# Patient Record
Sex: Male | Born: 1960 | Race: White | Hispanic: No | Marital: Single | Smoking: Former smoker
Health system: Southern US, Community
[De-identification: ages and names within clinical notes are randomized; demographics above are authoritative.]

## PROBLEM LIST (undated history)

## (undated) DIAGNOSIS — IMO0002 Reserved for concepts with insufficient information to code with codable children: Secondary | ICD-10-CM

## (undated) DIAGNOSIS — IMO0001 Reserved for inherently not codable concepts without codable children: Secondary | ICD-10-CM

## (undated) DIAGNOSIS — C801 Malignant (primary) neoplasm, unspecified: Secondary | ICD-10-CM

## (undated) HISTORY — PX: CYST REMOVAL NECK: SHX6281

## (undated) HISTORY — PX: HERNIA REPAIR: SHX51

---

## 2012-01-13 ENCOUNTER — Ambulatory Visit (INDEPENDENT_AMBULATORY_CARE_PROVIDER_SITE_OTHER): Payer: BC Managed Care – PPO | Admitting: Family Medicine

## 2012-01-13 VITALS — BP 100/63 | HR 55 | Temp 98.0°F | Resp 16 | Ht 71.38 in | Wt 167.0 lb

## 2012-01-13 DIAGNOSIS — R21 Rash and other nonspecific skin eruption: Secondary | ICD-10-CM

## 2012-01-13 DIAGNOSIS — L282 Other prurigo: Secondary | ICD-10-CM

## 2012-01-13 LAB — COMPREHENSIVE METABOLIC PANEL
ALT: 9 U/L (ref 0–53)
AST: 14 U/L (ref 0–37)
Albumin: 4.3 g/dL (ref 3.5–5.2)
Alkaline Phosphatase: 48 U/L (ref 39–117)
BUN: 14 mg/dL (ref 6–23)
CO2: 30 mEq/L (ref 19–32)
Calcium: 9.4 mg/dL (ref 8.4–10.5)
Chloride: 103 mEq/L (ref 96–112)
Creat: 0.87 mg/dL (ref 0.50–1.35)
Glucose, Bld: 93 mg/dL (ref 70–99)
Potassium: 4.7 mEq/L (ref 3.5–5.3)
Sodium: 137 mEq/L (ref 135–145)
Total Bilirubin: 0.4 mg/dL (ref 0.3–1.2)
Total Protein: 6.8 g/dL (ref 6.0–8.3)

## 2012-01-13 LAB — POCT CBC
Granulocyte percent: 67.8 %G (ref 37–80)
HCT, POC: 45 % (ref 43.5–53.7)
Hemoglobin: 14 g/dL — AB (ref 14.1–18.1)
Lymph, poc: 1.6 (ref 0.6–3.4)
MCH, POC: 30.7 pg (ref 27–31.2)
MCHC: 31.1 g/dL — AB (ref 31.8–35.4)
MCV: 98.6 fL — AB (ref 80–97)
MID (cbc): 0.6 (ref 0–0.9)
MPV: 9.9 fL (ref 0–99.8)
POC Granulocyte: 4.7 (ref 2–6.9)
POC LYMPH PERCENT: 23.9 %L (ref 10–50)
POC MID %: 8.3 %M (ref 0–12)
Platelet Count, POC: 204 10*3/uL (ref 142–424)
RBC: 4.56 M/uL — AB (ref 4.69–6.13)
RDW, POC: 14.5 %
WBC: 6.9 10*3/uL (ref 4.6–10.2)

## 2012-01-13 MED ORDER — DOXYCYCLINE HYCLATE 100 MG PO CAPS: 100.0000 mg | ORAL_CAPSULE | Freq: Two times a day (BID) | ORAL | Status: AC

## 2012-01-13 NOTE — Progress Notes (Signed)
  Subjective:    Patient ID: Bill Valdez, male    DOB: 1960-11-03, 51 y.o.   MRN: [id number]  HPI 51 year old male presents with 1 week history of a rash around his waist and ankles.  He does admit that he goes outside a lot.  Does not recall any tick bite, contact with poison ivy, or any other insect.  Says that the pruritis is improving but he is concerned because the rash on his right hip increased in size.  Denies headache, abdominal pain, nausea, vomiting, arthralgias, myalgias, fever, or chills.      Review of Systems  All other systems reviewed and are negative.       Objective:   Physical Exam  Constitutional: He is oriented to person, place, and time. He appears well-developed and well-nourished.  HENT:  Head: Normocephalic and atraumatic.  Right Ear: External ear normal.  Left Ear: External ear normal.  Eyes: Conjunctivae are normal.  Neck: Normal range of motion.  Cardiovascular: Normal rate, regular rhythm and normal heart sounds.   Pulmonary/Chest: Effort normal and breath sounds normal.  Neurological: He is alert and oriented to person, place, and time.  Skin:     Psychiatric: He has a normal mood and affect. His behavior is normal. Judgment and thought content normal.          Assessment & Plan:   1. Pruritic rash    2. Rash and nonspecific skin eruption  Rocky mtn spotted fvr ab, IgM-blood, Comprehensive metabolic panel, POCT CBC, B. burgdorfi antibodies, doxycycline (VIBRAMYCIN) 100 MG capsule  Will await RMSF and Lyme titers.  Begin doxycycline 100 mg bid x 14 days Follow up if no improvement or development of headache, nausea, vomiting, abdominal pain, fevers, or chills

## 2012-01-15 LAB — B. BURGDORFI ANTIBODIES: B burgdorferi Ab IgG+IgM: 0.36 {ISR}

## 2012-01-15 LAB — ROCKY MTN SPOTTED FVR AB, IGM-BLOOD: ROCKY MTN SPOTTED FEVER, IGM: 0.2 IV

## 2015-02-10 ENCOUNTER — Other Ambulatory Visit: Payer: Self-pay | Admitting: Family Medicine

## 2015-02-10 ENCOUNTER — Ambulatory Visit
Admission: RE | Admit: 2015-02-10 | Discharge: 2015-02-10 | Disposition: A | Discharge status: Home / Self Care | Payer: BLUE CROSS/BLUE SHIELD | Source: Ambulatory Visit | Attending: Family Medicine | Admitting: Family Medicine

## 2015-02-10 DIAGNOSIS — R0789 Other chest pain: Secondary | ICD-10-CM

## 2015-02-12 ENCOUNTER — Other Ambulatory Visit: Payer: Self-pay | Admitting: Family Medicine

## 2015-02-12 DIAGNOSIS — R918 Other nonspecific abnormal finding of lung field: Secondary | ICD-10-CM

## 2015-02-17 ENCOUNTER — Other Ambulatory Visit: Payer: BLUE CROSS/BLUE SHIELD

## 2015-02-18 ENCOUNTER — Ambulatory Visit
Admission: RE | Admit: 2015-02-18 | Discharge: 2015-02-18 | Disposition: A | Discharge status: Home / Self Care | Payer: BLUE CROSS/BLUE SHIELD | Source: Ambulatory Visit | Attending: Family Medicine | Admitting: Family Medicine

## 2015-02-18 ENCOUNTER — Other Ambulatory Visit (HOSPITAL_COMMUNITY): Payer: Self-pay | Admitting: Family Medicine

## 2015-02-18 DIAGNOSIS — R918 Other nonspecific abnormal finding of lung field: Secondary | ICD-10-CM

## 2015-02-18 DIAGNOSIS — C3411 Malignant neoplasm of upper lobe, right bronchus or lung: Secondary | ICD-10-CM

## 2015-02-18 MED ORDER — IOPAMIDOL (ISOVUE-300) INJECTION 61%
75.0000 mL | Freq: Once | INTRAVENOUS | Status: AC | PRN
  Administered 2015-02-18: 75 mL via INTRAVENOUS

## 2015-02-19 ENCOUNTER — Encounter: Payer: Self-pay | Admitting: *Deleted

## 2015-02-19 ENCOUNTER — Ambulatory Visit
Admission: RE | Admit: 2015-02-19 | Discharge: 2015-02-19 | Disposition: A | Discharge status: Home / Self Care | Payer: BLUE CROSS/BLUE SHIELD | Source: Ambulatory Visit | Attending: Radiation Oncology | Admitting: Radiation Oncology

## 2015-02-19 ENCOUNTER — Encounter: Payer: Self-pay | Admitting: Radiation Oncology

## 2015-02-19 ENCOUNTER — Other Ambulatory Visit: Payer: Self-pay | Admitting: Internal Medicine

## 2015-02-19 ENCOUNTER — Ambulatory Visit (HOSPITAL_BASED_OUTPATIENT_CLINIC_OR_DEPARTMENT_OTHER): Payer: BLUE CROSS/BLUE SHIELD | Admitting: Internal Medicine

## 2015-02-19 ENCOUNTER — Telehealth: Payer: Self-pay | Admitting: Internal Medicine

## 2015-02-19 ENCOUNTER — Telehealth: Payer: Self-pay | Admitting: *Deleted

## 2015-02-19 VITALS — BP 105/71 | HR 91 | Temp 97.6°F | Ht 71.0 in | Wt 163.2 lb

## 2015-02-19 VITALS — BP 137/84 | HR 102 | Resp 18 | Ht 71.0 in | Wt 165.4 lb

## 2015-02-19 DIAGNOSIS — C3411 Malignant neoplasm of upper lobe, right bronchus or lung: Secondary | ICD-10-CM

## 2015-02-19 DIAGNOSIS — Z809 Family history of malignant neoplasm, unspecified: Secondary | ICD-10-CM | POA: Insufficient documentation

## 2015-02-19 DIAGNOSIS — Z510 Encounter for antineoplastic radiation therapy: Secondary | ICD-10-CM | POA: Diagnosis not present

## 2015-02-19 NOTE — Progress Notes (Signed)
Radiation Oncology         [phone] ________________________________  Initial Outpatient Consultation  Name: Bill Valdez MRN: [id number]  Date: 02/19/2015  DOB: Nov 24, 1960  GE:XBMWUXL,KGMWNU R, MD  Curt Bears, MD   REFERRING PHYSICIAN: Curt Bears, MD  DIAGNOSIS: The encounter diagnosis was Cancer of upper lobe of right lung.    ICD-9-CM ICD-10-CM   1. Cancer of upper lobe of right lung 162.3 C34.11     HISTORY OF PRESENT ILLNESS:[name] is a 54 y.o. 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 He was noticing with exertion worsening of a hernia and also began to develop rib pain. He underwent chest x-ray on 02/10/2015. This study demonstrated a nodular 6cm right upper lobe suprahilar mass. This prompted chest CT with contrast on 02/18/2015. The chest CT confirmed a 7.7cm large central right lung mass invading the mediastinum with bulky adenopathy and obstruction of the SVC. The CT scan showed hypo-densities within the liver, highly suspicious for liver metastases.The patient was urgently referred to medical oncology earlier today (02/19/2015) and sent down for emergent evaluation. The patient does note some facial swelling as well as dyspnea. This symptom worsens when he bends forwards or ties his shoes.  PREVIOUS RADIATION THERAPY: No  PAST MEDICAL HISTORY:  has no past medical history on file.    PAST SURGICAL HISTORY: Past Surgical History  Procedure Laterality Date  . Hernia repair    . Cyst removal neck      FAMILY HISTORY: family history includes Cancer in his father.  SOCIAL HISTORY:  Social History   Social History  . Marital Status: Single    Spouse Name: N/A  . Number of Children: 0  . Years of Education: N/A   Occupational History  . Not on file.    Social History Main Topics  . Smoking status: Former Smoker -- 1.50 packs/day for 30 years    Types: Cigarettes    Quit date: 01/13/2011  . Smokeless tobacco: Former Systems developer  . Alcohol Use: No  . Drug Use: No  . Sexual Activity: Not on file   Other Topics Concern  . Not on file   Social History Narrative    ALLERGIES: Penicillins  MEDICATIONS:  No current outpatient prescriptions on file.   No current facility-administered medications for this encounter.    REVIEW OF SYSTEMS:  A 15 point review of systems is documented in the electronic medical record. This was obtained by the nursing staff. However, I reviewed this with the patient to discuss relevant findings and make appropriate changes.  Pertinent items are noted in HPI.   PHYSICAL EXAM:  height is '5\' 11"'$  (1.803 m) and weight is 163 lb 3.2 oz (74.027 kg). His oral temperature is 97.6 F (36.4 C). His blood pressure is 105/71 and his pulse is 91. His oxygen saturation is 99%.  The patient is alert and oriented x3. BP 105/71 mmHg  Pulse 91  Temp(Src) 97.6 F (36.4 C) (Oral)  Ht '5\' 11"'$  (1.803 m)  Wt 163 lb 3.2 oz (74.027 kg)  BMI 22.77 kg/m2  SpO2 99% The patient is no acute distress today or respiratory distress. He may have some minimal facial swelling but no jugular venous distention.  KPS = 90  100 - Normal; no complaints; no evidence of disease. 90   -  Able to carry on normal activity; minor signs or symptoms of disease. 80   - Normal activity with effort; some signs or symptoms of disease. 26   - Cares for self; unable to carry on normal activity or to do active work. 60   - Requires occasional assistance, but is able to care for most of his personal needs. 50   - Requires considerable assistance and frequent medical care. 5   - Disabled; requires special care and assistance. 90   - Severely disabled; hospital admission is indicated although death not imminent. 58   - Very sick; hospital admission necessary;  active supportive treatment necessary. 10   - Moribund; fatal processes progressing rapidly. 0     - Dead  Karnofsky DA, Abelmann [place], Craver LS and Burchenal [facility] [phone]) The use of the nitrogen mustards in the palliative treatment of carcinoma: with particular reference to bronchogenic carcinoma Cancer 1 634-56  LABORATORY DATA:  Lab Results  Component Value Date   WBC 6.9 01/13/2012   HGB 14.0* 01/13/2012   HCT 45.0 01/13/2012   MCV 98.6* 01/13/2012   Lab Results  Component Value Date   NA 137 01/13/2012   K 4.7 01/13/2012   CL 103 01/13/2012   CO2 30 01/13/2012   Lab Results  Component Value Date   ALT 9 01/13/2012   AST 14 01/13/2012   ALKPHOS 48 01/13/2012   BILITOT 0.4 01/13/2012     RADIOGRAPHY: Dg Chest 2 View  02/11/2015   CLINICAL DATA:  Chest pain starting 4 weeks ago  EXAM: CHEST  2 VIEW  COMPARISON:  None.  FINDINGS: Cardiac size is unremarkable. No acute infiltrate or pulmonary edema. There is a nodular mass in right upper lobe suprahilar region measures at least 6 cm. Further correlation with CT scan of the chest with IV contrast is recommended. Minimal compression deformity lower thoracic spine of indeterminate age.  IMPRESSION: No acute infiltrate or pulmonary edema. There is a nodular mass in right upper lobe suprahilar region measures at least 6 cm. Further correlation with CT scan of the chest with IV contrast is recommended. Minimal compression deformity lower thoracic spine of indeterminate age.  This was made a call report.   Electronically Signed   By: Lahoma Crocker M.D.   On: 02/11/2015 08:08   Ct Chest W Contrast  02/18/2015   CLINICAL DATA:  Followup lung mass seen on recent chest x-ray.  EXAM: CT CHEST WITH CONTRAST  TECHNIQUE: Multidetector CT imaging of the chest was performed during intravenous contrast administration.  CONTRAST:  90m ISOVUE-300 IOPAMIDOL (ISOVUE-300) INJECTION 61%  COMPARISON:  Chest x-ray 02/10/2015  FINDINGS: Chest wall: No chest wall  mass. There are borderline enlarged axillary lymph nodes. A 9 mm supraclavicular lymph node is noted on the right on image number 10. A 9 mm right axillary lymph node is noted on image number 17. Small subpectoral lymph nodes are also noted on the right side.  No destructive bone lesions are identified. No sternal, rib or vertebral body lesions. Seventh rib fractures are noted bilaterally which are likely posttraumatic. I do not see any definite underlying pathologic bone lesion. A T12 vertebral body lesion is most likely a benign Schmorl's node or old compression fracture.  Mediastinum: Large central right lung mass invading the mediastinum with bulky adenopathy. There is obstruction and probable invasion of the SVC and tumor or tumor thrombus extending up into the right uvular vein and left brachiocephalic vein. There is also clot or  tumor in the azygos vein. The mass measures approximately 7.7 x 7.6 cm on image number 26. 11 mm subcarinal lymph node is noted. No contralateral hilar adenopathy. The heart is normal in size. No pericardial effusion. The aorta is normal in caliber. No dissection. The branch vessels are patent. The esophagus is grossly normal.  Lungs/pleura: Apical scarring changes with a 12 mm nodular density on image number 14 in the right apex. No findings for pulmonary metastatic disease. No pleural effusion or acute pulmonary abnormality. Mild underlying emphysematous changes.  Upper abdomen: Low-attenuation liver lesions highly concerning for metastatic disease.  15 mm lesion in segment 3 on image number 68.  10 mm lesion in segment 2 B on image 75.  3.5 cm lesion and segment 5 on image number 66  Other smaller lesions are noted.  No metastatic adrenal gland lesions are identified. The visualized pancreas is normal.  IMPRESSION: 1. 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 This is invading the SVC with probable tumor thrombus  extending up the right jugular vein and into the left brachiocephalic vein. There is also thrombus or tumor in the azygos vein. 2. Borderline enlarged supraclavicular and axillary lymph nodes on the right side. 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 3. Findings highly suspicious for liver metastasis. 4. No obvious bone metastasis. Bilateral seventh rib fractures are noted. These results were called by telephone at the time of interpretation on 02/18/2015 at 4:53 pm to Dr. Donavan Burnet   Electronically Signed   By: Marijo Sanes M.D.   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 He may benefit from emergent institution of palliative ative radiotherapy to the upper mediastinum. The patient understands that he can access his appointments and medical records via [place].   PLAN: Today, I talked to the patient and family about the findings and work-up thus far.  We discussed the natural history of superior vena cava syndrome and general treatment, highlighting the role of radiotherapy in the management.  We discussed the available radiation techniques, and focused on the details of logistics and delivery.  We reviewed the anticipated acute and late sequelae associated with radiation in this setting.  The patient was encouraged to ask questions that I answered to the best of my ability.  I filled out a patient counseling form during our discussion including treatment diagrams.  We retained a copy for our records.  The patient would like to proceed with radiation and will be scheduled for CT simulation later today (02/19/2015). We plan a course of six fractions to complete radiotherapy on 02/26/2015. All vocalized questions and concerns have been addressed. If the patient develops any  further questions or concerns in regards to his treatment and recovery, he has been encouraged to contact Dr. Tammi Klippel, MD.  I spent time face to face with the patient and more than 50% of that time was spent in counseling and/or coordination of care.   This document serves as a record of services personally performed by Tyler Pita, MD. It was created on his behalf by Lenn Cal, a trained medical scribe. The creation of this record is based on the scribe's personal observations and the provider's statements  to them. This document has been checked and approved by the attending provider.   ------------------------------------------------  Sheral Apley Tammi Klippel, M.D.

## 2015-02-19 NOTE — Progress Notes (Signed)
  Radiation Oncology         [phone] ________________________________  Name: Bill Valdez MRN: [id number]  Date: 02/19/2015  DOB: 25-Sep-1960  SIMULATION AND TREATMENT PLANNING NOTE    ICD-9-CM ICD-10-CM   1. 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  NARRATIVE:  The patient was brought to the [facility].  Identity was confirmed.  All relevant records and images related to the planned course of therapy were reviewed.  The patient freely provided informed written consent to proceed with treatment after reviewing the details related to the planned course of therapy. The consent form was witnessed and verified by the simulation staff.  Then, the patient was set-up in a stable reproducible  supine position for radiation therapy.  CT images were obtained.  Surface markings were placed.  The CT images were loaded into the planning software.  Then the target and avoidance structures were contoured.  Treatment planning then occurred.  The radiation prescription was entered and confirmed.  Then, I designed and supervised the construction of a total of 3 medically necessary complex treatment devices in the form of a body fix positioner and two multileaf collimators.  I have requested : 3D Simulation  I have requested a DVH of the following structures: left lung, right lung, heart, esophagus, spinal chord, and target.  PLAN:  The patient will receive 4 Gy in 1 fraction today to initiate a rapid response. We will continue with 5 more fractions of 3 Gy next week to deliver a total dose of 19 Gy with treatment completion on 02/26/2015.  This document serves as a record of services personally performed by Tyler Pita, MD. It was created on his behalf by Lenn Cal, a trained medical scribe. The creation of this record is based on the scribe's personal  observations and the provider's statements to them. This document has been checked and approved by the attending provider. ________________________________   Sheral Apley. Tammi Klippel, M.D.

## 2015-02-19 NOTE — Progress Notes (Signed)
Oncology Nurse Navigator Documentation  Oncology Nurse Navigator Flowsheets 02/19/2015  Navigator Encounter Type Clinic/MDC/spoke with patient today at his new patient visit.  I followed up with him regarding information given to him by Dr. Julien Nordmann.  He is set up to see Dr. Julien Nordmann on 02/25/15 for follow up arrive at 4:30.    Patient Visit Type Initial  Treatment Phase Abnormal Scans  Barriers/Navigation Needs Education  Education Other  Interventions Coordination of Care  Coordination of Care -  Time Spent with Patient 30

## 2015-02-19 NOTE — Progress Notes (Signed)
[place] [phone]   [phone]  CONSULT NOTE  REFERRING PHYSICIAN: Dr. Donnie Coffin  REASON FOR CONSULTATION:  54 years old white male with questionable lung cancer.  HPI Bill Valdez is a 54 y.o. male with no significant past medical history except for history of hernia repair 3 years ago as well as history of pneumonia many years ago. The patient also has a history of smoking but quit in 2012. Over the last 2 months the patient has been complaining of pain on the left lower side of his chest that lasted for almost a week but got better on its own. The patient then started having increasing fatigue and shortness of breath. He was seen by his primary care physician and complained about these issues. Chest x-ray was performed on 02/10/2015 and it showed a nodular mass in the right upper lobe suprahilar region measuring at least 6 cm. This was followed by CT scan of the chest on 02/18/2015 and it showed 7.0 cm central right upper lobe lung mass invading the mediastinum highly suspicious for small cell lung cancer. This is invading the SVC with probable tumor thrombus extending up the right jugular vein and into the left brachiocephalic vein. There was also a thrombus or tumor and the azygos vein. There was borderline enlarged supraclavicular and axillary lymph nodes on the right side. 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 There are also findings highly suspicious for liver metastasis. There was no obvious bone metastases but there was bilateral 7th rib fractures. Dr. Alroy Dust kindly referred the patient to me today for evaluation and recommendation regarding his condition. When seen today the patient continues to have cough and central chest pain as well as shortness of breath increased with exertion. He noticed also pressure and his face and neck area as well as swelling of the neck over the  last few days. He continues to complain of arthralgia and his knees. He denied having any significant weight loss or night sweats. He has few episodes of presyncope.   HPI  PAST MEDICAL HISTORY: Remarkable only for pneumonia many years ago and addition to hernia surgery 3 years ago and cyst removed from the neck area. The patient denied having any history of hypertension, diabetes mellitus, coronary artery disease or stroke.   FAMILY HISTORY: Father had liver cancer and mother had back injury. No history of malignancy in his family.   SOCIAL HISTORY: The patient is single and has no children. He moved from [place] to be close to family in [place]. 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 He has a history of smoking up to 2 pack per day for around 30 years and quit 01/14/2011. 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 No history of drug abuse.   Social History Social History  Substance Use Topics  . Smoking status: Former Smoker    Quit date: 01/13/2011  . Smokeless tobacco: Not on file  . Alcohol Use: Not on file    Allergies  Allergen Reactions  . Penicillins Other (See Comments)    childhood    No current outpatient prescriptions on file.   No current facility-administered medications for this visit.    Review of Systems  Constitutional: negative Eyes: negative Ears, nose, mouth, throat, and face: positive for Swelling of the neck Respiratory: positive for cough and  dyspnea on exertion Cardiovascular: negative Gastrointestinal: negative Genitourinary:negative Integument/breast: negative Hematologic/lymphatic: negative Musculoskeletal:positive for arthralgias Neurological: negative Behavioral/Psych: negative Endocrine: negative Allergic/Immunologic: negative  Physical Exam  FBP:ZWCHE, healthy, no distress, well nourished and well developed SKIN: skin color, texture, turgor are normal,  no rashes or significant lesions HEAD: Normocephalic, No masses, lesions, tenderness or abnormalities EYES: normal, PERRLA, Conjunctiva are pink and non-injected EARS: External ears normal, Canals clear OROPHARYNX:no exudate, no erythema and lips, buccal mucosa, and tongue normal  NECK: supple, no adenopathy, no JVD LYMPH:  no palpable lymphadenopathy, no hepatosplenomegaly LUNGS: expiratory wheezes bilaterally, scattered rhonchi bilaterally HEART: regular rate & rhythm, no murmurs and no gallops ABDOMEN:abdomen soft, non-tender, normal bowel sounds and no masses or organomegaly BACK: Back symmetric, no curvature., No CVA tenderness EXTREMITIES:no joint deformities, effusion, or inflammation, no edema, no skin discoloration  NEURO: alert & oriented x 3 with fluent speech, no focal motor/sensory deficits  PERFORMANCE STATUS: ECOG 0  LABORATORY DATA: Lab Results  Component Value Date   WBC 6.9 01/13/2012   HGB 14.0* 01/13/2012   HCT 45.0 01/13/2012   MCV 98.6* 01/13/2012      Chemistry      Component Value Date/Time   NA 137 01/13/2012 1052   K 4.7 01/13/2012 1052   CL 103 01/13/2012 1052   CO2 30 01/13/2012 1052   BUN 14 01/13/2012 1052   CREATININE 0.87 01/13/2012 1052      Component Value Date/Time   CALCIUM 9.4 01/13/2012 1052   ALKPHOS 48 01/13/2012 1052   AST 14 01/13/2012 1052   ALT 9 01/13/2012 1052   BILITOT 0.4 01/13/2012 1052       RADIOGRAPHIC STUDIES: Dg Chest 2 View  02/11/2015   CLINICAL DATA:  Chest pain starting 4 weeks ago  EXAM: CHEST  2 VIEW  COMPARISON:  None.  FINDINGS: Cardiac size is unremarkable. No acute infiltrate or pulmonary edema. There is a nodular mass in right upper lobe suprahilar region measures at least 6 cm. Further correlation with CT scan of the chest with IV contrast is recommended. Minimal compression deformity lower thoracic spine of indeterminate age.  IMPRESSION: No acute infiltrate or pulmonary edema. There is a nodular mass in  right upper lobe suprahilar region measures at least 6 cm. Further correlation with CT scan of the chest with IV contrast is recommended. Minimal compression deformity lower thoracic spine of indeterminate age.  This was made a call report.   Electronically Signed   By: Lahoma Crocker M.D.   On: 02/11/2015 08:08   Ct Chest W Contrast  02/18/2015   CLINICAL DATA:  Followup lung mass seen on recent chest x-ray.  EXAM: CT CHEST WITH CONTRAST  TECHNIQUE: Multidetector CT imaging of the chest was performed during intravenous contrast administration.  CONTRAST:  50m ISOVUE-300 IOPAMIDOL (ISOVUE-300) INJECTION 61%  COMPARISON:  Chest x-ray 02/10/2015  FINDINGS: Chest wall: No chest wall mass. There are borderline enlarged axillary lymph nodes. A 9 mm supraclavicular lymph node is noted on the right on image number 10. A 9 mm right axillary lymph node is noted on image number 17. Small subpectoral lymph nodes are also noted on the right side.  No destructive bone lesions are identified. No sternal, rib or vertebral body lesions. Seventh rib fractures are noted bilaterally which are likely posttraumatic. I do not see any definite underlying pathologic bone lesion. A T12 vertebral body lesion is most likely a benign Schmorl's node or old compression fracture.  Mediastinum: Large central right lung  mass invading the mediastinum with bulky adenopathy. There is obstruction and probable invasion of the SVC and tumor or tumor thrombus extending up into the right uvular vein and left brachiocephalic vein. There is also clot or tumor in the azygos vein. The mass measures approximately 7.7 x 7.6 cm on image number 26. 11 mm subcarinal lymph node is noted. No contralateral hilar adenopathy. The heart is normal in size. No pericardial effusion. The aorta is normal in caliber. No dissection. The branch vessels are patent. The esophagus is grossly normal.  Lungs/pleura: Apical scarring changes with a 12 mm nodular density on image number  14 in the right apex. No findings for pulmonary metastatic disease. No pleural effusion or acute pulmonary abnormality. Mild underlying emphysematous changes.  Upper abdomen: Low-attenuation liver lesions highly concerning for metastatic disease.  15 mm lesion in segment 3 on image number 68.  10 mm lesion in segment 2 B on image 75.  3.5 cm lesion and segment 5 on image number 66  Other smaller lesions are noted.  No metastatic adrenal gland lesions are identified. The visualized pancreas is normal.  IMPRESSION: 1. 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 This is invading the SVC with probable tumor thrombus extending up the right jugular vein and into the left brachiocephalic vein. There is also thrombus or tumor in the azygos vein. 2. Borderline enlarged supraclavicular and axillary lymph nodes on the right side. 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 3. Findings highly suspicious for liver metastasis. 4. No obvious bone metastasis. Bilateral seventh rib fractures are noted. These results were called by telephone at the time of interpretation on 02/18/2015 at 4:53 pm to Dr. Donavan Burnet   Electronically Signed   By: Marijo Sanes M.D.   On: 02/18/2015 16:54    ASSESSMENT: This is a very pleasant 54 years old white male with highly suspicious extensive stage small cell lung cancer pending tissue diagnosis but non-small cell lung cancer is also consideration, presenting with large right upper lobe suprahilar mass in addition to mediastinal lymphadenopathy and highly suspicious liver metastasis.   PLAN: I had a lengthy discussion with the patient today about his current condition. I showed him the images of the CT scan of the chest. We will complete the staging workup by ordering a PET scan in addition to MRI of the brain. I also referred the patient to interventional  radiology for consideration of ultrasound guided core biopsy of one of the liver lesion for tissue diagnosis. This is expected to be performed on 02/22/2015 by Dr. Kathlene Cote. I also spoke to Dr. Tammi Klippel and he kindly agreed to see the patient today for evaluation and consideration of palliative radiotherapy to the large right upper lobe suprahilar mass for relief of the SVC symptoms. I will arrange for the patient to come back for follow-up visit in one week for reevaluation and more detailed discussion of his treatment options based on the final pathology and staging workup. He was advised to call immediately if he has any concerning symptoms in the interval.  The patient voices understanding of current disease status and treatment options and is in agreement with the current care plan.  All questions were answered. The patient knows to call the clinic with any problems, questions or concerns. We can certainly see the patient much sooner if necessary.  Thank you so much for  allowing me to participate in the care of Deanna Artis. I will continue to follow up the patient with you and assist in his care.  I spent 40 minutes counseling the patient face to face. The total time spent in the appointment was 60 minutes.  Disclaimer: This note was dictated with voice recognition software. Similar sounding words can inadvertently be transcribed and may not be corrected upon review.   MOHAMED,MOHAMED K. February 19, 2015, 11:56 AM

## 2015-02-19 NOTE — Progress Notes (Signed)
Please see the Nurse Progress Note in the MD Initial Consult Encounter for this patient. 

## 2015-02-19 NOTE — Telephone Encounter (Signed)
Oncology Nurse Navigator Documentation  Oncology Nurse Navigator Flowsheets 02/19/2015  Referral date to RadOnc/MedOnc 02/19/2015  Navigator Encounter Type Introductory phone call/I received a referral on Mr. Graziani today.  I called to schedule.  He will be seen 02/25/15 arrive at 3:15 after PET scan.  Patient verbalized understanding of appt time and place.   Interventions Coordination of Care  Coordination of Care MD Appointments  Time Spent with Patient 15

## 2015-02-19 NOTE — Progress Notes (Signed)
Thoracic Location of Tumor / Histology: right lung cancer  Patient presented 2 months ago with symptoms of: sharp pain with deep breaths and feeling short of breath.  This lasted for a few days and started to get better.    Tobacco/Marijuana/Snuff/ETOH use: smoked 1.5 - 2 packs a day for 30 years.  Quit in 2012. Deneis etoh, snuff, marijuana use.  Past/Anticipated interventions by cardiothoracic surgery, if any: no  Past/Anticipated interventions by medical oncology, if any: yes with have chemotherapy.  Signs/Symptoms  Weight changes, if any: no  Respiratory complaints, if any: denies shortness of breath, reports having a cough with yellow sputum.  Hemoptysis, if any: no  Pain issues, if any:  Yes - has occasional burning pain in his mid chest, left side that lasts for 20 minutes at a time.  Says this is occuring 2 times per day.  SAFETY ISSUES:  Prior radiation? no  Pacemaker/ICD? no   Possible current pregnancy?no  Is the patient on methotrexate? no  Current Complaints / other details:  Patient went to his family doctor about a hernia issues.  The lung mass was found on a chest x ray and then CT scan.  BP 105/71 mmHg  Pulse 91  Temp(Src) 97.6 F (36.4 C) (Oral)  Ht '5\' 11"'$  (1.803 m)  Wt 163 lb 3.2 oz (74.027 kg)  BMI 22.77 kg/m2  SpO2 99%

## 2015-02-19 NOTE — Telephone Encounter (Signed)
per pof to sch pt appt-per Hinton Dyer she will sch [facility] and [facility]amed MD & lab appt-adv pt central sch will call to sch scans-pt understood

## 2015-02-20 ENCOUNTER — Encounter: Payer: Self-pay | Admitting: Internal Medicine

## 2015-02-21 ENCOUNTER — Other Ambulatory Visit: Payer: Self-pay | Admitting: Radiology

## 2015-02-22 ENCOUNTER — Telehealth: Payer: Self-pay | Admitting: Medical Oncology

## 2015-02-22 ENCOUNTER — Encounter (HOSPITAL_COMMUNITY): Payer: Self-pay

## 2015-02-22 ENCOUNTER — Ambulatory Visit (HOSPITAL_COMMUNITY)
Admission: RE | Admit: 2015-02-22 | Discharge: 2015-02-22 | Disposition: A | Discharge status: Home / Self Care | Payer: BLUE CROSS/BLUE SHIELD | Source: Ambulatory Visit | Attending: Internal Medicine | Admitting: Internal Medicine

## 2015-02-22 ENCOUNTER — Ambulatory Visit
Admission: RE | Admit: 2015-02-22 | Discharge: 2015-02-22 | Disposition: A | Discharge status: Home / Self Care | Payer: BLUE CROSS/BLUE SHIELD | Source: Ambulatory Visit | Attending: Radiation Oncology | Admitting: Radiation Oncology

## 2015-02-22 ENCOUNTER — Other Ambulatory Visit: Payer: Self-pay | Admitting: Medical Oncology

## 2015-02-22 ENCOUNTER — Other Ambulatory Visit: Payer: Self-pay | Admitting: Radiology

## 2015-02-22 DIAGNOSIS — Z87891 Personal history of nicotine dependence: Secondary | ICD-10-CM | POA: Diagnosis not present

## 2015-02-22 DIAGNOSIS — Z880 Allergy status to penicillin: Secondary | ICD-10-CM | POA: Diagnosis not present

## 2015-02-22 DIAGNOSIS — C801 Malignant (primary) neoplasm, unspecified: Secondary | ICD-10-CM | POA: Diagnosis not present

## 2015-02-22 DIAGNOSIS — K769 Liver disease, unspecified: Secondary | ICD-10-CM | POA: Diagnosis present

## 2015-02-22 DIAGNOSIS — Z510 Encounter for antineoplastic radiation therapy: Secondary | ICD-10-CM | POA: Diagnosis not present

## 2015-02-22 DIAGNOSIS — C3411 Malignant neoplasm of upper lobe, right bronchus or lung: Secondary | ICD-10-CM

## 2015-02-22 DIAGNOSIS — C787 Secondary malignant neoplasm of liver and intrahepatic bile duct: Secondary | ICD-10-CM | POA: Insufficient documentation

## 2015-02-22 DIAGNOSIS — R599 Enlarged lymph nodes, unspecified: Secondary | ICD-10-CM | POA: Diagnosis not present

## 2015-02-22 DIAGNOSIS — R918 Other nonspecific abnormal finding of lung field: Secondary | ICD-10-CM | POA: Diagnosis not present

## 2015-02-22 LAB — CBC
HEMATOCRIT: 38.3 % — AB (ref 39.0–52.0)
HEMOGLOBIN: 12.7 g/dL — AB (ref 13.0–17.0)
MCH: 30.5 pg (ref 26.0–34.0)
MCHC: 33.2 g/dL (ref 30.0–36.0)
MCV: 92.1 fL (ref 78.0–100.0)
PLATELETS: 271 10*3/uL (ref 150–400)
RBC: 4.16 MIL/uL — AB (ref 4.22–5.81)
RDW: 12.7 % (ref 11.5–15.5)
WBC: 9.7 10*3/uL (ref 4.0–10.5)

## 2015-02-22 LAB — APTT: APTT: 33 s (ref 24–37)

## 2015-02-22 LAB — PROTIME-INR
INR: 1.22 (ref 0.00–1.49)
PROTHROMBIN TIME: 15.6 s — AB (ref 11.6–15.2)

## 2015-02-22 MED ORDER — GELATIN ABSORBABLE 12-7 MM EX MISC
CUTANEOUS | Status: AC
  Filled 2015-02-22: qty 1

## 2015-02-22 MED ORDER — FENTANYL CITRATE (PF) 100 MCG/2ML IJ SOLN
INTRAMUSCULAR | Status: AC | PRN
Start: 2015-02-22 — End: 2015-02-22
  Administered 2015-02-22: 50 ug via INTRAVENOUS

## 2015-02-22 MED ORDER — SODIUM CHLORIDE 0.9 % IV SOLN
INTRAVENOUS | Status: DC
  Administered 2015-02-22: 14:00:00 via INTRAVENOUS

## 2015-02-22 MED ORDER — MIDAZOLAM HCL 2 MG/2ML IJ SOLN
INTRAMUSCULAR | Status: AC
  Filled 2015-02-22: qty 2

## 2015-02-22 MED ORDER — FENTANYL CITRATE (PF) 100 MCG/2ML IJ SOLN
INTRAMUSCULAR | Status: AC
  Filled 2015-02-22: qty 2

## 2015-02-22 MED ORDER — LIDOCAINE HCL (PF) 1 % IJ SOLN
INTRAMUSCULAR | Status: AC
  Filled 2015-02-22: qty 10

## 2015-02-22 MED ORDER — MIDAZOLAM HCL 2 MG/2ML IJ SOLN
INTRAMUSCULAR | Status: AC | PRN
  Administered 2015-02-22: 2 mg via INTRAVENOUS

## 2015-02-22 NOTE — Telephone Encounter (Signed)
I called central scheduling and they wil schedule MRI . Dr Towana Badger notified.

## 2015-02-22 NOTE — Discharge Instructions (Signed)
Liver Biopsy, Care After °Refer to this sheet in the next few weeks. These instructions provide you with information on caring for yourself after your procedure. Your health care provider may also give you more specific instructions. Your treatment has been planned according to current medical practices, but problems sometimes occur. Call your health care provider if you have any problems or questions after your procedure. °WHAT TO EXPECT AFTER THE PROCEDURE °After your procedure, it is typical to have the following: °· A small amount of discomfort in the area where the biopsy was done and in the right shoulder or shoulder blade. °· A small amount of bruising around the area where the biopsy was done and on the skin over the liver. °· Sleepiness and fatigue for the rest of the day. °HOME CARE INSTRUCTIONS  °· Rest at home for 1-2 days or as directed by your health care provider. °· Have a friend or family member stay with you for at least 24 hours. °· Because of the medicines used during the procedure, you should not do the following things in the first 24 hours: °¨ Drive. °¨ Use machinery. °¨ Be responsible for the care of other people. °¨ Sign legal documents. °¨ Take a bath or shower. °· There are many different ways to close and cover an incision, including stitches, skin glue, and adhesive strips. Follow your health care provider's instructions on: °¨ Incision care. °¨ Bandage (dressing) changes and removal. °¨ Incision closure removal. °· Do not drink alcohol in the first week. °· Do not lift more than 5 pounds or play contact sports for 2 weeks after this test. °· Take medicines only as directed by your health care provider. Do not take medicine containing aspirin or non-steroidal anti-inflammatory medicines such as ibuprofen for 1 week after this test. °· It is your responsibility to get your test results. °SEEK MEDICAL CARE IF:  °· You have increased bleeding from an incision that results in more than a  small spot of blood. °· You have redness, swelling, or increasing pain in any incisions. °· You notice a discharge or a bad smell coming from any of your incisions. °· You have a fever or chills. °SEEK IMMEDIATE MEDICAL CARE IF:  °· You develop swelling, bloating, or pain in your abdomen. °· You become dizzy or faint. °· You develop a rash. °· You are nauseous or vomit. °· You have difficulty breathing, feel short of breath, or feel faint. °· You develop chest pain. °· You have problems with your speech or vision. °· You have trouble balancing or moving your arms or legs. °Document Released: 12/09/2004 Document Revised: 10/06/2013 Document Reviewed: 07/18/2013 °[employer]® Patient Information ©2015 [employer]. This information is not intended to replace advice given to you by your health care provider. Make sure you discuss any questions you have with your health care provider. ° °

## 2015-02-22 NOTE — Procedures (Signed)
Interventional Radiology Procedure Note  Procedure: US guided biopsy of left liver mass.  4 x 18g biopsy to pathology.  Complications: None Recommendations:  - Ok to shower tomorrow - Do not submerge for 7 days - Routine care   Signed,  Dulcy Fanny. Earleen Newport, DO

## 2015-02-22 NOTE — H&P (Signed)
Chief Complaint: Patient was seen in consultation today for liver lesion at the request of [facility]  Referring Physician(s): Mohamed,Mohamed  History of Present Illness: Bill Valdez is a 54 y.o. male who presented on 02/10/15 with complaints of intermittent chest pain approximately 1 month ago CXR revealed RUL lung mass. Follow up CT 02/18/15 with RUL lung mass invading the SVC with thrombus present, lymphadenopathy and liver metastatic disease. The patient has started radiation and has seen Dr. Julien Nordmann and is here today for a liver lesion biopsy with sedation. He denies any chest pain, shortness of breath or palpitations. He denies any active signs of bleeding or excessive bruising. He denies any RUQ pain or nausea. He denies any recent fever or chills. The patient denies any history of sleep apnea or chronic oxygen use. He denies any known complications to sedation.    History reviewed. No pertinent past medical history.  Past Surgical History  Procedure Laterality Date  . Hernia repair    . Cyst removal neck      Allergies: Penicillins  Medications: Prior to Admission medications   Not on File     Family History  Problem Relation Age of Onset  . Cancer Father     liver/kidney    Social History   Social History  . Marital Status: Single    Spouse Name: N/A  . Number of Children: 0  . Years of Education: N/A   Social History Main Topics  . Smoking status: Former Smoker -- 1.50 packs/day for 30 years    Types: Cigarettes    Quit date: 01/13/2011  . Smokeless tobacco: Former Systems developer  . Alcohol Use: No  . Drug Use: No  . Sexual Activity: Not Asked   Other Topics Concern  . None   Social History Narrative   Review of Systems: A 12 point ROS discussed and pertinent positives are indicated in the HPI above.  All other systems are negative.  Review of Systems  Vital Signs: T: 97.6 F, BP: 105/71 mmHg, HR: 91 bpm, O2: 99% RA  Physical Exam    Constitutional: He is oriented to person, place, and time. No distress.  HENT:  Head: Normocephalic and atraumatic.  Cardiovascular: Normal rate and regular rhythm.  Exam reveals no gallop and no friction rub.   No murmur heard. Pulmonary/Chest: Effort normal and breath sounds normal. No respiratory distress. He has no wheezes. He has no rales.  Abdominal: Soft. Bowel sounds are normal.  Neurological: He is alert and oriented to person, place, and time.  Skin: Skin is warm and dry. He is not diaphoretic.    Mallampati Score:  MD Evaluation Airway: WNL Heart: WNL Abdomen: WNL Chest/ Lungs: WNL ASA  Classification: 3 Mallampati/Airway Score: Two  Imaging: Dg Chest 2 View  02/11/2015   CLINICAL DATA:  Chest pain starting 4 weeks ago  EXAM: CHEST  2 VIEW  COMPARISON:  None.  FINDINGS: Cardiac size is unremarkable. No acute infiltrate or pulmonary edema. There is a nodular mass in right upper lobe suprahilar region measures at least 6 cm. Further correlation with CT scan of the chest with IV contrast is recommended. Minimal compression deformity lower thoracic spine of indeterminate age.  IMPRESSION: No acute infiltrate or pulmonary edema. There is a nodular mass in right upper lobe suprahilar region measures at least 6 cm. Further correlation with CT scan of the chest with IV contrast is recommended. Minimal compression deformity lower thoracic spine of indeterminate age.  This was  made a call report.   Electronically Signed   By: Lahoma Crocker M.D.   On: 02/11/2015 08:08   Ct Chest W Contrast  02/18/2015   CLINICAL DATA:  Followup lung mass seen on recent chest x-ray.  EXAM: CT CHEST WITH CONTRAST  TECHNIQUE: Multidetector CT imaging of the chest was performed during intravenous contrast administration.  CONTRAST:  21m ISOVUE-300 IOPAMIDOL (ISOVUE-300) INJECTION 61%  COMPARISON:  Chest x-ray 02/10/2015  FINDINGS: Chest wall: No chest wall mass. There are borderline enlarged axillary lymph  nodes. A 9 mm supraclavicular lymph node is noted on the right on image number 10. A 9 mm right axillary lymph node is noted on image number 17. Small subpectoral lymph nodes are also noted on the right side.  No destructive bone lesions are identified. No sternal, rib or vertebral body lesions. Seventh rib fractures are noted bilaterally which are likely posttraumatic. I do not see any definite underlying pathologic bone lesion. A T12 vertebral body lesion is most likely a benign Schmorl's node or old compression fracture.  Mediastinum: Large central right lung mass invading the mediastinum with bulky adenopathy. There is obstruction and probable invasion of the SVC and tumor or tumor thrombus extending up into the right uvular vein and left brachiocephalic vein. There is also clot or tumor in the azygos vein. The mass measures approximately 7.7 x 7.6 cm on image number 26. 11 mm subcarinal lymph node is noted. No contralateral hilar adenopathy. The heart is normal in size. No pericardial effusion. The aorta is normal in caliber. No dissection. The branch vessels are patent. The esophagus is grossly normal.  Lungs/pleura: Apical scarring changes with a 12 mm nodular density on image number 14 in the right apex. No findings for pulmonary metastatic disease. No pleural effusion or acute pulmonary abnormality. Mild underlying emphysematous changes.  Upper abdomen: Low-attenuation liver lesions highly concerning for metastatic disease.  15 mm lesion in segment 3 on image number 68.  10 mm lesion in segment 2 B on image 75.  3.5 cm lesion and segment 5 on image number 66  Other smaller lesions are noted.  No metastatic adrenal gland lesions are identified. The visualized pancreas is normal.  IMPRESSION: 1. 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 This is invading the SVC with probable tumor thrombus extending up the right jugular vein and into the  left brachiocephalic vein. There is also thrombus or tumor in the azygos vein. 2. Borderline enlarged supraclavicular and axillary lymph nodes on the right side. 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 3. Findings highly suspicious for liver metastasis. 4. No obvious bone metastasis. Bilateral seventh rib fractures are noted. These results were called by telephone at the time of interpretation on 02/18/2015 at 4:53 pm to Dr. LDonavan Burnet  Electronically Signed   By: PMarijo SanesM.D.   On: 02/18/2015 16:54    Labs:  CBC:  Recent Labs  02/22/15 1330  WBC 9.7  HGB 12.7*  HCT 38.3*  PLT 271    COAGS:  Recent Labs  02/22/15 1330  INR 1.22  APTT 33     Assessment and Plan: Chest pain- imaging revealed RUL lung mass CT imaging with RUL lung mass, lymphadenopathy and liver metastasis, PET scheduled for 02/23/15 Seen by Dr. MJulien Nordmannand started radiation.  Scheduled today for image guided liver lesion biopsy with sedation  The patient has been NPO, no blood thinners taken, labs and vitals have been reviewed. Risks and Benefits discussed with the patient including, but not limited to bleeding, infection, damage to adjacent structures or low yield requiring additional tests. All of the patient's questions were answered, patient is agreeable to proceed. Consent signed and in chart.   Thank you for this interesting consult.  I greatly enjoyed meeting Bill Valdez and look forward to participating in their care.  A copy of this report was sent to the requesting provider on this date.  SignedHedy Jacob 02/22/2015, 2:07 PM   I spent a total of 15 Minutes in face to face in clinical consultation, greater than 50% of which was counseling/coordinating care for liver lesion.

## 2015-02-23 ENCOUNTER — Ambulatory Visit (HOSPITAL_COMMUNITY)
Admission: RE | Admit: 2015-02-23 | Discharge: 2015-02-23 | Disposition: A | Discharge status: Home / Self Care | Payer: BLUE CROSS/BLUE SHIELD | Source: Ambulatory Visit | Attending: Family Medicine | Admitting: Family Medicine

## 2015-02-23 ENCOUNTER — Ambulatory Visit
Admission: RE | Admit: 2015-02-23 | Discharge: 2015-02-23 | Disposition: A | Discharge status: Home / Self Care | Payer: BLUE CROSS/BLUE SHIELD | Source: Ambulatory Visit | Attending: Radiation Oncology | Admitting: Radiation Oncology

## 2015-02-23 DIAGNOSIS — C7989 Secondary malignant neoplasm of other specified sites: Secondary | ICD-10-CM | POA: Diagnosis not present

## 2015-02-23 DIAGNOSIS — C3411 Malignant neoplasm of upper lobe, right bronchus or lung: Secondary | ICD-10-CM | POA: Diagnosis not present

## 2015-02-23 DIAGNOSIS — J90 Pleural effusion, not elsewhere classified: Secondary | ICD-10-CM | POA: Insufficient documentation

## 2015-02-23 DIAGNOSIS — Z510 Encounter for antineoplastic radiation therapy: Secondary | ICD-10-CM | POA: Diagnosis not present

## 2015-02-23 DIAGNOSIS — C7951 Secondary malignant neoplasm of bone: Secondary | ICD-10-CM | POA: Diagnosis not present

## 2015-02-23 DIAGNOSIS — C787 Secondary malignant neoplasm of liver and intrahepatic bile duct: Secondary | ICD-10-CM | POA: Diagnosis not present

## 2015-02-23 LAB — GLUCOSE, CAPILLARY: GLUCOSE-CAPILLARY: 81 mg/dL (ref 65–99)

## 2015-02-23 MED ORDER — FLUDEOXYGLUCOSE F - 18 (FDG) INJECTION
8.3000 | Freq: Once | INTRAVENOUS | Status: DC | PRN
  Administered 2015-02-23: 8.3 via INTRAVENOUS
  Filled 2015-02-23: qty 8.3

## 2015-02-23 MED ORDER — RADIAPLEXRX EX GEL
Freq: Once | CUTANEOUS | Status: AC
  Administered 2015-02-19: 17:00:00 via TOPICAL

## 2015-02-23 NOTE — Addendum Note (Signed)
Encounter addended by: Heywood Footman, RN on: 02/23/2015  4:06 PM<BR>     Documentation filed: Chief Complaint Section, Medications, Dx Association, Notes Section, Orders

## 2015-02-23 NOTE — Progress Notes (Signed)
Late entry from 02/19/2015 at 1700. 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 Received patient in the clinic following initial treatment. Patient ambulatory and oriented x 3. Oriented patient to staff and routine of the clinic. Provided patient with [facility]IATION THERAPY AND YOU handbook then, reviewed pertinent information. Educated patient reference potential side effects and management such as, fatigue, skin changes, cough, SOB and throat changes. Provided patient with RADIAPLEX and directed upon use. Encouraged patient to contact staff with future needs. Patient verbalized understanding of all reviewed.

## 2015-02-23 NOTE — Addendum Note (Signed)
Encounter addended by: Heywood Footman, RN on: 02/23/2015  4:11 PM<BR>     Documentation filed: Inpatient MAR

## 2015-02-23 NOTE — Addendum Note (Signed)
Encounter addended by: Heywood Footman, RN on: 02/23/2015  1:41 PM<BR>     Documentation filed: Charges VN

## 2015-02-24 ENCOUNTER — Ambulatory Visit
Admission: RE | Admit: 2015-02-24 | Discharge: 2015-02-24 | Disposition: A | Discharge status: Home / Self Care | Payer: BLUE CROSS/BLUE SHIELD | Source: Ambulatory Visit | Attending: Internal Medicine | Admitting: Internal Medicine

## 2015-02-24 ENCOUNTER — Other Ambulatory Visit: Payer: BLUE CROSS/BLUE SHIELD

## 2015-02-24 ENCOUNTER — Ambulatory Visit
Admission: RE | Admit: 2015-02-24 | Discharge: 2015-02-24 | Disposition: A | Discharge status: Home / Self Care | Payer: BLUE CROSS/BLUE SHIELD | Source: Ambulatory Visit | Attending: Radiation Oncology | Admitting: Radiation Oncology

## 2015-02-24 DIAGNOSIS — Z510 Encounter for antineoplastic radiation therapy: Secondary | ICD-10-CM | POA: Diagnosis not present

## 2015-02-24 DIAGNOSIS — C3411 Malignant neoplasm of upper lobe, right bronchus or lung: Secondary | ICD-10-CM

## 2015-02-24 MED ORDER — GADOBENATE DIMEGLUMINE 529 MG/ML IV SOLN
15.0000 mL | Freq: Once | INTRAVENOUS | Status: AC | PRN
  Administered 2015-02-24: 15 mL via INTRAVENOUS

## 2015-02-25 ENCOUNTER — Other Ambulatory Visit: Payer: Self-pay | Admitting: Internal Medicine

## 2015-02-25 ENCOUNTER — Encounter (HOSPITAL_COMMUNITY): Payer: BLUE CROSS/BLUE SHIELD

## 2015-02-25 ENCOUNTER — Other Ambulatory Visit (HOSPITAL_BASED_OUTPATIENT_CLINIC_OR_DEPARTMENT_OTHER): Payer: BLUE CROSS/BLUE SHIELD

## 2015-02-25 ENCOUNTER — Telehealth: Payer: Self-pay | Admitting: Internal Medicine

## 2015-02-25 ENCOUNTER — Ambulatory Visit (HOSPITAL_BASED_OUTPATIENT_CLINIC_OR_DEPARTMENT_OTHER): Payer: BLUE CROSS/BLUE SHIELD | Admitting: Internal Medicine

## 2015-02-25 ENCOUNTER — Ambulatory Visit
Admission: RE | Admit: 2015-02-25 | Discharge: 2015-02-25 | Disposition: A | Discharge status: Home / Self Care | Payer: BLUE CROSS/BLUE SHIELD | Source: Ambulatory Visit | Attending: Radiation Oncology | Admitting: Radiation Oncology

## 2015-02-25 VITALS — BP 118/68 | HR 100 | Temp 97.7°F | Resp 19 | Ht 71.0 in | Wt 165.3 lb

## 2015-02-25 DIAGNOSIS — C3411 Malignant neoplasm of upper lobe, right bronchus or lung: Secondary | ICD-10-CM

## 2015-02-25 DIAGNOSIS — Z510 Encounter for antineoplastic radiation therapy: Secondary | ICD-10-CM | POA: Diagnosis not present

## 2015-02-25 LAB — CBC WITH DIFFERENTIAL/PLATELET
BASO%: 0.2 % (ref 0.0–2.0)
Basophils Absolute: 0 10*3/uL (ref 0.0–0.1)
EOS%: 1.3 % (ref 0.0–7.0)
Eosinophils Absolute: 0.1 10*3/uL (ref 0.0–0.5)
HEMATOCRIT: 36.5 % — AB (ref 38.4–49.9)
HEMOGLOBIN: 12.4 g/dL — AB (ref 13.0–17.1)
LYMPH#: 0.7 10*3/uL — AB (ref 0.9–3.3)
LYMPH%: 7.9 % — ABNORMAL LOW (ref 14.0–49.0)
MCH: 31.1 pg (ref 27.2–33.4)
MCHC: 34 g/dL (ref 32.0–36.0)
MCV: 91.5 fL (ref 79.3–98.0)
MONO#: 0.9 10*3/uL (ref 0.1–0.9)
MONO%: 11.1 % (ref 0.0–14.0)
NEUT%: 79.5 % — ABNORMAL HIGH (ref 39.0–75.0)
NEUTROS ABS: 6.6 10*3/uL — AB (ref 1.5–6.5)
Platelets: 292 10*3/uL (ref 140–400)
RBC: 3.99 10*6/uL — ABNORMAL LOW (ref 4.20–5.82)
RDW: 12.5 % (ref 11.0–14.6)
WBC: 8.4 10*3/uL (ref 4.0–10.3)

## 2015-02-25 LAB — COMPREHENSIVE METABOLIC PANEL (CC13)
ALBUMIN: 3.3 g/dL — AB (ref 3.5–5.0)
AST: 23 U/L (ref 5–34)
Alkaline Phosphatase: 87 U/L (ref 40–150)
Anion Gap: 8 mEq/L (ref 3–11)
BILIRUBIN TOTAL: 0.41 mg/dL (ref 0.20–1.20)
BUN: 13.8 mg/dL (ref 7.0–26.0)
CALCIUM: 9.3 mg/dL (ref 8.4–10.4)
CHLORIDE: 102 meq/L (ref 98–109)
CO2: 24 mEq/L (ref 22–29)
CREATININE: 0.7 mg/dL (ref 0.7–1.3)
EGFR: >90 mL/min/{1.73_m2} (ref >90)
Glucose: 92 mg/dl (ref 70–140)
Potassium: 4.4 mEq/L (ref 3.5–5.1)
Sodium: 135 mEq/L — ABNORMAL LOW (ref 136–145)
TOTAL PROTEIN: 7.5 g/dL (ref 6.4–8.3)

## 2015-02-25 MED ORDER — LIDOCAINE-PRILOCAINE 2.5-2.5 % EX CREA: 1.0000 "application " | TOPICAL_CREAM | CUTANEOUS | Status: AC | PRN

## 2015-02-25 MED ORDER — PROCHLORPERAZINE MALEATE 10 MG PO TABS: 10.0000 mg | ORAL_TABLET | Freq: Four times a day (QID) | ORAL | Status: AC | PRN

## 2015-02-25 NOTE — Progress Notes (Signed)
[place] [phone]   [phone]  OFFICE PROGRESS NOTE  [name], MD 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  PRIOR THERAPY: Currently undergoing palliative radiotherapy to the large right upper lobe lung mass under the care of Dr. Tammi Klippel  CURRENT THERAPY: Systemic chemotherapy with carboplatin for AUC of 5 on day 1 and Abraxane 100 MG/M2 on days 1, 8 and 15 every 3 weeks. First dose is expected on 03/09/2015.  INTERVAL HISTORY: Bill Valdez 54 y.o. male returns to the clinic today for follow-up visit accompanied by his sister and his brother-in-law who is a neurologist in [facility]. The patient is currently undergoing palliative radiotherapy to the right upper lobe lung mass under the care of Dr. Tammi Klippel status post 4 fractions. He is feeling a little bit better with less congestion in the neck area. The patient also complained of pain in the hip area more on the right than the left. He denied having any significant chest pain or shortness of breath. He has no significant weight loss or night sweats. No headache or visual changes. The patient recently had several studies performed including a PET scan as well as MRI of the brain in addition to ultrasound-guided core biopsy of one of the liver lesion by interventional radiology. The final pathology (Accession: [id number]) was consistent with metastatic poorly differentiated squamous cell carcinoma. Tumor demonstrates the following immunophenotype: Cytokeratin AE1/3 - diffuse moderate strong expression.Cytokeratin 5/6 - diffuse moderate strong expression. CD56 - negative expression. Synaptophysin -  negative expression. TTF-1 - negative expression. p63 - strong diffuse expression. Overall the morphology and immunophenotype are that of metastatic poorly differentiated squamous cell carcinoma. The patient is here today for evaluation and discussion of his treatment options.  MEDICAL HISTORY:No past medical history on file.  ALLERGIES:  is allergic to penicillins.  MEDICATIONS:  Current Outpatient Prescriptions  Medication Sig Dispense Refill  . Wound Cleansers (RADIAPLEX EX) Apply topically.     No current facility-administered medications for this visit.   Facility-Administered Medications Ordered in Other Visits  Medication Dose Route Frequency Slayton Lubitz Last Rate Last Dose  . fludeoxyglucose F - 18 (FDG) injection 8.3 milli Curie  8.3 milli Curie Intravenous Once PRN Medication Radiologist, MD   8.3 milli Curie at 02/23/15 0905    SURGICAL HISTORY:  Past Surgical History  Procedure Laterality Date  . Hernia repair    . Cyst removal neck      REVIEW OF SYSTEMS:  Constitutional: negative Eyes: negative Ears, nose, mouth, throat, and face: negative Respiratory: positive for cough Cardiovascular: negative Gastrointestinal: negative Genitourinary:negative Integument/breast: negative Hematologic/lymphatic: negative Musculoskeletal:positive for back pain and bone pain Neurological: negative Behavioral/Psych: negative Endocrine: negative Allergic/Immunologic: negative   PHYSICAL EXAMINATION: General appearance: alert, cooperative and no distress Head: Normocephalic, without obvious abnormality, atraumatic Neck: no adenopathy, no JVD, supple, symmetrical, trachea midline and thyroid not enlarged, symmetric, no tenderness/mass/nodules Lymph nodes: Cervical, supraclavicular, and axillary nodes normal. Resp: clear to auscultation bilaterally Back: symmetric, no curvature. ROM normal. No CVA tenderness. Cardio: regular rate and rhythm, S1, S2 normal, no murmur, click, rub or  gallop GI: soft, non-tender; bowel sounds normal; no masses,  no organomegaly Extremities: extremities normal, atraumatic, no cyanosis or edema  Neurologic: Alert and oriented X 3, normal strength and tone. Normal symmetric reflexes. Normal coordination and gait  ECOG PERFORMANCE STATUS: 1 - Symptomatic but completely ambulatory  Blood pressure 118/68, pulse 100, temperature 97.7 F (36.5 C), temperature source Oral, resp. rate 19, height '5\' 11"'$  (1.803 m), weight 165 lb 4.8 oz (74.98 kg), SpO2 96 %.  LABORATORY DATA: Lab Results  Component Value Date   WBC 9.7 02/22/2015   HGB 12.7* 02/22/2015   HCT 38.3* 02/22/2015   MCV 92.1 02/22/2015   PLT 271 02/22/2015      Chemistry      Component Value Date/Time   NA 137 01/13/2012 1052   K 4.7 01/13/2012 1052   CL 103 01/13/2012 1052   CO2 30 01/13/2012 1052   BUN 14 01/13/2012 1052   CREATININE 0.87 01/13/2012 1052      Component Value Date/Time   CALCIUM 9.4 01/13/2012 1052   ALKPHOS 48 01/13/2012 1052   AST 14 01/13/2012 1052   ALT 9 01/13/2012 1052   BILITOT 0.4 01/13/2012 1052       RADIOGRAPHIC STUDIES: Dg Chest 2 View  02/11/2015   CLINICAL DATA:  Chest pain starting 4 weeks ago  EXAM: CHEST  2 VIEW  COMPARISON:  None.  FINDINGS: Cardiac size is unremarkable. No acute infiltrate or pulmonary edema. There is a nodular mass in right upper lobe suprahilar region measures at least 6 cm. Further correlation with CT scan of the chest with IV contrast is recommended. Minimal compression deformity lower thoracic spine of indeterminate age.  IMPRESSION: No acute infiltrate or pulmonary edema. There is a nodular mass in right upper lobe suprahilar region measures at least 6 cm. Further correlation with CT scan of the chest with IV contrast is recommended. Minimal compression deformity lower thoracic spine of indeterminate age.  This was made a call report.   Electronically Signed   By: Lahoma Crocker M.D.   On: 02/11/2015 08:08   Ct Chest  W Contrast  02/18/2015   CLINICAL DATA:  Followup lung mass seen on recent chest x-ray.  EXAM: CT CHEST WITH CONTRAST  TECHNIQUE: Multidetector CT imaging of the chest was performed during intravenous contrast administration.  CONTRAST:  92m ISOVUE-300 IOPAMIDOL (ISOVUE-300) INJECTION 61%  COMPARISON:  Chest x-ray 02/10/2015  FINDINGS: Chest wall: No chest wall mass. There are borderline enlarged axillary lymph nodes. A 9 mm supraclavicular lymph node is noted on the right on image number 10. A 9 mm right axillary lymph node is noted on image number 17. Small subpectoral lymph nodes are also noted on the right side.  No destructive bone lesions are identified. No sternal, rib or vertebral body lesions. Seventh rib fractures are noted bilaterally which are likely posttraumatic. I do not see any definite underlying pathologic bone lesion. A T12 vertebral body lesion is most likely a benign Schmorl's node or old compression fracture.  Mediastinum: Large central right lung mass invading the mediastinum with bulky adenopathy. There is obstruction and probable invasion of the SVC and tumor or tumor thrombus extending up into the right uvular vein and left brachiocephalic vein. There is also clot or tumor in the azygos vein. The mass measures approximately 7.7 x 7.6 cm on image number 26. 11 mm subcarinal lymph node is noted. No contralateral hilar adenopathy. The heart is normal in size. No pericardial effusion. The aorta is normal in caliber. No dissection. The branch vessels are patent. The esophagus is grossly normal.  Lungs/pleura: Apical scarring changes with  a 12 mm nodular density on image number 14 in the right apex. No findings for pulmonary metastatic disease. No pleural effusion or acute pulmonary abnormality. Mild underlying emphysematous changes.  Upper abdomen: Low-attenuation liver lesions highly concerning for metastatic disease.  15 mm lesion in segment 3 on image number 68.  10 mm lesion in segment 2 B  on image 75.  3.5 cm lesion and segment 5 on image number 66  Other smaller lesions are noted.  No metastatic adrenal gland lesions are identified. The visualized pancreas is normal.  IMPRESSION: 1. 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 This is invading the SVC with probable tumor thrombus extending up the right jugular vein and into the left brachiocephalic vein. There is also thrombus or tumor in the azygos vein. 2. Borderline enlarged supraclavicular and axillary lymph nodes on the right side. 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 3. Findings highly suspicious for liver metastasis. 4. No obvious bone metastasis. Bilateral seventh rib fractures are noted. These results were called by telephone at the time of interpretation on 02/18/2015 at 4:53 pm to Dr. Donavan Burnet   Electronically Signed   By: Marijo Sanes M.D.   On: 02/18/2015 16:54   Mr Jeri Cos CX Contrast  02/24/2015   CLINICAL DATA:  Recently diagnosed right upper lobe lung cancer. Staging. No reported neurologic complaints.  EXAM: MRI HEAD WITHOUT AND WITH CONTRAST  TECHNIQUE: Multiplanar, multiecho pulse sequences of the brain and surrounding structures were obtained without and with intravenous contrast.  CONTRAST:  8m MULTIHANCE GADOBENATE DIMEGLUMINE 529 MG/ML IV SOLN  COMPARISON:  None.  FINDINGS: There is no evidence of acute infarct, intracranial hemorrhage, mass, midline shift, or extra-axial fluid collection. Ventricles and sulci are normal. Punctate foci of T2 hyperintensity scattered throughout the predominantly subcortical cerebral white matter bilaterally are nonspecific but may reflect minimal chronic small vessel ischemic disease.  A small developmental venous anomaly is noted in the right frontal lobe. There is a punctate, 3 mm focus of cortical enhancement in the right frontal lobe (series 16,  image 100). There is no associated edema. No other foci of abnormal brain parenchymal enhancement are identified. There is no abnormal meningeal enhancement.  Orbits are unremarkable. There are trace right and moderate left mastoid effusions. Paranasal sinuses are clear. Major intracranial vascular flow voids are preserved. There is an approximately 12 mm lesion in the left parietal skull (series 16, image 128 and series 17, image 17).  IMPRESSION: 1. Single 3 mm focus of enhancement involving right frontal cortex, suspicious for a tiny solitary metastasis although a benign etiology such as tiny vascular anomaly is also possible. Attention on close follow-up. 2. 12 mm left parietal skull lesion suspicious for a small osseous metastasis.   Electronically Signed   By: ALogan BoresM.D.   On: 02/24/2015 17:35   Nm Pet Image Initial (pi) Skull Base To Thigh  02/23/2015   CLINICAL DATA:  Initial treatment strategy for metastatic lung cancer. Liver biopsy yesterday.  EXAM: NUCLEAR MEDICINE PET SKULL BASE TO THIGH  TECHNIQUE: 8.3 mCi F-18 FDG was injected intravenously. Full-ring PET imaging was performed from the skull base to thigh after the radiotracer. CT data was obtained and used for attenuation correction and anatomic localization.  FASTING BLOOD GLUCOSE:  Value: 81 mg/dl  COMPARISON:  Chest CT 02/18/2015  FINDINGS: NECK  No hypermetabolic  cervical lymph nodes are identified.There are no lesions of the pharyngeal mucosal space.  CHEST  The large central right upper lobe mass invading the mediastinum is hypermetabolic. This mass measures approximately 8.6 x 7.8 cm on image 80 and demonstrates mild central necrosis within SUV max of 22.9. Apart from the dominant mass, there is no hypermetabolic nodal activity within the chest. Specifically, there is no abnormal activity within the subcarinal region. There is no definite intravascular extension of hypermetabolic activity. There is no suspicious peripheral pulmonary  activity. A small right pleural effusion has developed without abnormal metabolic activity.  ABDOMEN/PELVIS  There are at least 5 hypermetabolic hepatic metastases. The biopsied 2.0 cm lesion in the left hepatic lobe has an SUV max of 10.0. 3.1 cm lesion in the posterior segment of the right hepatic lobe on image 125 has an SUV max of 8.1. There is no abnormal metabolic activity within the adrenal glands, spleen or pancreas. There are no hypermetabolic abdominopelvic lymph nodes.  SKELETON  There are innumerable hypermetabolic osseous metastases. The individual lesions are not well seen on the CT images, without significant lytic component or pathologic fracture. Representative lesions include a left occipital lesion, a lesion within the L1 vertebral body with an SUV max of 18.8, and a large right iliac lesion with an SUV max of 13.2. There is a hypermetabolic soft tissue nodule within the left anterior chest wall just lateral to the left nipple. This measures 1.4 cm on image 99 and has an SUV max of 19.3.  IMPRESSION: 1. The dominant central right upper lobe/mediastinal mass is hypermetabolic with central necrosis. Apart from the mass, no hypermetabolic nodal metastases demonstrated. 2. Multifocal hepatic metastatic disease. 3. Multifocal osseous metastatic disease, occult by CT. 4. Soft tissue metastasis to the left anterior chest wall. 5. 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   Electronically Signed   By: Richardean Sale M.D.   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 Evidence of liver metastases and he has been referred for percutaneous biopsy P  EXAM: ULTRASOUND GUIDED CORE BIOPSY OF LIVER MASS  MEDICATIONS: 2.0 mg IV Versed; 100 mcg IV Fentanyl  Total Moderate Sedation Time: 15  PROCEDURE: The procedure, risks, benefits, and alternatives were  explained to the patient. Questions regarding the procedure were encouraged and answered. The patient understands and consents to the procedure.  Patient position in the supine position on a stretcher. Ultrasound survey of the upper abdomen performed with images stored and sent to PACs.  The epigastric region was prepped with Betadine in a sterile fashion, and a sterile drape was applied covering the operative field. A sterile gown and sterile gloves were used for the procedure. Local anesthesia was provided with 1% Lidocaine.  Once the patient is prepped and draped in usual sterile fashion, the skin and subcutaneous tissues were generously infiltrated with 1% lidocaine for local anesthesia to the liver capsule. A small stab incision was made with 11 blade scalpel, in a 17 gauge guide needle was advanced into lesion of the left liver lobe.  Once the needle tip was confirmed in position, the stylet was removed, and 4 separate 18 gauge core biopsy were achieved. Specimen placed into saline.  Three Gel-Foam pledgets were infused with a small amount of saline.  Patient tolerated the procedure well and remained hemodynamically stable throughout.  Ultrasound survey  after removal of the needle was performed.  No complications encountered.  No significant blood loss.  COMPLICATIONS: None.  FINDINGS: Ultrasound survey demonstrates several liver lesions. Selected lesion for biopsy was within the left liver lobe.  Images during the case demonstrate tip of the needle within the lesion.  Final image demonstrates gas secondary to Gel-Foam pledgets with no complicating features.  IMPRESSION: Status post ultrasound-guided biopsy of left liver lobe lesion. Tissue specimen sent to pathology for complete histopathologic analysis.  Signed,  Dulcy Fanny. Earleen Newport, DO  [facility]  [facility]   Electronically Signed   By: Corrie Mckusick D.O.   On: 02/22/2015 17:23    ASSESSMENT AND PLAN: This  is a very pleasant 54 years old white male recently diagnosed with stage IV non-small cell lung cancer, poorly differentiated squamous cell carcinoma presented with large suprahilar right upper lobe mass in addition to mediastinal lymphadenopathy as well as extensive liver and bone disease involving the spines, hips, ribs, skull and subcutaneous nodule in the left chest wall. The patient also has a suspicious 3 mm lesion in the brain that need close follow-up. I had a lengthy and extensive discussion with the patient and his family today about his current disease stage, prognosis and treatment options. I recommended for the patient to continue his current treatment with palliative radiotherapy to the large right upper lobe lung mass and I also recommended for the patient to discuss with Dr. Tammi Klippel consideration of palliative radiotherapy to the metastatic bone lesion in the hip area. The patient may benefit from orthopedic evaluation at some point. He was also advised to avoid any heavy lifting.  I discussed with the patient his treatment options including palliative care and hospice referral versus consideration of systemic chemotherapy. He is interested in treatment and I gave him 2 options for first line treatment including treatment with carboplatin for AUC of 5 on day 1 and Abraxane 100 MG/M2 on days 1, 8 and 15 every 3 weeks versus the second regimen with carboplatin for AUC of 5 on day 1, gemcitabine 1000 MG/M2 on days 1 and 8 plus/minus Portrazza 800 mg on days 1 and 8 every 3 weeks. I had a lengthy discussion with the patient and his family about these 2 regimen and the adverse effects including but not limited to alopecia, nausea and vomiting, myelosuppression, peripheral neuropathy, liver or renal dysfunction. I also discussed with the patient the adverse effect of Portrazza including skin rash, diarrhea, hypomagnesemia and risk of cardiac arrest. The patient is interested in proceeding with  treatment with carboplatin and Abraxane. I will schedule him to start the first dose of this treatment on 03/09/2015 as the patient and his family are interested in a second opinion. I will arrange for him to see Dr. Durenda Hurt or Dr. Sharlet Salina at [facility] next week for a second opinion. For the bone disease, I requested the patient to have a dental clearance before starting him on treatment with Xgeva. He was also advised to take calcium supplements on daily basis. The patient would come back for follow-up visit in 2 weeks for reevaluation and management of any adverse effect of his treatment. He was advised to call immediately if he has any concerning symptoms in the interval. I gave the patient and his family the time to ask questions and I answered them completely to their satisfaction. The patient voices understanding of current disease status and treatment options and is in agreement with the  current care plan.  All questions were answered. The patient knows to call the clinic with any problems, questions or concerns. We can certainly see the patient much sooner if necessary.  I spent 45 minutes counseling the patient face to face. The total time spent in the appointment was 60 minutes.  Disclaimer: This note was dictated with voice recognition software. Similar sounding words can inadvertently be transcribed and may not be corrected upon review.

## 2015-02-25 NOTE — Telephone Encounter (Signed)
Gave pt AVS and will schedule pt tomorrow this was after hours... KJ

## 2015-02-26 ENCOUNTER — Encounter: Payer: Self-pay | Admitting: Radiation Oncology

## 2015-02-26 ENCOUNTER — Ambulatory Visit
Admission: RE | Admit: 2015-02-26 | Discharge: 2015-02-26 | Disposition: A | Discharge status: Home / Self Care | Payer: BLUE CROSS/BLUE SHIELD | Source: Ambulatory Visit | Attending: Radiation Oncology | Admitting: Radiation Oncology

## 2015-02-26 ENCOUNTER — Other Ambulatory Visit (HOSPITAL_BASED_OUTPATIENT_CLINIC_OR_DEPARTMENT_OTHER): Payer: BLUE CROSS/BLUE SHIELD

## 2015-02-26 ENCOUNTER — Telehealth: Payer: Self-pay | Admitting: Medical Oncology

## 2015-02-26 ENCOUNTER — Telehealth: Payer: Self-pay | Admitting: *Deleted

## 2015-02-26 ENCOUNTER — Telehealth: Payer: Self-pay | Admitting: Internal Medicine

## 2015-02-26 VITALS — BP 117/70 | HR 89 | Resp 16 | Wt 165.3 lb

## 2015-02-26 DIAGNOSIS — C3411 Malignant neoplasm of upper lobe, right bronchus or lung: Secondary | ICD-10-CM | POA: Diagnosis not present

## 2015-02-26 DIAGNOSIS — C7931 Secondary malignant neoplasm of brain: Secondary | ICD-10-CM | POA: Insufficient documentation

## 2015-02-26 DIAGNOSIS — C7951 Secondary malignant neoplasm of bone: Secondary | ICD-10-CM

## 2015-02-26 DIAGNOSIS — C719 Malignant neoplasm of brain, unspecified: Secondary | ICD-10-CM

## 2015-02-26 DIAGNOSIS — Z510 Encounter for antineoplastic radiation therapy: Secondary | ICD-10-CM | POA: Diagnosis not present

## 2015-02-26 LAB — COMPREHENSIVE METABOLIC PANEL (CC13)
ANION GAP: 8 meq/L (ref 3–11)
AST: 21 U/L (ref 5–34)
Albumin: 3.2 g/dL — ABNORMAL LOW (ref 3.5–5.0)
Alkaline Phosphatase: 82 U/L (ref 40–150)
BUN: 10.1 mg/dL (ref 7.0–26.0)
CHLORIDE: 101 meq/L (ref 98–109)
CO2: 26 meq/L (ref 22–29)
CREATININE: 0.8 mg/dL (ref 0.7–1.3)
Calcium: 9.3 mg/dL (ref 8.4–10.4)
EGFR: >90 mL/min/{1.73_m2} (ref >90)
Glucose: 87 mg/dl (ref 70–140)
Potassium: 4.2 mEq/L (ref 3.5–5.1)
Sodium: 134 mEq/L — ABNORMAL LOW (ref 136–145)
Total Bilirubin: 0.61 mg/dL (ref 0.20–1.20)
Total Protein: 7.1 g/dL (ref 6.4–8.3)

## 2015-02-26 LAB — CBC WITH DIFFERENTIAL/PLATELET
BASO%: 0.4 % (ref 0.0–2.0)
Basophils Absolute: 0 10*3/uL (ref 0.0–0.1)
EOS%: 0.8 % (ref 0.0–7.0)
Eosinophils Absolute: 0.1 10*3/uL (ref 0.0–0.5)
HCT: 36.6 % — ABNORMAL LOW (ref 38.4–49.9)
HGB: 12.2 g/dL — ABNORMAL LOW (ref 13.0–17.1)
LYMPH%: 5.3 % — AB (ref 14.0–49.0)
MCH: 30.7 pg (ref 27.2–33.4)
MCHC: 33.3 g/dL (ref 32.0–36.0)
MCV: 92 fL (ref 79.3–98.0)
MONO#: 0.8 10*3/uL (ref 0.1–0.9)
MONO%: 10.8 % (ref 0.0–14.0)
NEUT#: 6.5 10*3/uL (ref 1.5–6.5)
NEUT%: 82.7 % — AB (ref 39.0–75.0)
PLATELETS: 289 10*3/uL (ref 140–400)
RBC: 3.98 10*6/uL — AB (ref 4.20–5.82)
RDW: 12.8 % (ref 11.0–14.6)
WBC: 7.9 10*3/uL (ref 4.0–10.3)
lymph#: 0.4 10*3/uL — ABNORMAL LOW (ref 0.9–3.3)

## 2015-02-26 MED ORDER — SODIUM CHLORIDE 0.9 % IJ SOLN
10.0000 mL | Freq: Once | INTRAMUSCULAR | Status: AC
  Administered 2015-02-26: 10 mL via INTRAVENOUS

## 2015-02-26 NOTE — Progress Notes (Signed)
Weight and vitals stable. Denies pain at this time. Reports intermittent brief stabbing right ear pain. Denies headache, dizziness, nausea, vomiting, diplopia or ringing in the ears. Reports a more frequent dry cough. Denies SOB. Denies difficulty swallowing. Denies skin changes within treatment field of chest. Reports using Radiaplex as directed. Denies confusion or decline of short term memory. Steady gait noted. Denies any change in his coordination. Here today with his sister.   BP 117/70 mmHg  Pulse 89  Resp 16  Wt 165 lb 4.8 oz (74.98 kg)  SpO2 100% Wt Readings from Last 3 Encounters:  02/26/15 165 lb 4.8 oz (74.98 kg)  02/25/15 165 lb 4.8 oz (74.98 kg)  02/22/15 165 lb (74.844 kg)

## 2015-02-26 NOTE — Telephone Encounter (Signed)
Per staff message and POF I have scheduled appts. Advised scheduler of appts. JMW  

## 2015-02-26 NOTE — Telephone Encounter (Signed)
s.w. pt and advised on todays appts.Marland Kitchen.he will come by today to pick up

## 2015-02-26 NOTE — Progress Notes (Signed)
  Radiation Oncology         [phone] ________________________________  Name: Bill Valdez MRN: [id number]  Date: 02/26/2015  DOB: 1961/03/04   Weekly Radiation Therapy Management    ICD-9-CM ICD-10-CM   1. Cancer of upper lobe of right lung 162.3 C34.11     Current Dose: 19 Gy     Planned Dose:  19 Gy  Narrative . . . . . . . . The patient presents for the final under treatment assessment. The patient has had some continuation of previously noted symptoms. The pt reports intermittent brief stabbing right ear pain. He also has left shoulder, right leg, left knee, and left hip pain. He denies taking any pain medication at this time. He denies headache, dizziness, nausea, vomiting, diplopia, ringing in the ears, sob, difficulty swallowing, or skin changes within the treatment field of the chest. Reports a more frequent, dry, cough. Reports using Radiaplex as directed. Denies confusion, decline of short term memory, or change in his coordination. Steady gait noted. He presents to the clinic today with his sister.                                 Set-up films were reviewed.                                 The chart was checked.  Current Outpatient Prescriptions on File Prior to Encounter  Medication Sig Dispense Refill  . Wound Cleansers (RADIAPLEX EX) Apply topically.    . lidocaine-prilocaine (EMLA) cream Apply 1 application topically as needed. (Patient not taking: Reported on 02/26/2015) 30 g 0  . prochlorperazine (COMPAZINE) 10 MG tablet Take 1 tablet (10 mg total) by mouth every 6 (six) hours as needed for nausea or vomiting. (Patient not taking: Reported on 02/26/2015) 30 tablet 0   Current Facility-Administered Medications on File Prior to Encounter  Medication Dose Route Frequency Provider Last Rate Last Dose  . fludeoxyglucose F - 18 (FDG) injection 8.3 milli Curie  8.3 milli Curie Intravenous Once PRN Medication Radiologist, MD   8.3 milli Curie at 02/23/15 0905    Physical  Findings. . . Weight essentially stable.  No significant changes. Impression . . . . . . . The patient tolerated radiation relatively well.    If the patient's left hip pain becomes worse, then it might warrant palliative radiation. Plan . . . . . . . . . . . . The patient is scheduled for SRS CT simulation later today. His SRS treatment is scheduled on 03/05/15 with 20 Gy in 1 fraction. He is scheduled to begin chemotherapy (Abraxane/Carboplatin) on 03/09/15.  This document serves as a record of services personally performed by Tyler Pita, MD. It was created on his behalf by Darcus Austin, a trained medical scribe. The creation of this record is based on the scribe's personal observations and the provider's statements to them. This document has been checked and approved by the attending provider.     ________________________________  Sheral Apley. Tammi Klippel, M.D.

## 2015-02-26 NOTE — Progress Notes (Signed)
  Radiation Oncology         (336) [phone] ________________________________  Name: Bill Valdez MRN: [id number]  Date: 02/26/2015  DOB: 05-Jul-1960  SIMULATION AND TREATMENT PLANNING NOTE    ICD-9-CM ICD-10-CM   1. Brain metastasis 198.3 C79.31   2. 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  Identity was confirmed.  All relevant records and images related to the planned course of therapy were reviewed.  The patient freely provided informed written consent to proceed with treatment after reviewing the details related to the planned course of therapy. The consent form was witnessed and verified by the simulation staff. Intravenous access was established for contrast administration. Then, the patient was set-up in a stable reproducible supine position for radiation therapy.  A relocatable thermoplastic stereotactic head frame was fabricated for precise immobilization.  CT images were obtained.  Surface markings were placed.  The CT images were loaded into the planning software and fused with the patient's targeting MRI scan.  Then the target and avoidance structures were contoured.  Treatment planning then occurred.  The radiation prescription was entered and confirmed.  I have requested 3D planning  I have requested a DVH of the following structures: Brain stem, brain, left eye, right eye, lenses, optic chiasm, target volumes, uninvolved brain, and normal tissue.    SPECIAL TREATMENT PROCEDURE:  The planned course of therapy using radiation constitutes a special treatment procedure. Special care is required in the management of this patient for the following reasons. This treatment constitutes a Special Treatment Procedure for the following reason: High dose per fraction requiring special monitoring for increased  toxicities of treatment including daily imaging. The special nature of the planned course of radiotherapy will require increased physician supervision and oversight to ensure patient's safety with optimal treatment outcomes.  PLAN:  The patient will receive 20 Gy in 1 fraction.  This document serves as a record of services personally performed by Tyler Pita, MD. It was created on his behalf by Darcus Austin, a trained medical scribe. The creation of this record is based on the scribe's personal observations and the provider's statements to them. This document has been checked and approved by the attending provider.     ________________________________  Sheral Apley. Tammi Klippel, M.D.

## 2015-02-26 NOTE — Progress Notes (Signed)
Patient denies taking Metformin. Patient denies an allergy to contrast dye. Patient denies having a pacemaker. 9/22 BUN 13.8 and creatinine 0.7. Started right AC 22 gauge IV on the first attempt. Excellent blood return. Site flushed without difficulty. Secured IV in place. Patient escorted to sim by Mont Dutton, RT.

## 2015-02-26 NOTE — Telephone Encounter (Signed)
Left a message for pt to ask Dentist to fax clearance letter and gave him our fax number.

## 2015-02-26 NOTE — Telephone Encounter (Signed)
Pt asking if he should finish hep b series of vaccines. His first one was aug 23 , next one is due next week and last one in dec. Note to [facility]

## 2015-02-27 ENCOUNTER — Encounter: Payer: Self-pay | Admitting: Internal Medicine

## 2015-03-01 ENCOUNTER — Ambulatory Visit: Payer: BLUE CROSS/BLUE SHIELD

## 2015-03-01 ENCOUNTER — Other Ambulatory Visit: Payer: Self-pay | Admitting: Radiology

## 2015-03-01 DIAGNOSIS — Z510 Encounter for antineoplastic radiation therapy: Secondary | ICD-10-CM | POA: Diagnosis not present

## 2015-03-02 ENCOUNTER — Other Ambulatory Visit: Payer: Self-pay | Admitting: Radiology

## 2015-03-02 ENCOUNTER — Telehealth: Payer: Self-pay | Admitting: *Deleted

## 2015-03-02 ENCOUNTER — Ambulatory Visit: Payer: BLUE CROSS/BLUE SHIELD

## 2015-03-02 ENCOUNTER — Encounter (HOSPITAL_COMMUNITY): Payer: Self-pay

## 2015-03-02 NOTE — Telephone Encounter (Signed)
-----   Message from Ardeen Garland, RN sent at 03/02/2015  8:16 AM EDT ----- Regarding: FW: [phone] Can you call him  ----- Message -----    From: Curt Bears, MD    Sent: 02/27/2015  12:01 PM      To: Ardeen Garland, RN Subject: RE: [id number]                                 Yes he can finish it if he wants. ----- Message -----    From: Ardeen Garland, RN    Sent: 02/26/2015   1:24 PM      To: Lucile Crater, RN, Curt Bears, MD Subject: [id number]                                     Pt asking if he should finish hep b series of vaccines. His first one was aug 23 , next one is due next week and last one in dec.

## 2015-03-02 NOTE — Telephone Encounter (Signed)
Called pt discussed per MD ok to finish hep B vaccines if he wants. Pt verbalized understanding no further concerns.

## 2015-03-03 ENCOUNTER — Ambulatory Visit: Payer: BLUE CROSS/BLUE SHIELD

## 2015-03-03 ENCOUNTER — Other Ambulatory Visit: Payer: Self-pay | Admitting: Internal Medicine

## 2015-03-03 ENCOUNTER — Ambulatory Visit (HOSPITAL_COMMUNITY)
Admission: RE | Admit: 2015-03-03 | Discharge: 2015-03-03 | Disposition: A | Discharge status: Home / Self Care | Payer: BLUE CROSS/BLUE SHIELD | Source: Ambulatory Visit | Attending: Internal Medicine | Admitting: Internal Medicine

## 2015-03-03 ENCOUNTER — Encounter (HOSPITAL_COMMUNITY): Payer: Self-pay

## 2015-03-03 DIAGNOSIS — C787 Secondary malignant neoplasm of liver and intrahepatic bile duct: Secondary | ICD-10-CM | POA: Diagnosis not present

## 2015-03-03 DIAGNOSIS — C3490 Malignant neoplasm of unspecified part of unspecified bronchus or lung: Secondary | ICD-10-CM | POA: Insufficient documentation

## 2015-03-03 DIAGNOSIS — C7951 Secondary malignant neoplasm of bone: Secondary | ICD-10-CM | POA: Insufficient documentation

## 2015-03-03 DIAGNOSIS — C3411 Malignant neoplasm of upper lobe, right bronchus or lung: Secondary | ICD-10-CM

## 2015-03-03 DIAGNOSIS — Z880 Allergy status to penicillin: Secondary | ICD-10-CM | POA: Diagnosis not present

## 2015-03-03 HISTORY — DX: Reserved for concepts with insufficient information to code with codable children: IMO0002

## 2015-03-03 HISTORY — DX: Reserved for inherently not codable concepts without codable children: IMO0001

## 2015-03-03 HISTORY — DX: Malignant (primary) neoplasm, unspecified: C80.1

## 2015-03-03 LAB — CBC WITH DIFFERENTIAL/PLATELET
Basophils Absolute: 0 10*3/uL (ref 0.0–0.1)
Basophils Relative: 0 %
EOS ABS: 0.1 10*3/uL (ref 0.0–0.7)
Eosinophils Relative: 2 %
HCT: 37.3 % — ABNORMAL LOW (ref 39.0–52.0)
HEMOGLOBIN: 12.4 g/dL — AB (ref 13.0–17.0)
LYMPHS ABS: 0.7 10*3/uL (ref 0.7–4.0)
LYMPHS PCT: 8 %
MCH: 30.6 pg (ref 26.0–34.0)
MCHC: 33.2 g/dL (ref 30.0–36.0)
MCV: 92.1 fL (ref 78.0–100.0)
Monocytes Absolute: 0.9 10*3/uL (ref 0.1–1.0)
Monocytes Relative: 11 %
NEUTROS PCT: 79 %
Neutro Abs: 6.1 10*3/uL (ref 1.7–7.7)
Platelets: 342 10*3/uL (ref 150–400)
RBC: 4.05 MIL/uL — AB (ref 4.22–5.81)
RDW: 12.5 % (ref 11.5–15.5)
WBC: 7.7 10*3/uL (ref 4.0–10.5)

## 2015-03-03 LAB — PROTIME-INR
INR: 1.19 (ref 0.00–1.49)
PROTHROMBIN TIME: 15.3 s — AB (ref 11.6–15.2)

## 2015-03-03 MED ORDER — IOHEXOL 300 MG/ML  SOLN
10.0000 mL | Freq: Once | INTRAMUSCULAR | Status: DC | PRN
  Administered 2015-03-03: 10 mL via INTRAVENOUS
  Filled 2015-03-03: qty 10

## 2015-03-03 MED ORDER — FENTANYL CITRATE (PF) 100 MCG/2ML IJ SOLN
INTRAMUSCULAR | Status: AC | PRN
  Administered 2015-03-03: 50 ug via INTRAVENOUS
  Administered 2015-03-03: 25 ug via INTRAVENOUS

## 2015-03-03 MED ORDER — HEPARIN SOD (PORK) LOCK FLUSH 100 UNIT/ML IV SOLN
INTRAVENOUS | Status: AC
  Filled 2015-03-03: qty 5

## 2015-03-03 MED ORDER — SODIUM CHLORIDE 0.9 % IV SOLN
INTRAVENOUS | Status: DC
  Administered 2015-03-03: 13:00:00 via INTRAVENOUS

## 2015-03-03 MED ORDER — MIDAZOLAM HCL 2 MG/2ML IJ SOLN
INTRAMUSCULAR | Status: AC
  Filled 2015-03-03: qty 4

## 2015-03-03 MED ORDER — HEPARIN SOD (PORK) LOCK FLUSH 100 UNIT/ML IV SOLN
INTRAVENOUS | Status: AC | PRN
  Administered 2015-03-03: 500 [IU]

## 2015-03-03 MED ORDER — LIDOCAINE HCL 1 % IJ SOLN
INTRAMUSCULAR | Status: AC
  Filled 2015-03-03: qty 20

## 2015-03-03 MED ORDER — FENTANYL CITRATE (PF) 100 MCG/2ML IJ SOLN
INTRAMUSCULAR | Status: AC
  Filled 2015-03-03: qty 2

## 2015-03-03 MED ORDER — MIDAZOLAM HCL 2 MG/2ML IJ SOLN
INTRAMUSCULAR | Status: AC | PRN
  Administered 2015-03-03: 0.5 mg via INTRAVENOUS
  Administered 2015-03-03: 1 mg via INTRAVENOUS

## 2015-03-03 MED ORDER — LIDOCAINE-EPINEPHRINE 2 %-1:100000 IJ SOLN
INTRAMUSCULAR | Status: AC
  Filled 2015-03-03: qty 1

## 2015-03-03 MED ORDER — VANCOMYCIN HCL IN DEXTROSE 1-5 GM/200ML-% IV SOLN
1000.0000 mg | INTRAVENOUS | Status: AC
  Administered 2015-03-03: 1000 mg via INTRAVENOUS
  Filled 2015-03-03: qty 200

## 2015-03-03 NOTE — Procedures (Signed)
Successful LT IJ POWER PORT TIP SVC/RA NO COMP STABLE READY FOR USE FULL REPORT IN PACS

## 2015-03-03 NOTE — Discharge Instructions (Signed)
Implanted Port Insertion, Care After °Refer to this sheet in the next few weeks. These instructions provide you with information on caring for yourself after your procedure. Your health care provider may also give you more specific instructions. Your treatment has been planned according to current medical practices, but problems sometimes occur. Call your health care provider if you have any problems or questions after your procedure. °WHAT TO EXPECT AFTER THE PROCEDURE °After your procedure, it is typical to have the following:  °· Discomfort at the port insertion site. Ice packs to the area will help. °· Bruising on the skin over the port. This will subside in 3-4 days. °HOME CARE INSTRUCTIONS °· After your port is placed, you will get a manufacturer's information card. The card has information about your port. Keep this card with you at all times.   °· Know what kind of port you have. There are many types of ports available.   °· Wear a medical alert bracelet in case of an emergency. This can help alert health care workers that you have a port.   °· The port can stay in for as long as your health care provider believes it is necessary.   °· A home health care nurse may give medicines and take care of the port.   °· You or a family member can get special training and directions for giving medicine and taking care of the port at home.   °SEEK MEDICAL CARE IF:  °· Your port does not flush or you are unable to get a blood return.   °· You have a fever or chills. °SEEK IMMEDIATE MEDICAL CARE IF: °· You have new fluid or pus coming from your incision.   °· You notice a bad smell coming from your incision site.   °· You have swelling, pain, or more redness at the incision or port site.   °· You have chest pain or shortness of breath. °Document Released: 03/12/2013 Document Revised: 05/27/2013 Document Reviewed: 03/12/2013 °ExitCare® Patient Information ©2015 [employer]. This information is not intended to replace  advice given to you by your health care provider. Make sure you discuss any questions you have with your health care provider. °Implanted Port Home Guide °An implanted port is a type of central line that is placed under the skin. Central lines are used to provide IV access when treatment or nutrition needs to be given through a person's veins. Implanted ports are used for long-term IV access. An implanted port may be placed because:  °· You need IV medicine that would be irritating to the small veins in your hands or arms.   °· You need long-term IV medicines, such as antibiotics.   °· You need IV nutrition for a long period.   °· You need frequent blood draws for lab tests.   °· You need dialysis.   °Implanted ports are usually placed in the chest area, but they can also be placed in the upper arm, the abdomen, or the leg. An implanted port has two main parts:  °· Reservoir. The reservoir is round and will appear as a small, raised area under your skin. The reservoir is the part where a needle is inserted to give medicines or draw blood.   °· Catheter. The catheter is a thin, flexible tube that extends from the reservoir. The catheter is placed into a large vein. Medicine that is inserted into the reservoir goes into the catheter and then into the vein.   °HOW WILL I CARE FOR MY INCISION SITE? °Do not get the incision site wet. Bathe or   shower as directed by your health care provider.  °HOW IS MY PORT ACCESSED? °Special steps must be taken to access the port:  °· Before the port is accessed, a numbing cream can be placed on the skin. This helps numb the skin over the port site.   °· Your health care provider uses a sterile technique to access the port. °· Your health care provider must put on a mask and sterile gloves. °· The skin over your port is cleaned carefully with an antiseptic and allowed to dry. °· The port is gently pinched between sterile gloves, and a needle is inserted into the port. °· Only  "non-coring" port needles should be used to access the port. Once the port is accessed, a blood return should be checked. This helps ensure that the port is in the vein and is not clogged.   °· If your port needs to remain accessed for a constant infusion, a clear (transparent) bandage will be placed over the needle site. The bandage and needle will need to be changed every week, or as directed by your health care provider.   °· Keep the bandage covering the needle clean and dry. Do not get it wet. Follow your health care provider's instructions on how to take a shower or bath while the port is accessed.   °· If your port does not need to stay accessed, no bandage is needed over the port.   °WHAT IS FLUSHING? °Flushing helps keep the port from getting clogged. Follow your health care provider's instructions on how and when to flush the port. Ports are usually flushed with saline solution or a medicine called heparin. The need for flushing will depend on how the port is used.  °· If the port is used for intermittent medicines or blood draws, the port will need to be flushed:   °· After medicines have been given.   °· After blood has been drawn.   °· As part of routine maintenance.   °· If a constant infusion is running, the port may not need to be flushed.   °HOW LONG WILL MY PORT STAY IMPLANTED? °The port can stay in for as long as your health care provider thinks it is needed. When it is time for the port to come out, surgery will be done to remove it. The procedure is similar to the one performed when the port was put in.  °WHEN SHOULD I SEEK IMMEDIATE MEDICAL CARE? °When you have an implanted port, you should seek immediate medical care if:  °· You notice a bad smell coming from the incision site.   °· You have swelling, redness, or drainage at the incision site.   °· You have more swelling or pain at the port site or the surrounding area.   °· You have a fever that is not controlled with medicine. °Document  Released: 05/22/2005 Document Revised: 03/12/2013 Document Reviewed: 01/27/2013 °ExitCare® Patient Information ©2015 [employer]. This information is not intended to replace advice given to you by your health care provider. Make sure you discuss any questions you have with your health care provider. °Conscious Sedation °Sedation is the use of medicines to promote relaxation and relieve discomfort and anxiety. Conscious sedation is a type of sedation. Under conscious sedation you are less alert than normal but are still able to respond to instructions or stimulation. Conscious sedation is used during short medical and dental procedures. It is milder than deep sedation or general anesthesia and allows you to return to your regular activities sooner.  °LET YOUR HEALTH CARE PROVIDER   KNOW ABOUT:  °· Any allergies you have. °· All medicines you are taking, including vitamins, herbs, eye drops, creams, and over-the-counter medicines. °· Use of steroids (by mouth or creams). °· Previous problems you or members of your family have had with the use of anesthetics. °· Any blood disorders you have. °· Previous surgeries you have had. °· Medical conditions you have. °· Possibility of pregnancy, if this applies. °· Use of cigarettes, alcohol, or illegal drugs. °RISKS AND COMPLICATIONS °Generally, this is a safe procedure. However, as with any procedure, problems can occur. Possible problems include: °· Oversedation. °· Trouble breathing on your own. You may need to have a breathing tube until you are awake and breathing on your own. °· Allergic reaction to any of the medicines used for the procedure. °BEFORE THE PROCEDURE °· You may have blood tests done. These tests can help show how well your kidneys and liver are working. They can also show how well your blood clots. °· A physical exam will be done.   °· Only take medicines as directed by your health care provider. You may need to stop taking medicines (such as blood  thinners, aspirin, or nonsteroidal anti-inflammatory drugs) before the procedure.   °· Do not eat or drink at least 6 hours before the procedure or as directed by your health care provider. °· Arrange for a responsible adult, family member, or friend to take you home after the procedure. He or she should stay with you for at least 24 hours after the procedure, until the medicine has worn off. °PROCEDURE  °· An intravenous (IV) catheter will be inserted into one of your veins. Medicine will be able to flow directly into your body through this catheter. You may be given medicine through this tube to help prevent pain and help you relax. °· The medical or dental procedure will be done. °AFTER THE PROCEDURE °· You will stay in a recovery area until the medicine has worn off. Your blood pressure and pulse will be checked.   °·  Depending on the procedure you had, you may be allowed to go home when you can tolerate liquids and your pain is under control. °Document Released: 02/14/2001 Document Revised: 05/27/2013 Document Reviewed: 01/27/2013 °ExitCare® Patient Information ©2015 [employer]. This information is not intended to replace advice given to you by your health care provider. Make sure you discuss any questions you have with your health care provider. °Conscious Sedation, Adult, Care After °Refer to this sheet in the next few weeks. These instructions provide you with information on caring for yourself after your procedure. Your health care provider may also give you more specific instructions. Your treatment has been planned according to current medical practices, but problems sometimes occur. Call your health care provider if you have any problems or questions after your procedure. °WHAT TO EXPECT AFTER THE PROCEDURE  °After your procedure: °· You may feel sleepy, clumsy, and have poor balance for several hours. °· Vomiting may occur if you eat too soon after the procedure. °HOME CARE INSTRUCTIONS °· Do not  participate in any activities where you could become injured for at least 24 hours. Do not: °¨ Drive. °¨ Swim. °¨ Ride a bicycle. °¨ Operate heavy machinery. °¨ Cook. °¨ Use power tools. °¨ Climb ladders. °¨ Work from a high place. °· Do not make important decisions or sign legal documents until you are improved. °· If you vomit, drink water, juice, or soup when you can drink without vomiting. Make sure you have little   or no nausea before eating solid foods.  Only take over-the-counter or prescription medicines for pain, discomfort, or fever as directed by your health care provider.  Make sure you and your family fully understand everything about the medicines given to you, including what side effects may occur.  You should not drink alcohol, take sleeping pills, or take medicines that cause drowsiness for at least 24 hours.  If you smoke, do not smoke without supervision.  If you are feeling better, you may resume normal activities 24 hours after you were sedated.  Keep all appointments with your health care provider. SEEK MEDICAL CARE IF:  Your skin is pale or bluish in color.  You continue to feel nauseous or vomit.  Your pain is getting worse and is not helped by medicine.  You have bleeding or swelling.  You are still sleepy or feeling clumsy after 24 hours. SEEK IMMEDIATE MEDICAL CARE IF:  You develop a rash.  You have difficulty breathing.  You develop any type of allergic problem.  You have a fever. MAKE SURE YOU:  Understand these instructions.  Will watch your condition.  Will get help right away if you are not doing well or get worse. Document Released: 03/12/2013 Document Reviewed: 03/12/2013 [facility] Patient Information 2015 [place], [place]. This information is not intended to replace advice given to you by your health care provider. Make sure you discuss any questions you have with your health care provider.  May remove dressing and shower in 24 to 48  hours.

## 2015-03-03 NOTE — Progress Notes (Signed)
Patient ID: Bill Valdez, male   DOB: [birth year], 54 y.o.   MRN: [id number]    Referring Physician(s): Mohamed,Mohamed  Chief Complaint:  Lung cancer  Subjective: Pt familiar to IR service from prior US guided core biopsy of liver mass on 02/22/2015. 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 He is currently undergoing palliative radiotherapy to the right lung mass . He presents today for port a cath placement for chemotherapy. He denies recent fevers, chills, HA, CP, abd pain,N/V or abnormal bleeding. He does have occ back pain, dyspnea with exertion and nonproductive cough.    Allergies: Penicillins  Medications: Prior to Admission medications   Medication Sig Start Date End Date Taking? Authorizing Provider  acetaminophen (TYLENOL) 500 MG tablet Take 1,000 mg by mouth every 6 (six) hours as needed for moderate pain.   Yes Historical Provider, MD  nicotine polacrilex (NICORETTE) 4 MG gum Take 4 mg by mouth as needed for smoking cessation.   Yes Historical Provider, MD  lidocaine-prilocaine (EMLA) cream Apply 1 application topically as needed. Patient not taking: Reported on 02/26/2015 02/25/15   Curt Bears, MD  prochlorperazine (COMPAZINE) 10 MG tablet Take 1 tablet (10 mg total) by mouth every 6 (six) hours as needed for nausea or vomiting. Patient not taking: Reported on 02/26/2015 02/25/15   Curt Bears, MD  Wound Cleansers (RADIAPLEX EX) Apply topically.    Historical Provider, MD     Vital Signs: There were no vitals taken for this visit.  Physical Exam  Constitutional: He is oriented to person, place, and time. He appears well-developed and well-nourished.  Cardiovascular: Normal rate and regular rhythm.   Pulmonary/Chest:  Effort normal.  Distant BS bilat with few rt basilar crackles  Abdominal: Soft. Bowel sounds are normal. There is no tenderness.  Musculoskeletal: Normal range of motion. He exhibits no edema.  Neurological: He is alert and oriented to person, place, and time.    Imaging: No results found.  Labs:  CBC:  Recent Labs  02/22/15 1330 02/25/15 1611 02/26/15 1548 03/03/15 1257  WBC 9.7 8.4 7.9 7.7  HGB 12.7* 12.4* 12.2* 12.4*  HCT 38.3* 36.5* 36.6* 37.3*  PLT 271 292 289 342    COAGS:  Recent Labs  02/22/15 1330 03/03/15 1257  INR 1.22 1.19  APTT 33  --     BMP:  Recent Labs  02/25/15 1612 02/26/15 1548  NA 135* 134*  K 4.4 4.2  CO2 24 26  GLUCOSE 92 87  BUN 13.8 10.1  CALCIUM 9.3 9.3  CREATININE 0.7 0.8    LIVER FUNCTION TESTS:  Recent Labs  02/25/15 1612 02/26/15 1548  BILITOT 0.41 0.61  AST 23 21  ALT <9 <9  ALKPHOS 87 82  PROT 7.5 7.1  ALBUMIN 3.3* 3.2*    Assessment and Plan: Pt with newly diagnosed stage IV squamous cell carcinoma of lung, currently undergoing radiotherapy to rt lung . Tent plan is for port a cath placement today for chemotherapy. Risks and benefits discussed with the patient including, but not limited to bleeding, infection, pneumothorax, or fibrin sheath development and need for additional procedures. All of the patient's questions were answered, patient is agreeable to proceed. Consent signed and in chart.     Signed: D. Rowe Robert 03/03/2015, 2:03 PM  I spent a total of 15 minutes  at the the patient's bedside AND on the patient's hospital floor or unit, greater than 50% of which was counseling/coordinating care for port a cath placement

## 2015-03-04 ENCOUNTER — Other Ambulatory Visit: Payer: Self-pay | Admitting: Medical Oncology

## 2015-03-04 ENCOUNTER — Ambulatory Visit: Payer: BLUE CROSS/BLUE SHIELD

## 2015-03-04 DIAGNOSIS — Z510 Encounter for antineoplastic radiation therapy: Secondary | ICD-10-CM | POA: Diagnosis not present

## 2015-03-05 ENCOUNTER — Ambulatory Visit: Payer: BLUE CROSS/BLUE SHIELD

## 2015-03-05 ENCOUNTER — Telehealth: Payer: Self-pay | Admitting: *Deleted

## 2015-03-05 ENCOUNTER — Ambulatory Visit
Admission: RE | Admit: 2015-03-05 | Discharge: 2015-03-05 | Disposition: A | Discharge status: Home / Self Care | Payer: BLUE CROSS/BLUE SHIELD | Source: Ambulatory Visit | Attending: Radiation Oncology | Admitting: Radiation Oncology

## 2015-03-05 VITALS — BP 105/59 | HR 87 | Temp 97.7°F | Resp 16

## 2015-03-05 DIAGNOSIS — Z510 Encounter for antineoplastic radiation therapy: Secondary | ICD-10-CM | POA: Diagnosis not present

## 2015-03-05 DIAGNOSIS — C7951 Secondary malignant neoplasm of bone: Secondary | ICD-10-CM

## 2015-03-05 DIAGNOSIS — C7931 Secondary malignant neoplasm of brain: Secondary | ICD-10-CM

## 2015-03-05 NOTE — Telephone Encounter (Signed)
Oncology Nurse Navigator Documentation  Oncology Nurse Navigator Flowsheets 03/05/2015  Navigator Encounter Type Telephone/called to follow up with patient to see how he was doing.  I left vm message to call if needed.   Patient Visit Type Follow-up  Treatment Phase Treatment  Barriers/Navigation Needs -  Education -  Interventions Other  Coordination of Care -  Time Spent with Patient 15

## 2015-03-05 NOTE — Progress Notes (Signed)
  Radiation Oncology         (336) [phone] ________________________________  Stereotactic Treatment Procedure Note  Name: Bill Valdez MRN: [id number]  Date: 03/05/2015  DOB: [birth year]  SPECIAL TREATMENT PROCEDURE    ICD-9-CM ICD-10-CM   1. Brain metastasis ([place]) 198.3 C79.31    3D TREATMENT PLANNING AND DOSIMETRY:  The patient's radiation plan was reviewed and approved by neurosurgery and radiation oncology prior to treatment.  It showed 3-dimensional radiation distributions overlaid onto the planning CT/MRI image set.  The [facility] for the target structures as well as the organs at risk were reviewed. The documentation of the 3D plan and dosimetry are filed in the radiation oncology EMR.  NARRATIVE:  Bill Valdez was brought to the TrueBeam stereotactic radiation treatment machine and placed supine on the CT couch. The head frame was applied, and the patient was set up for stereotactic radiosurgery.  Neurosurgery was present for the set-up and delivery  SIMULATION VERIFICATION:  In the couch zero-angle position, the patient underwent Exactrac imaging using the Brainlab system with orthogonal KV images.  These were carefully aligned and repeated to confirm treatment position for each of the isocenters.  The Exactrac snap film verification was repeated at each couch angle.  PROCEDURE: Bill Valdez received stereotactic radiosurgery to the following targets: Right Frontal 74m target was treated using 2 Circular Arcs to a prescription dose of 20 Gy.  ExacTrac registration was performed for each couch angle.  The 82% isodose line was prescribed. The patient was treated with the 7.570mcollimators.  6 MV X-rays were delivered in the flattening filter free beam mode.   STEREOTACTIC TREATMENT MANAGEMENT:  Following delivery, the patient was transported to nursing in stable condition and monitored for possible acute effects.  Vital signs were recorded BP 105/59 mmHg  Pulse 87  Temp(Src) 97.7 F  (36.5 C) (Oral)  Resp 16  SpO2 100% The patient tolerated treatment without significant acute effects, and was discharged to home in stable condition.    PLAN: Follow-up in one month.  This document serves as a record of services personally performed by MaTyler PitaMD. It was created on his behalf by StLenn Cala trained medical scribe. The creation of this record is based on the scribe's personal observations and the provider's statements to them. This document has been checked and approved by the attending provider.  ________________________________  MaSheral ApleyMaTammi KlippelM.D.

## 2015-03-05 NOTE — Progress Notes (Signed)
Nurse monitoring following SRS treatment complete. Vitals stable. Denies pain. Patient denies taking Decadron. Denies headache, dizziness, nausea, diplopia or ringing in the ears. Patient understands to avoid strenuous activity for the next 24 hours and call [phone] with needs. Patient discharged home and left ambulatory. Steady gait noted.   BP 105/59 mmHg  Pulse 87  Temp(Src) 97.7 F (36.5 C) (Oral)  Resp 16  SpO2 100% Wt Readings from Last 3 Encounters:  03/03/15 162 lb (73.483 kg)  02/26/15 165 lb 4.8 oz (74.98 kg)  02/25/15 165 lb 4.8 oz (74.98 kg)

## 2015-03-05 NOTE — Progress Notes (Signed)
  Radiation Oncology         [phone] ________________________________  Name: Bill Valdez MRN: [id number]  Date: 03/05/2015  DOB: Sep 09, 1960  SIMULATION AND TREATMENT PLANNING NOTE    ICD-9-CM ICD-10-CM   1. 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  Identity was confirmed.  All relevant records and images related to the planned course of therapy were reviewed.  The patient freely provided informed written consent to proceed with treatment after reviewing the details related to the planned course of therapy. The consent form was witnessed and verified by the simulation staff.  Then, the patient was set-up in a stable reproducible  supine position for radiation therapy.  CT images were obtained.  Surface markings were placed.  The CT images were loaded into the planning software.  Then the target and avoidance structures were contoured.  Treatment planning then occurred.  The radiation prescription was entered and confirmed.  Then, I designed and supervised the construction of a total of 3 medically necessary complex treatment devices including BodyFix immobilization and 2 MLC apertures to shield genitalia bladder and bowel.  I have requested : Isodose Plan.   PLAN:  The patient will receive 20 Gy in 5 fractions.  This document serves as a record of services personally performed by Tyler Pita, MD. It was created on his behalf by Lenn Cal, a trained medical scribe. The creation of this record is based on the scribe's personal observations and the provider's statements to them. This document has been checked and approved by the attending provider.  ________________________________  Sheral Apley. Tammi Klippel, M.D.

## 2015-03-05 NOTE — Op Note (Signed)
Stereotactic Radiosurgery Operative Note  Name: Bill Valdez MRN: [id number]  Date: 03/05/2015  DOB: [birth year]  Op Note  Pre Operative Diagnosis:  Metastatic lung cancer with brain metastasis  Post Operative Diagnois:  Metastatic lung cancer with brain metastasis  3D TREATMENT PLANNING AND DOSIMETRY:  The patient's radiation plan was reviewed and approved by myself (neurosurgery) and Dr. Ledon Snare (radiation oncology) prior to treatment.  It showed 3-dimensional radiation distributions overlaid onto the planning CT/MRI image set.  [facility] for the target structures as well as the organs at risk were reviewed. The documentation of the 3D plan and dosimetry are filed in the radiation oncology EMR.  NARRATIVE:  Bill Valdez was brought to the TrueBeam stereotactic radiation treatment machine and placed supine on the CT couch. The head frame was applied, and the patient was set up for stereotactic radiosurgery.  I was present for the set-up and delivery.  SIMULATION VERIFICATION:  In the couch zero-angle position, the patient underwent Exactrac imaging using the Brainlab system with orthogonal KV images.  These were carefully aligned and repeated to confirm treatment position for each of the isocenters.  The Exactrac snap film verification was repeated at each couch angle.  SPECIAL TREATMENT PROCEDURE: Bill Valdez received stereotactic radiosurgery to the following targets: Right frontal target was treated using 2 Circular Arcs to a prescription dose of 20 Gy.  ExacTrac registration was performed for each couch angle.  The 82% isodose line was prescribed.  STEREOTACTIC TREATMENT MANAGEMENT:  Following delivery, the patient was transported to nursing in stable condition and monitored for possible acute effects.  Vital signs were recorded There were no vitals taken for this visit.. The patient tolerated treatment without significant acute effects, and was discharged to home in stable  condition.    PLAN: Follow-up in one month.

## 2015-03-08 ENCOUNTER — Ambulatory Visit: Payer: BLUE CROSS/BLUE SHIELD

## 2015-03-09 ENCOUNTER — Ambulatory Visit: Payer: BLUE CROSS/BLUE SHIELD

## 2015-03-09 ENCOUNTER — Other Ambulatory Visit: Payer: BLUE CROSS/BLUE SHIELD

## 2015-03-09 ENCOUNTER — Other Ambulatory Visit (HOSPITAL_BASED_OUTPATIENT_CLINIC_OR_DEPARTMENT_OTHER): Payer: BLUE CROSS/BLUE SHIELD

## 2015-03-09 ENCOUNTER — Encounter: Payer: Self-pay | Admitting: *Deleted

## 2015-03-09 DIAGNOSIS — C7951 Secondary malignant neoplasm of bone: Secondary | ICD-10-CM

## 2015-03-09 DIAGNOSIS — C3411 Malignant neoplasm of upper lobe, right bronchus or lung: Secondary | ICD-10-CM

## 2015-03-09 DIAGNOSIS — Z510 Encounter for antineoplastic radiation therapy: Secondary | ICD-10-CM | POA: Diagnosis not present

## 2015-03-09 DIAGNOSIS — C787 Secondary malignant neoplasm of liver and intrahepatic bile duct: Secondary | ICD-10-CM | POA: Diagnosis not present

## 2015-03-09 LAB — COMPREHENSIVE METABOLIC PANEL (CC13)
ALBUMIN: 3.5 g/dL (ref 3.5–5.0)
ALK PHOS: 93 U/L (ref 40–150)
ALT: 13 U/L (ref 0–55)
AST: 26 U/L (ref 5–34)
Anion Gap: 8 mEq/L (ref 3–11)
BILIRUBIN TOTAL: 0.55 mg/dL (ref 0.20–1.20)
BUN: 13.2 mg/dL (ref 7.0–26.0)
CALCIUM: 10 mg/dL (ref 8.4–10.4)
CO2: 27 mEq/L (ref 22–29)
CREATININE: 0.8 mg/dL (ref 0.7–1.3)
Chloride: 103 mEq/L (ref 98–109)
EGFR: >90 mL/min/{1.73_m2} (ref >90)
GLUCOSE: 94 mg/dL (ref 70–140)
Potassium: 4.8 mEq/L (ref 3.5–5.1)
SODIUM: 137 meq/L (ref 136–145)
TOTAL PROTEIN: 7.9 g/dL (ref 6.4–8.3)

## 2015-03-09 LAB — CBC WITH DIFFERENTIAL/PLATELET
BASO%: 0.5 % (ref 0.0–2.0)
Basophils Absolute: 0 10*3/uL (ref 0.0–0.1)
EOS ABS: 0.1 10*3/uL (ref 0.0–0.5)
EOS%: 1.8 % (ref 0.0–7.0)
HEMATOCRIT: 40 % (ref 38.4–49.9)
HEMOGLOBIN: 13.4 g/dL (ref 13.0–17.1)
LYMPH#: 0.6 10*3/uL — AB (ref 0.9–3.3)
LYMPH%: 7.2 % — ABNORMAL LOW (ref 14.0–49.0)
MCH: 30.6 pg (ref 27.2–33.4)
MCHC: 33.5 g/dL (ref 32.0–36.0)
MCV: 91.2 fL (ref 79.3–98.0)
MONO#: 1 10*3/uL — ABNORMAL HIGH (ref 0.1–0.9)
MONO%: 11.3 % (ref 0.0–14.0)
NEUT%: 79.2 % — ABNORMAL HIGH (ref 39.0–75.0)
NEUTROS ABS: 6.8 10*3/uL — AB (ref 1.5–6.5)
Platelets: 314 10*3/uL (ref 140–400)
RBC: 4.39 10*6/uL (ref 4.20–5.82)
RDW: 12.9 % (ref 11.0–14.6)
WBC: 8.5 10*3/uL (ref 4.0–10.3)

## 2015-03-09 NOTE — Progress Notes (Signed)
Patient was here to start chemo class when he received phone call from [facility], Dr. Durenda Hurt.  Patient is not going to do chemotherapy, he is going to [facility] for immunotherapy.  Dr. Julien Nordmann made aware of.  Infusion room aware that chemo treatment cancelled today.

## 2015-03-10 ENCOUNTER — Ambulatory Visit: Payer: BLUE CROSS/BLUE SHIELD

## 2015-03-10 DIAGNOSIS — Z510 Encounter for antineoplastic radiation therapy: Secondary | ICD-10-CM | POA: Diagnosis not present

## 2015-03-11 ENCOUNTER — Ambulatory Visit: Payer: BLUE CROSS/BLUE SHIELD

## 2015-03-11 ENCOUNTER — Ambulatory Visit
Admission: RE | Admit: 2015-03-11 | Discharge: 2015-03-11 | Disposition: A | Discharge status: Home / Self Care | Payer: BLUE CROSS/BLUE SHIELD | Source: Ambulatory Visit | Attending: Radiation Oncology | Admitting: Radiation Oncology

## 2015-03-11 DIAGNOSIS — Z510 Encounter for antineoplastic radiation therapy: Secondary | ICD-10-CM | POA: Diagnosis not present

## 2015-03-12 ENCOUNTER — Ambulatory Visit
Admission: RE | Admit: 2015-03-12 | Discharge: 2015-03-12 | Disposition: A | Discharge status: Home / Self Care | Payer: BLUE CROSS/BLUE SHIELD | Source: Ambulatory Visit | Attending: Radiation Oncology | Admitting: Radiation Oncology

## 2015-03-12 ENCOUNTER — Other Ambulatory Visit: Payer: BLUE CROSS/BLUE SHIELD

## 2015-03-12 ENCOUNTER — Encounter: Payer: Self-pay | Admitting: Radiation Oncology

## 2015-03-12 ENCOUNTER — Telehealth: Payer: Self-pay | Admitting: Medical Oncology

## 2015-03-12 ENCOUNTER — Ambulatory Visit: Payer: BLUE CROSS/BLUE SHIELD | Admitting: Internal Medicine

## 2015-03-12 VITALS — BP 109/65 | HR 75 | Resp 16 | Wt 162.3 lb

## 2015-03-12 DIAGNOSIS — Z510 Encounter for antineoplastic radiation therapy: Secondary | ICD-10-CM | POA: Diagnosis not present

## 2015-03-12 DIAGNOSIS — C7951 Secondary malignant neoplasm of bone: Secondary | ICD-10-CM

## 2015-03-12 NOTE — Telephone Encounter (Signed)
He cancelled his PET because [facility] said he does not need it now , but needs it later. asking about other appts next week for lab , MD and infusion. Note to [place].

## 2015-03-12 NOTE — Progress Notes (Signed)
   Department of Radiation Oncology  Phone:  [phone] Fax:        [phone]  Weekly Treatment Note    Name: Bill Valdez Date: 03/12/2015 MRN: [id number] DOB: [birth year]   Current dose: 8 Gy  Current fraction:2   MEDICATIONS: Current Outpatient Prescriptions  Medication Sig Dispense Refill  . acetaminophen (TYLENOL) 325 MG tablet Take by mouth.    . lidocaine-prilocaine (EMLA) cream Apply 1 application topically as needed. 30 g 0  . nicotine polacrilex (NICORETTE) 4 MG gum Take 4 mg by mouth as needed for smoking cessation.    . prochlorperazine (COMPAZINE) 10 MG tablet Take 1 tablet (10 mg total) by mouth every 6 (six) hours as needed for nausea or vomiting. 30 tablet 0  . Wound Cleansers (RADIAPLEX EX) Apply topically.     No current facility-administered medications for this encounter.     ALLERGIES: Penicillins   LABORATORY DATA:  Lab Results  Component Value Date   WBC 8.5 03/09/2015   HGB 13.4 03/09/2015   HCT 40.0 03/09/2015   MCV 91.2 03/09/2015   PLT 314 03/09/2015   Lab Results  Component Value Date   NA 137 03/09/2015   K 4.8 03/09/2015   CL 103 01/13/2012   CO2 27 03/09/2015   Lab Results  Component Value Date   ALT 13 03/09/2015   AST 26 03/09/2015   ALKPHOS 93 03/09/2015   BILITOT 0.55 03/09/2015     NARRATIVE: Bill Valdez was seen today for weekly treatment management. The chart was checked and the patient's films were reviewed.  Weight and vitals stable. Reports bilateral hip and low back pain 7 on a scale of 0-10. Patient reports he has taken one Tylenol tablet this morning for pain without much relief. Patient confirms he doesn't like to take pain medication. Patient questions if he can take Motrin. No edema of either lower extremity noted. Confirms he does have control of his bowels and bladder. Reports one episode of numbness in the toes of his left legs that resolved. Patient understands he can use the remain radiaplex from  his chest treatment on his hip should it become dry and/or itchy.   BP 109/65 mmHg  Pulse 75  Resp 16  Wt 162 lb 4.8 oz (73.619 kg)  SpO2 100% Wt Readings from Last 3 Encounters:  03/12/15 162 lb 4.8 oz (73.619 kg)  03/03/15 162 lb (73.483 kg)  02/26/15 165 lb 4.8 oz (74.98 kg)     PHYSICAL EXAMINATION: weight is 162 lb 4.8 oz (73.619 kg). His blood pressure is 109/65 and his pulse is 75. His respiration is 16 and oxygen saturation is 100%.        ASSESSMENT: The patient is doing satisfactorily with treatment.  PLAN: We will continue with the patient's radiation treatment as planned.

## 2015-03-12 NOTE — Progress Notes (Addendum)
Weight and vitals stable. Reports bilateral hip and low back pain 7 on a scale of 0-10. Patient reports he has taken one Tylenol tablet this morning for pain without much relief. Patient confirms he doesn't like to take pain medication. Patient questions if he can take Motrin. No edema of either lower extremity noted. Confirms he does have control of his bowels and bladder. Reports one episode of numbness in the toes of his left legs that resolved. Patient understands he can use the remain radiaplex from his chest treatment on his hip should it become dry and/or itchy.   BP 109/65 mmHg  Pulse 75  Resp 16  Wt 162 lb 4.8 oz (73.619 kg)  SpO2 100% Wt Readings from Last 3 Encounters:  03/12/15 162 lb 4.8 oz (73.619 kg)  03/03/15 162 lb (73.483 kg)  02/26/15 165 lb 4.8 oz (74.98 kg)

## 2015-03-15 ENCOUNTER — Ambulatory Visit (HOSPITAL_COMMUNITY): Payer: BLUE CROSS/BLUE SHIELD

## 2015-03-15 ENCOUNTER — Other Ambulatory Visit (HOSPITAL_COMMUNITY): Payer: Self-pay | Admitting: Surgery

## 2015-03-15 ENCOUNTER — Ambulatory Visit
Admission: RE | Admit: 2015-03-15 | Discharge: 2015-03-15 | Disposition: A | Discharge status: Home / Self Care | Payer: BLUE CROSS/BLUE SHIELD | Source: Ambulatory Visit | Attending: Radiation Oncology | Admitting: Radiation Oncology

## 2015-03-15 DIAGNOSIS — Z510 Encounter for antineoplastic radiation therapy: Secondary | ICD-10-CM | POA: Diagnosis not present

## 2015-03-16 ENCOUNTER — Telehealth: Payer: Self-pay | Admitting: *Deleted

## 2015-03-16 ENCOUNTER — Ambulatory Visit: Payer: BLUE CROSS/BLUE SHIELD | Admitting: Internal Medicine

## 2015-03-16 ENCOUNTER — Ambulatory Visit
Admission: RE | Admit: 2015-03-16 | Discharge: 2015-03-16 | Disposition: A | Discharge status: Home / Self Care | Payer: BLUE CROSS/BLUE SHIELD | Source: Ambulatory Visit | Attending: Radiation Oncology | Admitting: Radiation Oncology

## 2015-03-16 ENCOUNTER — Other Ambulatory Visit: Payer: BLUE CROSS/BLUE SHIELD

## 2015-03-16 ENCOUNTER — Telehealth: Payer: Self-pay | Admitting: Internal Medicine

## 2015-03-16 ENCOUNTER — Ambulatory Visit: Payer: BLUE CROSS/BLUE SHIELD

## 2015-03-16 DIAGNOSIS — Z510 Encounter for antineoplastic radiation therapy: Secondary | ICD-10-CM | POA: Diagnosis not present

## 2015-03-16 NOTE — Telephone Encounter (Signed)
Per MD cancel all pt appts at this time. Pt currently receiving treatment at [facility] and followed by MD.  Instructed pt per MD to call us to schedule an appt with Dr. Julien Nordmann if he needs to be seen for any reason. Pt questioned if lab work could be done here at [facility] as it is closer. Discussed with pt to have [facility] send the order and protocol of what labs and how often to Dr. Julien Nordmann and we would assist with scheduling these appts. Pt verbalized understanding will call office with concerns or questions. POF to scheduling to cancel pt appts.

## 2015-03-16 NOTE — Telephone Encounter (Signed)
cx all appts per pof pt now going to [facility]

## 2015-03-16 NOTE — Telephone Encounter (Signed)
Oncology Nurse Navigator Documentation  Oncology Nurse Navigator Flowsheets 03/16/2015  Navigator Encounter Type Telephone/Dr. Julien Nordmann updated me patient is getting treatment at another facility.  I called to check on patient and to let him know we are here if he needs [place].    Patient Visit Type Follow-up  Treatment Phase Treatment  Interventions Other  Coordination of Care -  Time Spent with Patient 15

## 2015-03-17 ENCOUNTER — Ambulatory Visit: Payer: BLUE CROSS/BLUE SHIELD

## 2015-03-17 ENCOUNTER — Ambulatory Visit
Admission: RE | Admit: 2015-03-17 | Discharge: 2015-03-17 | Disposition: A | Discharge status: Home / Self Care | Payer: BLUE CROSS/BLUE SHIELD | Source: Ambulatory Visit | Attending: Radiation Oncology | Admitting: Radiation Oncology

## 2015-03-17 ENCOUNTER — Encounter: Payer: Self-pay | Admitting: Radiation Oncology

## 2015-03-17 DIAGNOSIS — Z510 Encounter for antineoplastic radiation therapy: Secondary | ICD-10-CM | POA: Diagnosis not present

## 2015-03-18 ENCOUNTER — Ambulatory Visit: Payer: BLUE CROSS/BLUE SHIELD

## 2015-03-19 ENCOUNTER — Ambulatory Visit: Payer: BLUE CROSS/BLUE SHIELD

## 2015-03-21 NOTE — Progress Notes (Signed)
  Radiation Oncology         [phone]) [phone] ________________________________  Name: Aquiles Ruffini MRN: [id number]  Date: 03/17/2015  DOB: 1961-02-27  End of Treatment Note   ICD-9-CM ICD-10-CM    1. 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  The SVC syndrome was treated to 19 Gy with one fraction of 4 Gy then 5 fractions of 3 Gy 2.  The solitary brain met was treated using SRS 3.  The Left hip metastasis was treated to 20 Gy in 5 fractions  Beams/energy:    1.  The SVC syndrome was treated using AP/PA beams covering the upper mediastinum 2.  The solitary Right Frontal 3 mm target was treated using 2 Circular Arcs to a prescription dose of 20 Gy.  ExacTrac registration was performed for each couch angle.  The 82% isodose line was prescribed. The patient was treated with the 7.24mm collimators.  6 MV X-rays were delivered in the flattening filter free beam mode. 3.  The Left hip metastasis was treated using AP/PA beams  Narrative: The patient tolerated radiation treatment relatively well.   His SVC symptoms greatly improved.  He had no significant toxicities.  Plan: The patient has completed radiation treatment. The patient will return to radiation oncology clinic for routine followup in one month. I advised him to call or return sooner if he has any questions or concerns related to his recovery or treatment. ________________________________  Sheral Apley. Tammi Klippel, M.D.

## 2015-03-22 ENCOUNTER — Ambulatory Visit: Payer: BLUE CROSS/BLUE SHIELD

## 2015-03-23 ENCOUNTER — Ambulatory Visit: Payer: BLUE CROSS/BLUE SHIELD

## 2015-03-23 ENCOUNTER — Other Ambulatory Visit: Payer: BLUE CROSS/BLUE SHIELD

## 2015-03-24 ENCOUNTER — Ambulatory Visit: Payer: BLUE CROSS/BLUE SHIELD

## 2015-03-30 ENCOUNTER — Other Ambulatory Visit: Payer: BLUE CROSS/BLUE SHIELD

## 2015-03-30 ENCOUNTER — Ambulatory Visit: Payer: BLUE CROSS/BLUE SHIELD

## 2015-04-06 ENCOUNTER — Other Ambulatory Visit: Payer: BLUE CROSS/BLUE SHIELD

## 2015-04-06 ENCOUNTER — Ambulatory Visit: Payer: BLUE CROSS/BLUE SHIELD | Admitting: Internal Medicine

## 2015-04-06 ENCOUNTER — Ambulatory Visit: Payer: BLUE CROSS/BLUE SHIELD

## 2015-04-12 ENCOUNTER — Ambulatory Visit
Admission: RE | Admit: 2015-04-12 | Discharge: 2015-04-12 | Disposition: A | Discharge status: Home / Self Care | Payer: BLUE CROSS/BLUE SHIELD | Source: Ambulatory Visit | Attending: Radiation Oncology | Admitting: Radiation Oncology

## 2015-04-12 ENCOUNTER — Telehealth: Payer: Self-pay | Admitting: Radiation Oncology

## 2015-04-12 NOTE — Telephone Encounter (Signed)
Patient left message. He explained he left a message on Saturday that he needed to cancel his follow up with Dr. Tammi Klippel. He explains through voicemail he is receiving immunotherapy in a clinical trial at [facility]. Also, patient reports he had a scan of his brain at [facility] last Thursday. He confirms he can be reached at [phone].

## 2015-04-12 NOTE — Telephone Encounter (Signed)
Patient has not shown for 0900 appointment. Phoned patient. No answer. Left message requesting patient call this RN or [place] back to reschedule.

## 2015-04-13 ENCOUNTER — Other Ambulatory Visit: Payer: BLUE CROSS/BLUE SHIELD

## 2015-04-13 ENCOUNTER — Ambulatory Visit: Payer: BLUE CROSS/BLUE SHIELD

## 2015-04-20 ENCOUNTER — Other Ambulatory Visit: Payer: BLUE CROSS/BLUE SHIELD

## 2015-04-20 ENCOUNTER — Ambulatory Visit: Payer: BLUE CROSS/BLUE SHIELD

## 2015-04-27 ENCOUNTER — Ambulatory Visit: Payer: BLUE CROSS/BLUE SHIELD

## 2015-04-27 ENCOUNTER — Other Ambulatory Visit: Payer: BLUE CROSS/BLUE SHIELD

## 2015-04-27 ENCOUNTER — Ambulatory Visit: Payer: BLUE CROSS/BLUE SHIELD | Admitting: Internal Medicine

## 2015-05-04 ENCOUNTER — Other Ambulatory Visit: Payer: BLUE CROSS/BLUE SHIELD

## 2015-05-04 ENCOUNTER — Ambulatory Visit: Payer: BLUE CROSS/BLUE SHIELD

## 2015-05-11 ENCOUNTER — Other Ambulatory Visit: Payer: BLUE CROSS/BLUE SHIELD

## 2015-05-11 ENCOUNTER — Ambulatory Visit: Payer: BLUE CROSS/BLUE SHIELD

## 2015-05-14 ENCOUNTER — Telehealth: Payer: Self-pay | Admitting: Internal Medicine

## 2015-05-14 ENCOUNTER — Other Ambulatory Visit: Payer: Self-pay | Admitting: Internal Medicine

## 2015-05-14 DIAGNOSIS — C3411 Malignant neoplasm of upper lobe, right bronchus or lung: Secondary | ICD-10-CM

## 2015-05-14 DIAGNOSIS — C787 Secondary malignant neoplasm of liver and intrahepatic bile duct: Secondary | ICD-10-CM

## 2015-05-14 NOTE — Telephone Encounter (Signed)
lvm fo rpt regarding to [place] appt

## 2015-05-18 ENCOUNTER — Ambulatory Visit: Payer: BLUE CROSS/BLUE SHIELD

## 2015-05-18 ENCOUNTER — Other Ambulatory Visit: Payer: BLUE CROSS/BLUE SHIELD

## 2015-05-18 ENCOUNTER — Ambulatory Visit: Payer: BLUE CROSS/BLUE SHIELD | Admitting: Internal Medicine

## 2015-05-19 ENCOUNTER — Encounter: Payer: Self-pay | Admitting: Internal Medicine

## 2015-05-19 ENCOUNTER — Telehealth: Payer: Self-pay | Admitting: *Deleted

## 2015-05-19 ENCOUNTER — Telehealth: Payer: Self-pay | Admitting: Internal Medicine

## 2015-05-19 ENCOUNTER — Ambulatory Visit (HOSPITAL_BASED_OUTPATIENT_CLINIC_OR_DEPARTMENT_OTHER): Payer: BLUE CROSS/BLUE SHIELD | Admitting: Internal Medicine

## 2015-05-19 ENCOUNTER — Telehealth: Payer: Self-pay

## 2015-05-19 ENCOUNTER — Other Ambulatory Visit (HOSPITAL_BASED_OUTPATIENT_CLINIC_OR_DEPARTMENT_OTHER): Payer: BLUE CROSS/BLUE SHIELD

## 2015-05-19 VITALS — BP 105/61 | HR 106 | Resp 17 | Ht 71.0 in | Wt 165.6 lb

## 2015-05-19 DIAGNOSIS — C787 Secondary malignant neoplasm of liver and intrahepatic bile duct: Secondary | ICD-10-CM | POA: Diagnosis not present

## 2015-05-19 DIAGNOSIS — R634 Abnormal weight loss: Secondary | ICD-10-CM

## 2015-05-19 DIAGNOSIS — E46 Unspecified protein-calorie malnutrition: Secondary | ICD-10-CM

## 2015-05-19 DIAGNOSIS — C3411 Malignant neoplasm of upper lobe, right bronchus or lung: Secondary | ICD-10-CM

## 2015-05-19 DIAGNOSIS — R630 Anorexia: Secondary | ICD-10-CM

## 2015-05-19 DIAGNOSIS — C7951 Secondary malignant neoplasm of bone: Secondary | ICD-10-CM

## 2015-05-19 DIAGNOSIS — I871 Compression of vein: Secondary | ICD-10-CM

## 2015-05-19 DIAGNOSIS — C3490 Malignant neoplasm of unspecified part of unspecified bronchus or lung: Secondary | ICD-10-CM

## 2015-05-19 DIAGNOSIS — C7931 Secondary malignant neoplasm of brain: Secondary | ICD-10-CM

## 2015-05-19 LAB — COMPREHENSIVE METABOLIC PANEL
ALBUMIN: 2.4 g/dL — AB (ref 3.5–5.0)
ALK PHOS: 129 U/L (ref 40–150)
ALT: 15 U/L (ref 0–55)
ANION GAP: 12 meq/L — AB (ref 3–11)
AST: 27 U/L (ref 5–34)
BILIRUBIN TOTAL: 0.8 mg/dL (ref 0.20–1.20)
BUN: 12.5 mg/dL (ref 7.0–26.0)
CALCIUM: 8.8 mg/dL (ref 8.4–10.4)
CO2: 20 mEq/L — ABNORMAL LOW (ref 22–29)
Chloride: 98 mEq/L (ref 98–109)
Creatinine: 0.7 mg/dL (ref 0.7–1.3)
Glucose: 95 mg/dl (ref 70–140)
Potassium: 3.8 mEq/L (ref 3.5–5.1)
Sodium: 130 mEq/L — ABNORMAL LOW (ref 136–145)
TOTAL PROTEIN: 7 g/dL (ref 6.4–8.3)

## 2015-05-19 LAB — CBC WITH DIFFERENTIAL/PLATELET
BASO%: 0.2 % (ref 0.0–2.0)
Basophils Absolute: 0 10*3/uL (ref 0.0–0.1)
EOS ABS: 0 10*3/uL (ref 0.0–0.5)
EOS%: 0.2 % (ref 0.0–7.0)
HEMATOCRIT: 32.1 % — AB (ref 38.4–49.9)
HEMOGLOBIN: 10.6 g/dL — AB (ref 13.0–17.1)
LYMPH%: 5.6 % — AB (ref 14.0–49.0)
MCH: 28 pg (ref 27.2–33.4)
MCHC: 33 g/dL (ref 32.0–36.0)
MCV: 84.9 fL (ref 79.3–98.0)
MONO#: 1.2 10*3/uL — AB (ref 0.1–0.9)
MONO%: 7.5 % (ref 0.0–14.0)
NEUT%: 86.5 % — ABNORMAL HIGH (ref 39.0–75.0)
NEUTROS ABS: 13.6 10*3/uL — AB (ref 1.5–6.5)
PLATELETS: 392 10*3/uL (ref 140–400)
RBC: 3.78 10*6/uL — AB (ref 4.20–5.82)
RDW: 14.6 % (ref 11.0–14.6)
WBC: 15.7 10*3/uL — AB (ref 4.0–10.3)
lymph#: 0.9 10*3/uL (ref 0.9–3.3)

## 2015-05-19 MED ORDER — DOXYCYCLINE HYCLATE 100 MG PO TABS: 100.0000 mg | ORAL_TABLET | Freq: Two times a day (BID) | ORAL | Status: DC

## 2015-05-19 MED ORDER — METHYLPREDNISOLONE 4 MG PO TBPK: ORAL_TABLET | ORAL | Status: DC

## 2015-05-19 MED ORDER — PROCHLORPERAZINE MALEATE 10 MG PO TABS: 10.0000 mg | ORAL_TABLET | Freq: Four times a day (QID) | ORAL | Status: DC | PRN

## 2015-05-19 NOTE — Progress Notes (Deleted)
Hematology and Oncology Follow Up Visit  Edu On [id number] Oct 06, 1960 54 y.o. 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  PRIOR THERAPY: Currently undergoing palliative radiotherapy to the large right upper lobe lung mass under the care of Dr. Tammi Klippel  CURRENT THERAPY: Systemic chemotherapy with carboplatin for AUC of 5 on day 1 and Abraxane 100 MG/M2 on days 1, 8 and 15 every 3 weeks. First dose is expected on 03/09/2015.  Interim History:   Bill Valdez 54 y.o. male returns to the clinic today for follow-up visit accompanied by his sister and his brother-in-law who is a neurologist in [facility]. The patient is currently undergoing palliative radiotherapy to the right upper lobe lung mass under the care of Dr. Tammi Klippel status post 4 fractions. He is feeling a little bit better with less congestion in the neck area. The patient also complained of pain in the hip area more on the right than the left. He denied having any significant chest pain or shortness of breath. He has no significant weight loss or night sweats. No headache or visual changes. The patient recently had several studies performed including a PET scan as well as MRI of the brain in addition to ultrasound-guided core biopsy of one of the liver lesion by interventional radiology. The final pathology (Accession: [id number]) was consistent with metastatic poorly differentiated squamous cell carcinoma. Tumor demonstrates the following immunophenotype: Cytokeratin AE1/3 - diffuse moderate strong expression.Cytokeratin 5/6 - diffuse moderate strong expression. CD56 - negative expression. Synaptophysin - negative expression. TTF-1 - negative expression. p63 - strong diffuse  expression. Overall the morphology and immunophenotype are that of metastatic poorly differentiated squamous cell carcinoma. The patient is here today for evaluation and discussion of his treatment options.  Denies fevers, chills, night sweats, vision changes, or mucositis. Denies any respiratory complaints. Denies any chest pain or palpitations. Denies lower extremity swelling. Denies nausea, heartburn or change in bowel habits. Appetite is normal. Denies any dysuria. Denies abnormal skin rashes, or neuropathy. Denies any bleeding issues such as epistaxis, hematemesis, hematuria or hematochezia. Ambulating without difficulty.    Medications:  Reviewed and updated  Allergies:  Allergies  Allergen Reactions  . Penicillins Other (See Comments)    Childhood Has patient had a PCN reaction causing immediate rash, facial/tongue/throat swelling, SOB or lightheadedness with hypotension: {Yes/No:30480221} Has patient had a PCN reaction causing severe rash involving mucus membranes or skin necrosis: {Yes/No:30480221} Has patient had a PCN reaction that required hospitalization {Yes/No:30480221} Has patient had a PCN reaction occurring within the last 10 years: {Yes/No:30480221} If all of the above answers are "NO", then may proceed with Cephalospor    Past Medical History, Surgical history, Social history, and Family History were reviewed and updated.  Review of Systems: ROS  REVIEW OF SYSTEMS: Constitutional: negative Eyes: negative Ears, nose, mouth, throat, and face: negative Respiratory: positive for cough Cardiovascular: negative Gastrointestinal: negative Genitourinary:negative Integument/breast: negative Hematologic/lymphatic: negative Musculoskeletal:positive for back pain and bone pain Neurological: negative Behavioral/Psych: negative Endocrine: negative Allergic/Immunologic: negative  Remaining ROS negative.  Physical Exam: There were no vitals taken for this  visit.  Physical Exam  PHYSICAL EXAMINATION: General appearance: alert, cooperative and no distress Head: Normocephalic, without obvious abnormality, atraumatic Neck: no adenopathy, no JVD, supple, symmetrical, trachea midline and thyroid not enlarged,  symmetric, no tenderness/mass/nodules Lymph nodes: Cervical, supraclavicular, and axillary nodes normal. Resp: clear to auscultation bilaterally Back: symmetric, no curvature. ROM normal. No CVA tenderness. Cardio: regular rate and rhythm, S1, S2 normal, no murmur, click, rub or gallop GI: soft, non-tender; bowel sounds normal; no masses, no organomegaly Extremities: extremities normal, atraumatic, no cyanosis or edema Neurologic: Alert and oriented X 3, normal strength and tone. Normal symmetric reflexes. Normal coordination and gait  ECOG PERFORMANCE STATUS: 1 - Symptomatic but completely ambulatory LABS: CBC Latest Ref Rng 03/09/2015 03/03/2015 02/26/2015  WBC 4.0 - 10.3 10e3/uL 8.5 7.7 7.9  Hemoglobin 13.0 - 17.1 g/dL 13.4 12.4(L) 12.2(L)  Hematocrit 38.4 - 49.9 % 40.0 37.3(L) 36.6(L)  Platelets 140 - 400 10e3/uL 314 342 289   CMP Latest Ref Rng 03/09/2015 02/26/2015 02/25/2015  Glucose 70 - 140 mg/dl 94 87 92  BUN 7.0 - 26.0 mg/dL 13.2 10.1 13.8  Creatinine 0.7 - 1.3 mg/dL 0.8 0.8 0.7  Sodium 136 - 145 mEq/L 137 134(L) 135(L)  Potassium 3.5 - 5.1 mEq/L 4.8 4.2 4.4  Chloride 96 - 112 mEq/L - - -  CO2 22 - 29 mEq/L '27 26 24  '$ Calcium 8.4 - 10.4 mg/dL 10.0 9.3 9.3  Total Protein 6.4 - 8.3 g/dL 7.9 7.1 7.5  Total Bilirubin 0.20 - 1.20 mg/dL 0.55 0.61 0.41  Alkaline Phos 40 - 150 U/L 93 82 87  AST 5 - 34 U/L '26 21 23  '$ ALT 0 - 55 U/L 13 <9 <9    Radiological Studies: No results found.  Impression and Plan:  ASSESSMENT AND PLAN: This is a very pleasant 54 years old white male recently diagnosed with stage IV non-small cell lung cancer, poorly differentiated squamous cell carcinoma presented with large suprahilar right upper lobe  mass in addition to mediastinal lymphadenopathy as well as extensive liver and bone disease involving the spines, hips, ribs, skull and subcutaneous nodule in the left chest wall. The patient also has a suspicious 3 mm lesion in the brain for which Dr. Tammi Klippel gave 20 cGy in 1 fraction on 9/23 I had a lengthy and extensive discussion with the patient and his family today about his current disease stage, prognosis and treatment options. He is on palliative radiotherapy to the large right upper lobe lung mass and received palliative radiotherapy to the metastatic bone lesion in the hip area. He was also advised to avoid any heavy lifting.  I discussed with the patient his treatment options including palliative care and hospice referral versus consideration of systemic chemotherapy. He is interested in treatment and I gave him 2 options for first line treatment including treatment with versus the second regimen with carboplatin for AUC of 5 on day 1, gemcitabine 1000 MG/M2 on days 1 and 8 plus/minus Portrazza 800 mg on days 1 and 8 every 3 weeks. He is to begin treatment with carboplatin and Abraxane. carboplatin for AUC of 5 on day 1 and Abraxane 100 MG/M2 on days 1, 8 and 15 every 3 weeks after visiting Dr. Durenda Hurt or Dr. Sharlet Salina at [facility] next week for a second opinion. He had a dental clearance in the interim before starting him on treatment with Xgeva. He was also advised to take calcium supplements on daily basis. The patient would come back for follow-up visit in 2 weeks for reevaluation and management of any adverse effect of his treatment. He was advised to call immediately if he has any concerning symptoms in the interval. I gave the  patient and his family the time to ask questions and I answered them completely to their satisfaction. The patient voices understanding of current disease status and treatment options and is in agreement with the current care plan.  All  questions were answered. The patient knows to call the clinic with any problems, questions or concerns. We can certainly see the patient much sooner if necessary.  This is a shared visit. Plan discussed with Dr. Julien Nordmann.    Pj Zehner E, PA-C 12/14/201611:30 AM

## 2015-05-19 NOTE — Telephone Encounter (Signed)
Call placed to [facility] [address] to re route prescriptions to [facility] at [employer] per pt. That is the [facility] he will be using. Pharmacy updated.

## 2015-05-19 NOTE — Telephone Encounter (Signed)
appts foxed

## 2015-05-19 NOTE — Telephone Encounter (Signed)
Per staff message and POF I have scheduled appts. Advised scheduler of appts and that MD visit on 1/18 is to late patietn needs to be on chemo by 9am. JMW

## 2015-05-19 NOTE — Telephone Encounter (Signed)
Gave and printed appt sched and avs for pt for DEC adn Jan

## 2015-05-19 NOTE — Telephone Encounter (Signed)
Gave and printed appt sched and avs for pt; for DEC and Jan  °

## 2015-05-19 NOTE — Progress Notes (Addendum)
[place] [phone]   [phone]  OFFICE PROGRESS NOTE  Bill Huh, MD 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  PRIOR THERAPY:  1) Palliative radiotherapy to the large right upper lobe lung mass under the care of Dr. Tammi Klippel. 2) treatment on BMS 568 (nivolumab/ipilumumab) at [place], discontinued recently secondary to disease progression   CURRENT THERAPY: Systemic chemotherapy with cisplatin 75 MG/M2 on day 1, Gemcitabine 1000 MG/M2 on days 1, 8 and  Portrazza 800 mg on days 1 and 8 every 3 weeks. First dose is expected on 05/26/2015.  INTERVAL HISTORY: Bill [place] 54 y.o. male returns to the clinic today for follow-up visit accompanied by his girlfriend and his brother-in-law who is a neurologist in [employer]. The patient was treated recently at [facility] with immunotherapy with Ipilumumab and Nivolumab for 2 cycles but unfortunately has evidence for disease progression on restaging workup. He came back today for reevaluation and consideration of treatment with chemotherapy. 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 The patient also has lack of appetite and recent weight loss. He has no significant shortness of breath but has dry cough with no hemoptyses. He has no nausea or vomiting, no fever or chills.  MEDICAL HISTORY: Past Medical History  Diagnosis Date  . Shortness of breath dyspnea   . 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  Radiation 02/26/2015    ALLERGIES:  is allergic to penicillins.  MEDICATIONS:  Current Outpatient Prescriptions  Medication Sig Dispense Refill  . acetaminophen (TYLENOL) 325 MG tablet Take by mouth.    . lidocaine-prilocaine (EMLA) cream Apply 1 application topically as needed. 30 g 0  . nicotine polacrilex (NICORETTE) 4 MG gum Take 4 mg by mouth as needed for smoking cessation.    . prochlorperazine (COMPAZINE) 10 MG tablet Take 1 tablet (10 mg total) by mouth every 6 (six) hours as needed for nausea or vomiting. 30 tablet 0  . Wound Cleansers (RADIAPLEX EX) Apply topically.     No current facility-administered medications for this visit.    SURGICAL HISTORY:  Past Surgical History  Procedure Laterality Date  . Hernia repair    . Cyst removal neck      REVIEW OF SYSTEMS:  Constitutional: positive for anorexia, fatigue and weight loss Eyes: negative Ears, nose, mouth, throat, and face: positive for swelling of face and neck Respiratory: positive for cough Cardiovascular: negative Gastrointestinal: negative Genitourinary:negative Integument/breast: negative Hematologic/lymphatic: negative Musculoskeletal:positive for bone pain Neurological: negative Behavioral/Psych: negative Endocrine: negative Allergic/Immunologic: negative   PHYSICAL EXAMINATION: General appearance: alert, cooperative and no distress Head: Normocephalic, without obvious abnormality, atraumatic Neck: no adenopathy, no JVD, supple, symmetrical, trachea midline, thyroid not enlarged, symmetric, no tenderness/mass/nodules and swelling of the neck. Lymph nodes: Cervical, supraclavicular, and axillary nodes normal. Resp: diminished breath sounds RLL and dullness to percussion RLL Back: symmetric, no curvature. ROM normal. No CVA tenderness. Cardio: regular rate and rhythm, S1, S2 normal, no murmur, click, rub or gallop GI: soft, non-tender; bowel sounds normal; no  masses,  no organomegaly Extremities: edema  upper extremities. Neurologic: Alert and oriented X 3, normal strength and tone. Normal symmetric reflexes. Normal coordination and gait  ECOG PERFORMANCE STATUS: 1 - Symptomatic but completely ambulatory  Blood pressure 105/61, pulse 106, resp. rate 17, height '5\' 11"'$  (1.803 m), weight 165 lb 9.6 oz (75.116 kg), SpO2 96 %.  LABORATORY DATA: Lab Results  Component Value Date   WBC 15.7* 05/19/2015   HGB 10.6* 05/19/2015   HCT 32.1* 05/19/2015   MCV 84.9 05/19/2015   PLT 392 05/19/2015      Chemistry      Component Value Date/Time   NA 137 03/09/2015 0921   NA 137 01/13/2012 1052   K 4.8 03/09/2015 0921   K 4.7 01/13/2012 1052   CL 103 01/13/2012 1052   CO2 27 03/09/2015 0921   CO2 30 01/13/2012 1052   BUN 13.2 03/09/2015 0921   BUN 14 01/13/2012 1052   CREATININE 0.8 03/09/2015 0921   CREATININE 0.87 01/13/2012 1052      Component Value Date/Time   CALCIUM 10.0 03/09/2015 0921   CALCIUM 9.4 01/13/2012 1052   ALKPHOS 93 03/09/2015 0921   ALKPHOS 48 01/13/2012 1052   AST 26 03/09/2015 0921   AST 14 01/13/2012 1052   ALT 13 03/09/2015 0921   ALT 9 01/13/2012 1052   BILITOT 0.55 03/09/2015 0921   BILITOT 0.4 01/13/2012 1052       RADIOGRAPHIC STUDIES: CT SCAN OF THE CHEST ([place] 05/13/15) Impression:  1.Interval increase in the size of the right mediastinal mass with new obstruction of the right lower lobe bronchus with associated postobstructive bronchiectasis. 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 3.New left pulmonary nodules and sclerotic lesions in the sternum and T2 and T7 vertebral body as well as enlarging bilateral axillary and mediastinal lymphadenopathy and subcutaneous soft tissue masses, compatible with disease progression. 4.Increased interlobular septal thickening in the left base which may be secondary to low lung volumes and atelectasis but  raises the possibility of lymphangitic tumor spread  CT SCAN OF THE ABDOMEN AND PELVIS  ([place] 05/13/15) Impression: 1. Interval increase in size and in number of hepatic lesions, concerning for worsening metastatic disease. 2. Interval increase of right iliacus lesion with associated bony destruction, concerning for worsening disease. 3. Newly appreciated subcentimeter hypoattenuating lesions in the spleen, which may represent metastatic disease.  4. Redemonstration of multifocal lytic lesions in the axial and appendicular spine, concerning for osseous metastatic disease. Redemonstration of compression fractures of L3 and T12 vertebral body with interval pathologic compression fracture of L2 vertebral body. 5. Interval perfusional abnormality noted in the left liver and in the hepatic segment 4, which is likely related shunt due to SVC occlusion. 6. Please see separately dictated chest for findings above the diaphragm. The preliminary report (critical or emergent communication) was reviewed prior to this dictation and there are no substantial differences between the preliminary results and the impressions in this final report.   ASSESSMENT AND PLAN: This is a very pleasant 54 years old white male recently diagnosed with stage IV non-small cell lung cancer, poorly differentiated squamous cell carcinoma with negative PDL 1 expression, presented with large suprahilar right upper lobe mass in addition to mediastinal lymphadenopathy as well as extensive liver and bone disease involving the spines, hips, ribs, skull and subcutaneous nodule in the left chest wall. The patient also has a suspicious  3 mm lesion in the brain. He was treated on Clinical trial with first line immunotherapy with Ipilumumab and Nivolumab at [facility] but unfortunately has evidence for disease progression after 2 cycles. He is here today for reevaluation and discussion of treatment options. I  discussed with the patient again his treatment options including palliative care and hospice referral versus consideration of systemic chemotherapy. He is interested in treatment and I gave him 2 options for first line treatment including treatment with carboplatin for AUC of 5 on day 1 and Abraxane 100 MG/M2 on days 1, 8 and 15 every 3 weeks but he has concern about peripheral neuropathy with Abraxane and he declined to proceed with this treatment. The second regimen with Cisplatin 75 MG/M2 on day 1, gemcitabine 1000 MG/M2 on days 1 and 8 and Portrazza 800 mg on days 1 and 8 every 3 weeks. I considered the patient initially for treatment with carboplatin instead of cisplatin because of toxicity but this was denied by insurance. She'll be treated with the standard treatment of cisplatin, gemcitabine and Portrazza. The patient agreed to this option. I discussed the adverse effect of this treatment with the patient and his family including but not limited to alopecia, nausea and vomiting, myelosuppression, peripheral neuropathy, liver or renal dysfunction. I also discussed with the patient the adverse effect of Portrazza including skin rash, diarrhea, hypomagnesemia and risk of cardiac arrest. He is expected to start the first cycle of this treatment next week. For the bone disease, I requested the patient to have a dental clearance before starting him on treatment with Xgeva. He was also advised to take calcium supplements on daily basis. The patient would come back for follow-up visit in 2 weeks for reevaluation and management of any adverse effect of his treatment. For the lack of appetite, I will start him on Medrol dose pak and I referred him to the dietation at the [facility]  I will also refer the patient to interventional radiology for evaluation and and consideration of stent placement for the SVC occlusion. He was advised to call immediately if he has any concerning symptoms in the interval. I  gave the patient and his family the time to ask questions and I answered them completely to their satisfaction. The patient voices understanding of current disease status and treatment options and is in agreement with the current care plan.  All questions were answered. The patient knows to call the clinic with any problems, questions or concerns. We can certainly see the patient much sooner if necessary.  I spent 20 minutes counseling the patient face to face. The total time spent in the appointment was 30 minutes.  Disclaimer: This note was dictated with voice recognition software. Similar sounding words can inadvertently be transcribed and may not be corrected upon review.

## 2015-05-25 ENCOUNTER — Ambulatory Visit: Payer: BLUE CROSS/BLUE SHIELD

## 2015-05-25 ENCOUNTER — Encounter: Payer: Self-pay | Admitting: *Deleted

## 2015-05-25 ENCOUNTER — Other Ambulatory Visit: Payer: BLUE CROSS/BLUE SHIELD

## 2015-05-26 ENCOUNTER — Other Ambulatory Visit (HOSPITAL_BASED_OUTPATIENT_CLINIC_OR_DEPARTMENT_OTHER): Payer: BLUE CROSS/BLUE SHIELD

## 2015-05-26 ENCOUNTER — Encounter: Payer: Self-pay | Admitting: *Deleted

## 2015-05-26 ENCOUNTER — Other Ambulatory Visit: Payer: Self-pay | Admitting: *Deleted

## 2015-05-26 ENCOUNTER — Ambulatory Visit: Payer: BLUE CROSS/BLUE SHIELD | Admitting: Nutrition

## 2015-05-26 ENCOUNTER — Other Ambulatory Visit: Payer: Self-pay | Admitting: Internal Medicine

## 2015-05-26 ENCOUNTER — Ambulatory Visit (HOSPITAL_BASED_OUTPATIENT_CLINIC_OR_DEPARTMENT_OTHER): Payer: BLUE CROSS/BLUE SHIELD

## 2015-05-26 VITALS — BP 102/64 | HR 125 | Temp 98.1°F | Resp 16

## 2015-05-26 DIAGNOSIS — Z5111 Encounter for antineoplastic chemotherapy: Secondary | ICD-10-CM | POA: Diagnosis not present

## 2015-05-26 DIAGNOSIS — C787 Secondary malignant neoplasm of liver and intrahepatic bile duct: Secondary | ICD-10-CM | POA: Diagnosis not present

## 2015-05-26 DIAGNOSIS — C3411 Malignant neoplasm of upper lobe, right bronchus or lung: Secondary | ICD-10-CM | POA: Diagnosis not present

## 2015-05-26 DIAGNOSIS — C3401 Malignant neoplasm of right main bronchus: Secondary | ICD-10-CM

## 2015-05-26 DIAGNOSIS — C3491 Malignant neoplasm of unspecified part of right bronchus or lung: Secondary | ICD-10-CM

## 2015-05-26 LAB — CBC WITH DIFFERENTIAL/PLATELET
BASO%: 0.2 % (ref 0.0–2.0)
BASOS ABS: 0 10*3/uL (ref 0.0–0.1)
EOS%: 0 % (ref 0.0–7.0)
Eosinophils Absolute: 0 10*3/uL (ref 0.0–0.5)
HEMATOCRIT: 35.7 % — AB (ref 38.4–49.9)
HEMOGLOBIN: 11.8 g/dL — AB (ref 13.0–17.1)
LYMPH#: 1.6 10*3/uL (ref 0.9–3.3)
LYMPH%: 6.2 % — ABNORMAL LOW (ref 14.0–49.0)
MCH: 28 pg (ref 27.2–33.4)
MCHC: 33.1 g/dL (ref 32.0–36.0)
MCV: 84.6 fL (ref 79.3–98.0)
MONO#: 1.1 10*3/uL — AB (ref 0.1–0.9)
MONO%: 4.2 % (ref 0.0–14.0)
NEUT#: 23.4 10*3/uL — ABNORMAL HIGH (ref 1.5–6.5)
NEUT%: 89.4 % — AB (ref 39.0–75.0)
PLATELETS: 546 10*3/uL — AB (ref 140–400)
RBC: 4.22 10*6/uL (ref 4.20–5.82)
RDW: 15.1 % — ABNORMAL HIGH (ref 11.0–14.6)
WBC: 26.2 10*3/uL — ABNORMAL HIGH (ref 4.0–10.3)
nRBC: 0 % (ref 0–0)

## 2015-05-26 LAB — COMPREHENSIVE METABOLIC PANEL
ALBUMIN: 2.4 g/dL — AB (ref 3.5–5.0)
ALK PHOS: 141 U/L (ref 40–150)
ALT: 22 U/L (ref 0–55)
ANION GAP: 10 meq/L (ref 3–11)
AST: 30 U/L (ref 5–34)
BUN: 12.2 mg/dL (ref 7.0–26.0)
CALCIUM: 9.1 mg/dL (ref 8.4–10.4)
CO2: 22 mEq/L (ref 22–29)
Chloride: 93 mEq/L — ABNORMAL LOW (ref 98–109)
Creatinine: 0.7 mg/dL (ref 0.7–1.3)
Glucose: 103 mg/dl (ref 70–140)
POTASSIUM: 5 meq/L (ref 3.5–5.1)
Sodium: 126 mEq/L — ABNORMAL LOW (ref 136–145)
Total Bilirubin: 0.79 mg/dL (ref 0.20–1.20)
Total Protein: 7.2 g/dL (ref 6.4–8.3)

## 2015-05-26 LAB — MAGNESIUM: MAGNESIUM: 2.3 mg/dL (ref 1.5–2.5)

## 2015-05-26 MED ORDER — SODIUM CHLORIDE 0.9 % IJ SOLN
10.0000 mL | INTRAMUSCULAR | Status: DC | PRN
Start: 2015-05-26 — End: 2015-05-26
  Filled 2015-05-26: qty 10

## 2015-05-26 MED ORDER — HEPARIN SOD (PORK) LOCK FLUSH 100 UNIT/ML IV SOLN
500.0000 [IU] | Freq: Once | INTRAVENOUS | Status: DC | PRN
  Filled 2015-05-26: qty 5

## 2015-05-26 MED ORDER — SODIUM CHLORIDE 0.9 % IV SOLN
1000.0000 mg/m2 | Freq: Once | INTRAVENOUS | Status: AC
  Administered 2015-05-26: 1938 mg via INTRAVENOUS
  Filled 2015-05-26: qty 50.97

## 2015-05-26 MED ORDER — SODIUM CHLORIDE 0.9 % IV SOLN
75.0000 mg/m2 | Freq: Once | INTRAVENOUS | Status: AC
  Administered 2015-05-26: 146 mg via INTRAVENOUS
  Filled 2015-05-26: qty 146

## 2015-05-26 MED ORDER — PALONOSETRON HCL INJECTION 0.25 MG/5ML
0.2500 mg | Freq: Once | INTRAVENOUS | Status: AC
  Administered 2015-05-26: 0.25 mg via INTRAVENOUS

## 2015-05-26 MED ORDER — SODIUM CHLORIDE 0.9 % IV SOLN
800.0000 mg | Freq: Once | INTRAVENOUS | Status: AC
  Administered 2015-05-26: 800 mg via INTRAVENOUS
  Filled 2015-05-26: qty 50

## 2015-05-26 MED ORDER — POTASSIUM CHLORIDE 2 MEQ/ML IV SOLN
Freq: Once | INTRAVENOUS | Status: AC
  Administered 2015-05-26: 11:00:00 via INTRAVENOUS
  Filled 2015-05-26: qty 10

## 2015-05-26 MED ORDER — PALONOSETRON HCL INJECTION 0.25 MG/5ML
INTRAVENOUS | Status: AC
  Filled 2015-05-26: qty 5

## 2015-05-26 MED ORDER — ONDANSETRON HCL 8 MG PO TABS: 8.0000 mg | ORAL_TABLET | Freq: Three times a day (TID) | ORAL | Status: AC | PRN

## 2015-05-26 MED ORDER — SODIUM CHLORIDE 0.9 % IV SOLN: Freq: Once | INTRAVENOUS | Status: DC

## 2015-05-26 MED ORDER — SODIUM CHLORIDE 0.9 % IJ SOLN
10.0000 mL | INTRAMUSCULAR | Status: DC | PRN
  Administered 2015-05-26: 10 mL
  Filled 2015-05-26: qty 10

## 2015-05-26 MED ORDER — FOSAPREPITANT DIMEGLUMINE INJECTION 150 MG
Freq: Once | INTRAVENOUS | Status: AC
  Administered 2015-05-26: 14:00:00 via INTRAVENOUS
  Filled 2015-05-26: qty 5

## 2015-05-26 MED ORDER — HEPARIN SOD (PORK) LOCK FLUSH 100 UNIT/ML IV SOLN
500.0000 [IU] | Freq: Once | INTRAVENOUS | Status: AC | PRN
  Administered 2015-05-26: 500 [IU]
  Filled 2015-05-26: qty 5

## 2015-05-26 MED ORDER — SODIUM CHLORIDE 0.9 % IV SOLN
Freq: Once | INTRAVENOUS | Status: AC
  Administered 2015-05-26: 09:00:00 via INTRAVENOUS

## 2015-05-26 NOTE — Progress Notes (Signed)
Oncology Nurse Navigator Documentation  Oncology Nurse Navigator Flowsheets 05/26/2015  Navigator Encounter Type Other/Nurse chemo educator , Thurnell Lose updated Dr. Julien Nordmann that patient was not clear on tx regimin.  Dr. Julien Nordmann asked me to speak with precert team.  I did.  Chemo has been denied for a second time.  Dr. Julien Nordmann called insurance company to clarify.  Dr. Julien Nordmann updated his note and I gave to [facility] to fax.  Dr. Julien Nordmann spoke with Mr. Philbrook to update.    Treatment Phase Treatment  Interventions Other;Coordination of Care  Time Spent with Patient 75

## 2015-05-26 NOTE — Progress Notes (Signed)
54 year old male diagnosed with non-small cell lung cancer with bone metastases.  He is a patient of Dr. Julien Nordmann.  Past medical history includes immunotherapy and radiation treatments.  Medications include Medrol Dosepak, and Compazine.  Labs include sodium 130, albumin 2.4 on December 14.  Height: 5 feet 11 inches. Weight: 165.6 pounds on December 14. Usual body weight: 160-165 pounds per patient. BMI: 23.11.  Patient reports he has been eating small amounts of food throughout the day. He reports constipation and states he has had about 2 bowel movements over the past several weeks. He was educated to take MiraLAX along with some other medications to improve constipation. Dietary recall reveals patient is consuming cereal with milk, banana, eggs, yogurt, and peanut butter and jelly sandwiches. Patient inquiring whether he should take a multivitamin.  Nutrition diagnosis:  Food and nutrition related knowledge deficit related to lung cancer and associated treatments as evidenced by no prior need for nutrition related information.  Intervention:  Patient educated to consume small frequent meals and snacks utilizing high-calorie high-protein foods. Reviewed appropriate high-calorie high-protein snacks and provided fact sheets. Educated patient on strategies for improving constipation, and provided a fact sheet Reviewed importance of increased hydration and reviewed fluids and fruits and vegetables that have lots of water in them. Educated patient about the availability of oral nutrition supplements to assist patient to increase calories and protein. Educated patient about multivitamins; patient has decided not to take anything for now.  Monitoring, evaluation, goals: Patient will tolerate increased calories and protein to minimize nutrition impact symptoms and maintain lean body mass.  Next visit: Wednesday, December 28, during infusion.  **Disclaimer: This note was dictated with voice  recognition software. Similar sounding words can inadvertently be transcribed and this note may contain transcription errors which may not have been corrected upon publication of note.**

## 2015-05-26 NOTE — Patient Instructions (Signed)
[place] Discharge Instructions for Patients Receiving Chemotherapy  Today you received the following chemotherapy agents: Cisplatin, Portrazza, Gemzar. To help prevent nausea and vomiting after your treatment, we encourage you to take your nausea medication: Compazine 10 mg every 6 hours.   If you develop nausea and vomiting that is not controlled by your nausea medication, call the clinic.   BELOW ARE SYMPTOMS THAT SHOULD BE REPORTED IMMEDIATELY:  *FEVER GREATER THAN 100.5 F  *CHILLS WITH OR WITHOUT FEVER  NAUSEA AND VOMITING THAT IS NOT CONTROLLED WITH YOUR NAUSEA MEDICATION  *UNUSUAL SHORTNESS OF BREATH  *UNUSUAL BRUISING OR BLEEDING  TENDERNESS IN MOUTH AND THROAT WITH OR WITHOUT PRESENCE OF ULCERS  *URINARY PROBLEMS  *BOWEL PROBLEMS  UNUSUAL RASH Items with * indicate a potential emergency and should be followed up as soon as possible.  Feel free to call the clinic you have any questions or concerns. The clinic phone number is [phone].  Please show the [place] at check-in to the Emergency Department and triage nurse.

## 2015-05-28 ENCOUNTER — Telehealth: Payer: Self-pay | Admitting: Medical Oncology

## 2015-05-28 NOTE — Telephone Encounter (Signed)
I left message for pt to call back and let a nurse know how he did with chemo ,monoclonal .

## 2015-06-02 ENCOUNTER — Encounter: Payer: Self-pay | Admitting: Internal Medicine

## 2015-06-02 ENCOUNTER — Ambulatory Visit (HOSPITAL_BASED_OUTPATIENT_CLINIC_OR_DEPARTMENT_OTHER): Payer: BLUE CROSS/BLUE SHIELD

## 2015-06-02 ENCOUNTER — Other Ambulatory Visit (HOSPITAL_BASED_OUTPATIENT_CLINIC_OR_DEPARTMENT_OTHER): Payer: BLUE CROSS/BLUE SHIELD

## 2015-06-02 ENCOUNTER — Ambulatory Visit: Payer: BLUE CROSS/BLUE SHIELD | Admitting: Nutrition

## 2015-06-02 ENCOUNTER — Ambulatory Visit (HOSPITAL_BASED_OUTPATIENT_CLINIC_OR_DEPARTMENT_OTHER): Payer: BLUE CROSS/BLUE SHIELD | Admitting: Internal Medicine

## 2015-06-02 VITALS — BP 121/79 | HR 128 | Temp 98.1°F | Resp 20 | Ht 71.0 in | Wt 147.3 lb

## 2015-06-02 DIAGNOSIS — C7951 Secondary malignant neoplasm of bone: Secondary | ICD-10-CM

## 2015-06-02 DIAGNOSIS — C3411 Malignant neoplasm of upper lobe, right bronchus or lung: Secondary | ICD-10-CM

## 2015-06-02 DIAGNOSIS — C787 Secondary malignant neoplasm of liver and intrahepatic bile duct: Secondary | ICD-10-CM

## 2015-06-02 DIAGNOSIS — Z5112 Encounter for antineoplastic immunotherapy: Secondary | ICD-10-CM | POA: Diagnosis not present

## 2015-06-02 DIAGNOSIS — C3491 Malignant neoplasm of unspecified part of right bronchus or lung: Secondary | ICD-10-CM

## 2015-06-02 LAB — CBC WITH DIFFERENTIAL/PLATELET
BASO%: 0.1 % (ref 0.0–2.0)
Basophils Absolute: 0 10*3/uL (ref 0.0–0.1)
EOS ABS: 0 10*3/uL (ref 0.0–0.5)
EOS%: 0.1 % (ref 0.0–7.0)
HCT: 32.2 % — ABNORMAL LOW (ref 38.4–49.9)
HEMOGLOBIN: 10.6 g/dL — AB (ref 13.0–17.1)
LYMPH#: 0.3 10*3/uL — AB (ref 0.9–3.3)
LYMPH%: 3.5 % — ABNORMAL LOW (ref 14.0–49.0)
MCH: 27.6 pg (ref 27.2–33.4)
MCHC: 32.9 g/dL (ref 32.0–36.0)
MCV: 83.9 fL (ref 79.3–98.0)
MONO#: 0.4 10*3/uL (ref 0.1–0.9)
MONO%: 4.6 % (ref 0.0–14.0)
NEUT%: 91.7 % — ABNORMAL HIGH (ref 39.0–75.0)
NEUTROS ABS: 7.7 10*3/uL — AB (ref 1.5–6.5)
Platelets: 202 10*3/uL (ref 140–400)
RBC: 3.84 10*6/uL — ABNORMAL LOW (ref 4.20–5.82)
RDW: 14.5 % (ref 11.0–14.6)
WBC: 8.3 10*3/uL (ref 4.0–10.3)

## 2015-06-02 LAB — COMPREHENSIVE METABOLIC PANEL
ALBUMIN: 2.3 g/dL — AB (ref 3.5–5.0)
ALK PHOS: 161 U/L — AB (ref 40–150)
ALT: 18 U/L (ref 0–55)
AST: 25 U/L (ref 5–34)
Anion Gap: 12 mEq/L — ABNORMAL HIGH (ref 3–11)
BILIRUBIN TOTAL: 0.89 mg/dL (ref 0.20–1.20)
BUN: 10.9 mg/dL (ref 7.0–26.0)
CO2: 25 mEq/L (ref 22–29)
CREATININE: 0.6 mg/dL — AB (ref 0.7–1.3)
Calcium: 9.2 mg/dL (ref 8.4–10.4)
Chloride: 90 mEq/L — ABNORMAL LOW (ref 98–109)
EGFR: >90 mL/min/{1.73_m2} (ref >90)
GLUCOSE: 106 mg/dL (ref 70–140)
Potassium: 4.8 mEq/L (ref 3.5–5.1)
SODIUM: 127 meq/L — AB (ref 136–145)
TOTAL PROTEIN: 7.1 g/dL (ref 6.4–8.3)

## 2015-06-02 LAB — MAGNESIUM: Magnesium: 2 mg/dl (ref 1.5–2.5)

## 2015-06-02 MED ORDER — SODIUM CHLORIDE 0.9 % IJ SOLN
10.0000 mL | INTRAMUSCULAR | Status: DC | PRN
  Administered 2015-06-02: 10 mL
  Filled 2015-06-02: qty 10

## 2015-06-02 MED ORDER — SODIUM CHLORIDE 0.9 % IV SOLN
800.0000 mg | Freq: Once | INTRAVENOUS | Status: AC
  Administered 2015-06-02: 800 mg via INTRAVENOUS
  Filled 2015-06-02: qty 50

## 2015-06-02 MED ORDER — SODIUM CHLORIDE 0.9 % IV SOLN
1000.0000 mg/m2 | Freq: Once | INTRAVENOUS | Status: AC
  Administered 2015-06-02: 1938 mg via INTRAVENOUS
  Filled 2015-06-02: qty 50.97

## 2015-06-02 MED ORDER — PROCHLORPERAZINE MALEATE 10 MG PO TABS
ORAL_TABLET | ORAL | Status: AC
  Filled 2015-06-02: qty 1

## 2015-06-02 MED ORDER — HEPARIN SOD (PORK) LOCK FLUSH 100 UNIT/ML IV SOLN
500.0000 [IU] | Freq: Once | INTRAVENOUS | Status: AC | PRN
  Administered 2015-06-02: 500 [IU]
  Filled 2015-06-02: qty 5

## 2015-06-02 MED ORDER — SODIUM CHLORIDE 0.9 % IV SOLN
Freq: Once | INTRAVENOUS | Status: AC
  Administered 2015-06-02: 10:00:00 via INTRAVENOUS

## 2015-06-02 MED ORDER — PROCHLORPERAZINE MALEATE 10 MG PO TABS
10.0000 mg | ORAL_TABLET | Freq: Once | ORAL | Status: AC
  Administered 2015-06-02: 10 mg via ORAL

## 2015-06-02 NOTE — Progress Notes (Signed)
Nutrition follow-up completed with patient during infusion. Patient reports he has had very poor appetite with very little intake. Weight has decreased significantly was documented as 147.3 pounds December 28 decreased from 165.6 pounds December 14. Patient has not been using oral nutrition supplements. He states constipation continues but he is taking 3 different medications to improve constipation. Severe malnutrition in the context of chronic illness secondary to 11% weight loss in 2 weeks, less than 75% of energy intake for greater than one month and depletion of both body fat and muscle mass.  Nutrition diagnosis:  Food and nutrition related knowledge deficit continues.  Intervention:  Educated patient on the importance of increasing oral intake using bites and sips every 15-30 minutes until appetite improves. Recommended patient add vanilla boost +3 times a day to 4 times a day between meals for additional calories and protein. Provided patient with coupons for boost plus. Encouraged patient to continue adequate hydration. Teach back method was used.  Monitoring, evaluation, goals: Patient will tolerate increased calories and protein to minimize weight loss.  Next visit: Wednesday, January 18, during infusion.  **Disclaimer: This note was dictated with voice recognition software. Similar sounding words can inadvertently be transcribed and this note may contain transcription errors which may not have been corrected upon publication of note.**

## 2015-06-02 NOTE — Progress Notes (Signed)
[place] [phone]   Fax:(336) [phone]  OFFICE PROGRESS NOTE  Bill Huh, MD 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  PRIOR THERAPY:  1) Palliative radiotherapy to the large right upper lobe lung mass under the care of Dr. Tammi Klippel. 2) treatment on BMS 568 (nivolumab/ipilumumab) at [facility], discontinued recently secondary to disease progression.   CURRENT THERAPY: Systemic chemotherapy with cisplatin 75 MG/M2 on day 1, Gemcitabine 1000 MG/M2 on days 1, 8 and  Portrazza 800 mg on days 1 and 8 every 3 weeks. First dose on 05/26/2015.  INTERVAL HISTORY: [place] 54 y.o. male returns to the clinic today for follow-up visit. The patient was started last week on the first dose of systemic chemotherapy with cisplatin, gemcitabine and Portrazza. He tolerated the first week of his treatment fairly well except for fatigue. He continues to have cough and currently on Tussionex. The patient also has low back pain and he is currently taking ibuprofen and is not interested in taking any narcotic medication at this point. He has no fever or chills. The patient denied having any significant nausea or vomiting. The patient also has lack of appetite and recent weight loss. He has no significant shortness of breath but has dry cough with no hemoptyses. He is here today to start day 8 of the first cycle of his treatment.  MEDICAL HISTORY: Past Medical History  Diagnosis Date  . Shortness of breath dyspnea   . 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 Radiation 02/26/2015    ALLERGIES:  is allergic to  penicillins.  MEDICATIONS:  Current Outpatient Prescriptions  Medication Sig Dispense Refill  . acetaminophen (TYLENOL) 325 MG tablet Take by mouth.    . chlorpheniramine-HYDROcodone (TUSSIONEX) 10-8 MG/5ML SUER take 5 milliliters by mouth every 12 hours if needed for cough for UP TO 10 days  0  . doxycycline (VIBRA-TABS) 100 MG tablet Take 1 tablet (100 mg total) by mouth 2 (two) times daily. Start when having skin rash. 20 tablet 0  . lidocaine-prilocaine (EMLA) cream Apply 1 application topically as needed. 30 g 0  . nicotine polacrilex (NICORETTE) 4 MG gum Take 4 mg by mouth as needed for smoking cessation.    . ondansetron (ZOFRAN) 8 MG tablet Take 1 tablet (8 mg total) by mouth every 8 (eight) hours as needed for nausea or vomiting. 20 tablet 0  . prochlorperazine (COMPAZINE) 10 MG tablet Take 1 tablet (10 mg total) by mouth every 6 (six) hours as needed for nausea or vomiting. 30 tablet 0  . tetrahydrozoline 0.05 % ophthalmic solution Apply to eye.    . Wound Cleansers (RADIAPLEX EX) Apply topically.    Marland Kitchen ibuprofen (ADVIL,MOTRIN) 600 MG tablet Take by mouth. Reported on 06/02/2015     No current facility-administered medications for this visit.    SURGICAL HISTORY:  Past Surgical History  Procedure Laterality Date  . Hernia repair    . Cyst removal neck      REVIEW OF SYSTEMS:  Constitutional: positive for anorexia, fatigue and weight loss Eyes: negative Ears, nose, mouth, throat, and face: positive for swelling of face and neck Respiratory:  positive for cough Cardiovascular: negative Gastrointestinal: negative Genitourinary:negative Integument/breast: negative Hematologic/lymphatic: negative Musculoskeletal:positive for bone pain Neurological: negative Behavioral/Psych: negative Endocrine: negative Allergic/Immunologic: negative   PHYSICAL EXAMINATION: General appearance: alert, cooperative and no distress Head: Normocephalic, without obvious abnormality,  atraumatic Neck: no adenopathy, no JVD, supple, symmetrical, trachea midline, thyroid not enlarged, symmetric, no tenderness/mass/nodules and swelling of the neck. Lymph nodes: Cervical, supraclavicular, and axillary nodes normal. Resp: diminished breath sounds RLL and dullness to percussion RLL Back: symmetric, no curvature. ROM normal. No CVA tenderness. Cardio: regular rate and rhythm, S1, S2 normal, no murmur, click, rub or gallop GI: soft, non-tender; bowel sounds normal; no masses,  no organomegaly Extremities: edema upper extremities. Neurologic: Alert and oriented X 3, normal strength and tone. Normal symmetric reflexes. Normal coordination and gait  ECOG PERFORMANCE STATUS: 1 - Symptomatic but completely ambulatory  Blood pressure 121/79, pulse 128, temperature 98.1 F (36.7 C), temperature source Oral, resp. rate 20, height '5\' 11"'$  (1.803 m), weight 147 lb 4.8 oz (66.815 kg), SpO2 100 %.  LABORATORY DATA: Lab Results  Component Value Date   WBC 8.3 06/02/2015   HGB 10.6* 06/02/2015   HCT 32.2* 06/02/2015   MCV 83.9 06/02/2015   PLT 202 06/02/2015      Chemistry      Component Value Date/Time   NA 126* 05/26/2015 0854   NA 137 01/13/2012 1052   K 5.0 05/26/2015 0854   K 4.7 01/13/2012 1052   CL 103 01/13/2012 1052   CO2 22 05/26/2015 0854   CO2 30 01/13/2012 1052   BUN 12.2 05/26/2015 0854   BUN 14 01/13/2012 1052   CREATININE 0.7 05/26/2015 0854   CREATININE 0.87 01/13/2012 1052      Component Value Date/Time   CALCIUM 9.1 05/26/2015 0854   CALCIUM 9.4 01/13/2012 1052   ALKPHOS 141 05/26/2015 0854   ALKPHOS 48 01/13/2012 1052   AST 30 05/26/2015 0854   AST 14 01/13/2012 1052   ALT 22 05/26/2015 0854   ALT 9 01/13/2012 1052   BILITOT 0.79 05/26/2015 0854   BILITOT 0.4 01/13/2012 1052       RADIOGRAPHIC STUDIES:  ASSESSMENT AND PLAN: This is a very pleasant 54 years old white male recently diagnosed with stage IV non-small cell lung cancer, poorly  differentiated squamous cell carcinoma with negative PDL 1 expression, presented with large suprahilar right upper lobe mass in addition to mediastinal lymphadenopathy as well as extensive liver and bone disease involving the spines, hips, ribs, skull and subcutaneous nodule in the left chest wall. The patient also has a suspicious 3 mm lesion in the brain. He was treated on Clinical trial with first line immunotherapy with Ipilumumab and Nivolumab at [facility] but unfortunately has evidence for disease progression after 2 cycles. He is currently undergoing systemic chemotherapy with cisplatin, gemcitabine and Portrazza status post day 1 of the first cycle. He is tolerating his treatment well. I recommended for the patient to proceed with day 8 of the first cycle as a scheduled today. For the bone disease, I requested the patient to have a dental clearance before starting him on treatment with Xgeva. He was also advised to take calcium supplements on daily basis. The patient would come back for follow-up visit in 2 weeks for reevaluation and management of any adverse effect of his treatment before starting cycle #2. For the lack of appetite, I referred him to the dietation at the [facility]  I will also referred the patient to interventional  radiology for evaluation and and consideration of stent placement for the SVC occlusion. He was advised to call immediately if he has any concerning symptoms in the interval. I gave the patient and his family the time to ask questions and I answered them completely to their satisfaction. The patient voices understanding of current disease status and treatment options and is in agreement with the current care plan.  All questions were answered. The patient knows to call the clinic with any problems, questions or concerns. We can certainly see the patient much sooner if necessary.  Disclaimer: This note was dictated with voice recognition software.  Similar sounding words can inadvertently be transcribed and may not be corrected upon review.

## 2015-06-03 ENCOUNTER — Other Ambulatory Visit: Payer: Self-pay | Admitting: Nurse Practitioner

## 2015-06-07 ENCOUNTER — Emergency Department (HOSPITAL_COMMUNITY): Payer: BLUE CROSS/BLUE SHIELD

## 2015-06-07 ENCOUNTER — Encounter (HOSPITAL_COMMUNITY): Payer: Self-pay | Admitting: Emergency Medicine

## 2015-06-07 ENCOUNTER — Inpatient Hospital Stay (HOSPITAL_COMMUNITY)
Admission: EM | Admit: 2015-06-07 | Discharge: 2015-06-22 | DRG: 871 | Disposition: A | Discharge status: Hospice | Payer: BLUE CROSS/BLUE SHIELD | Attending: Internal Medicine | Admitting: Internal Medicine

## 2015-06-07 DIAGNOSIS — E871 Hypo-osmolality and hyponatremia: Secondary | ICD-10-CM | POA: Diagnosis present

## 2015-06-07 DIAGNOSIS — Z9889 Other specified postprocedural states: Secondary | ICD-10-CM | POA: Diagnosis present

## 2015-06-07 DIAGNOSIS — J939 Pneumothorax, unspecified: Secondary | ICD-10-CM

## 2015-06-07 DIAGNOSIS — C3411 Malignant neoplasm of upper lobe, right bronchus or lung: Secondary | ICD-10-CM | POA: Diagnosis present

## 2015-06-07 DIAGNOSIS — R0600 Dyspnea, unspecified: Secondary | ICD-10-CM | POA: Diagnosis not present

## 2015-06-07 DIAGNOSIS — J441 Chronic obstructive pulmonary disease with (acute) exacerbation: Secondary | ICD-10-CM | POA: Diagnosis not present

## 2015-06-07 DIAGNOSIS — R609 Edema, unspecified: Secondary | ICD-10-CM | POA: Diagnosis not present

## 2015-06-07 DIAGNOSIS — R0609 Other forms of dyspnea: Secondary | ICD-10-CM | POA: Diagnosis not present

## 2015-06-07 DIAGNOSIS — E222 Syndrome of inappropriate secretion of antidiuretic hormone: Secondary | ICD-10-CM | POA: Diagnosis present

## 2015-06-07 DIAGNOSIS — R079 Chest pain, unspecified: Secondary | ICD-10-CM | POA: Diagnosis not present

## 2015-06-07 DIAGNOSIS — D61810 Antineoplastic chemotherapy induced pancytopenia: Secondary | ICD-10-CM | POA: Diagnosis present

## 2015-06-07 DIAGNOSIS — D72819 Decreased white blood cell count, unspecified: Secondary | ICD-10-CM | POA: Diagnosis present

## 2015-06-07 DIAGNOSIS — J9600 Acute respiratory failure, unspecified whether with hypoxia or hypercapnia: Secondary | ICD-10-CM | POA: Diagnosis not present

## 2015-06-07 DIAGNOSIS — J690 Pneumonitis due to inhalation of food and vomit: Secondary | ICD-10-CM | POA: Diagnosis present

## 2015-06-07 DIAGNOSIS — C7931 Secondary malignant neoplasm of brain: Secondary | ICD-10-CM | POA: Diagnosis present

## 2015-06-07 DIAGNOSIS — J90 Pleural effusion, not elsewhere classified: Secondary | ICD-10-CM | POA: Diagnosis present

## 2015-06-07 DIAGNOSIS — A419 Sepsis, unspecified organism: Secondary | ICD-10-CM | POA: Diagnosis present

## 2015-06-07 DIAGNOSIS — E872 Acidosis: Secondary | ICD-10-CM | POA: Diagnosis present

## 2015-06-07 DIAGNOSIS — Z66 Do not resuscitate: Secondary | ICD-10-CM | POA: Diagnosis present

## 2015-06-07 DIAGNOSIS — Z6820 Body mass index (BMI) 20.0-20.9, adult: Secondary | ICD-10-CM | POA: Diagnosis not present

## 2015-06-07 DIAGNOSIS — J948 Other specified pleural conditions: Secondary | ICD-10-CM | POA: Diagnosis not present

## 2015-06-07 DIAGNOSIS — J9601 Acute respiratory failure with hypoxia: Secondary | ICD-10-CM | POA: Diagnosis not present

## 2015-06-07 DIAGNOSIS — R0602 Shortness of breath: Secondary | ICD-10-CM

## 2015-06-07 DIAGNOSIS — D72829 Elevated white blood cell count, unspecified: Secondary | ICD-10-CM | POA: Diagnosis not present

## 2015-06-07 DIAGNOSIS — C3490 Malignant neoplasm of unspecified part of unspecified bronchus or lung: Secondary | ICD-10-CM | POA: Diagnosis not present

## 2015-06-07 DIAGNOSIS — D638 Anemia in other chronic diseases classified elsewhere: Secondary | ICD-10-CM | POA: Diagnosis not present

## 2015-06-07 DIAGNOSIS — D630 Anemia in neoplastic disease: Secondary | ICD-10-CM | POA: Diagnosis present

## 2015-06-07 DIAGNOSIS — C801 Malignant (primary) neoplasm, unspecified: Secondary | ICD-10-CM

## 2015-06-07 DIAGNOSIS — K5900 Constipation, unspecified: Secondary | ICD-10-CM | POA: Diagnosis present

## 2015-06-07 DIAGNOSIS — Z87891 Personal history of nicotine dependence: Secondary | ICD-10-CM

## 2015-06-07 DIAGNOSIS — L8990 Pressure ulcer of unspecified site, unspecified stage: Secondary | ICD-10-CM | POA: Insufficient documentation

## 2015-06-07 DIAGNOSIS — J189 Pneumonia, unspecified organism: Secondary | ICD-10-CM | POA: Diagnosis present

## 2015-06-07 DIAGNOSIS — Z515 Encounter for palliative care: Secondary | ICD-10-CM | POA: Diagnosis present

## 2015-06-07 DIAGNOSIS — J9621 Acute and chronic respiratory failure with hypoxia: Secondary | ICD-10-CM | POA: Diagnosis present

## 2015-06-07 DIAGNOSIS — C787 Secondary malignant neoplasm of liver and intrahepatic bile duct: Secondary | ICD-10-CM | POA: Diagnosis present

## 2015-06-07 DIAGNOSIS — Y95 Nosocomial condition: Secondary | ICD-10-CM | POA: Diagnosis present

## 2015-06-07 DIAGNOSIS — T451X5A Adverse effect of antineoplastic and immunosuppressive drugs, initial encounter: Secondary | ICD-10-CM | POA: Diagnosis present

## 2015-06-07 DIAGNOSIS — J181 Lobar pneumonia, unspecified organism: Secondary | ICD-10-CM | POA: Diagnosis present

## 2015-06-07 DIAGNOSIS — B957 Other staphylococcus as the cause of diseases classified elsewhere: Secondary | ICD-10-CM | POA: Diagnosis present

## 2015-06-07 DIAGNOSIS — I871 Compression of vein: Secondary | ICD-10-CM | POA: Diagnosis present

## 2015-06-07 DIAGNOSIS — R600 Localized edema: Secondary | ICD-10-CM | POA: Diagnosis present

## 2015-06-07 DIAGNOSIS — E876 Hypokalemia: Secondary | ICD-10-CM | POA: Diagnosis not present

## 2015-06-07 DIAGNOSIS — E43 Unspecified severe protein-calorie malnutrition: Secondary | ICD-10-CM | POA: Diagnosis present

## 2015-06-07 DIAGNOSIS — C7951 Secondary malignant neoplasm of bone: Secondary | ICD-10-CM | POA: Diagnosis present

## 2015-06-07 DIAGNOSIS — Z923 Personal history of irradiation: Secondary | ICD-10-CM | POA: Diagnosis not present

## 2015-06-07 DIAGNOSIS — Z789 Other specified health status: Secondary | ICD-10-CM | POA: Diagnosis not present

## 2015-06-07 LAB — CBC WITH DIFFERENTIAL/PLATELET
BASOS ABS: 0 10*3/uL (ref 0.0–0.1)
Basophils Relative: 0 %
Eosinophils Absolute: 0 10*3/uL (ref 0.0–0.7)
Eosinophils Relative: 0 %
HEMATOCRIT: 28.2 % — AB (ref 39.0–52.0)
HEMOGLOBIN: 9.4 g/dL — AB (ref 13.0–17.0)
LYMPHS ABS: 0.1 10*3/uL — AB (ref 0.7–4.0)
Lymphocytes Relative: 4 %
MCH: 27.9 pg (ref 26.0–34.0)
MCHC: 33.3 g/dL (ref 30.0–36.0)
MCV: 83.7 fL (ref 78.0–100.0)
MONOS PCT: 8 %
Monocytes Absolute: 0.3 10*3/uL (ref 0.1–1.0)
NEUTROS ABS: 3.2 10*3/uL (ref 1.7–7.7)
Neutrophils Relative %: 88 %
Platelets: 84 10*3/uL — ABNORMAL LOW (ref 150–400)
RBC: 3.37 MIL/uL — ABNORMAL LOW (ref 4.22–5.81)
RDW: 14.6 % (ref 11.5–15.5)
WBC Morphology: INCREASED
WBC: 3.6 10*3/uL — ABNORMAL LOW (ref 4.0–10.5)

## 2015-06-07 LAB — BLOOD GAS, ARTERIAL
Acid-Base Excess: 1.3 mmol/L (ref 0.0–2.0)
Bicarbonate: 23.8 mEq/L (ref 20.0–24.0)
Drawn by: 422461
FIO2: 1
O2 CONTENT: 15 L/min
O2 SAT: 91.5 %
PCO2 ART: 33.9 mmHg — AB (ref 35.0–45.0)
PH ART: 7.469 — AB (ref 7.350–7.450)
PO2 ART: 69.2 mmHg — AB (ref 80.0–100.0)
Patient temperature: 101.9
TCO2: 22 mmol/L (ref 0–100)

## 2015-06-07 LAB — COMPREHENSIVE METABOLIC PANEL
ALBUMIN: 2 g/dL — AB (ref 3.5–5.0)
ALK PHOS: 146 U/L — AB (ref 38–126)
ALT: 28 U/L (ref 17–63)
AST: 45 U/L — AB (ref 15–41)
Anion gap: 10 (ref 5–15)
BILIRUBIN TOTAL: 1.2 mg/dL (ref 0.3–1.2)
BUN: 16 mg/dL (ref 6–20)
CO2: 26 mmol/L (ref 22–32)
CREATININE: 0.49 mg/dL — AB (ref 0.61–1.24)
Calcium: 8.3 mg/dL — ABNORMAL LOW (ref 8.9–10.3)
Chloride: 89 mmol/L — ABNORMAL LOW (ref 101–111)
GFR calc Af Amer: >60 mL/min (ref >60)
GFR calc non Af Amer: >60 mL/min (ref >60)
GLUCOSE: 146 mg/dL — AB (ref 65–99)
POTASSIUM: 4.1 mmol/L (ref 3.5–5.1)
Sodium: 125 mmol/L — ABNORMAL LOW (ref 135–145)
TOTAL PROTEIN: 5.8 g/dL — AB (ref 6.5–8.1)

## 2015-06-07 LAB — I-STAT CG4 LACTIC ACID, ED: Lactic Acid, Venous: 2.39 mmol/L (ref 0.5–2.0)

## 2015-06-07 MED ORDER — VANCOMYCIN HCL 10 G IV SOLR
1500.0000 mg | INTRAVENOUS | Status: AC
  Administered 2015-06-07: 1500 mg via INTRAVENOUS
  Filled 2015-06-07: qty 1500

## 2015-06-07 MED ORDER — SODIUM CHLORIDE 0.9 % IV BOLUS (SEPSIS)
1000.0000 mL | Freq: Once | INTRAVENOUS | Status: AC
  Administered 2015-06-07: 1000 mL via INTRAVENOUS

## 2015-06-07 MED ORDER — DEXTROSE 5 % IV SOLN: 1.0000 g | Freq: Once | INTRAVENOUS | Status: DC

## 2015-06-07 MED ORDER — DEXTROSE 5 % IV SOLN
1.0000 g | Freq: Once | INTRAVENOUS | Status: DC
  Filled 2015-06-07: qty 1

## 2015-06-07 NOTE — ED Notes (Signed)
MD aware of need for antibiotics for code sepsis.  He is waiting for results of CXR to order.

## 2015-06-07 NOTE — ED Notes (Signed)
Pt stated unable to give urine sample at this time. Urinal at bedside.

## 2015-06-07 NOTE — ED Notes (Signed)
Pt stated still unable to give urine sample. Urinal at bedside.

## 2015-06-07 NOTE — ED Notes (Signed)
Lactic Acid= 2.39, MD Audie Pinto notified

## 2015-06-07 NOTE — ED Notes (Signed)
Pt began having ShOB today that progressively worsened.

## 2015-06-07 NOTE — Progress Notes (Signed)
ANTIBIOTIC CONSULT NOTE - INITIAL  Pharmacy Consult for Vancomycin Indication: Sepsis  Allergies  Allergen Reactions  . Penicillins Other (See Comments)    Childhood Has patient had a PCN reaction causing immediate rash, facial/tongue/throat swelling, SOB or lightheadedness with hypotension: No Has patient had a PCN reaction causing severe rash involving mucus membranes or skin necrosis: No Has patient had a PCN reaction that required hospitalization No Has patient had a PCN reaction occurring within the last 10 years: No If all of the above answers are "NO", then may proceed with Cephalospor    Patient Measurements: Height: '5\' 11"'$  (180.3 cm) Weight: 147 lb (66.679 kg) IBW/kg (Calculated) : 75.3 Adjusted Body Weight:   Vital Signs: Temp: 101.9 F (38.8 C) (01/02 2013) Temp Source: Rectal (01/02 2013) BP: 115/61 mmHg (01/02 2124) Pulse Rate: 128 (01/02 2124) Intake/Output from previous day:   Intake/Output from this shift:    Labs: No results for input(s): WBC, HGB, PLT, LABCREA, CREATININE in the last 72 hours. Estimated Creatinine Clearance: 99.6 mL/min (by C-G formula based on Cr of 0.6). No results for input(s): VANCOTROUGH, VANCOPEAK, VANCORANDOM, GENTTROUGH, GENTPEAK, GENTRANDOM, TOBRATROUGH, TOBRAPEAK, TOBRARND, AMIKACINPEAK, AMIKACINTROU, AMIKACIN in the last 72 hours.   Microbiology: No results found for this or any previous visit (from the past 720 hour(s)).  Medical History: Past Medical History  Diagnosis Date  . Shortness of breath dyspnea   . 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 Radiation 02/26/2015   Assessment: 64 yoM with PMHx stage IV NSCLC undergoing chemotherapy presents with worsening SOB and fever.  CXR shows patchy opacity at left lung base suspicious for PNA vs new metastatic disease.  Lactate elevated with tachycardia.  Pharmacy consulted to start Vancomycin for sepsis  Anti-infectives 1/2 >> Vancomycin   >>  Vitals/Labs WBC / ANC: In process Tm24h: 101.9 Lactate 2.39 Renal: SCr in process  Cultures 1/2 bloodx2: ordered 1/2 urine: ordered   Goal of Therapy:  Vancomycin trough level 15-20 mcg/ml  Eradication of infection  Plan:  Vancomycin '1500mg'$  IV x1 F/u BMET to assess renal function for maintenance doses of vancomycin F/u orders for GNR coverage   Ralene Bathe, PharmD, BCPS 06/07/2015, 9:42 PM  Pager: [phone]

## 2015-06-07 NOTE — H&P (Signed)
[facility] History and Physical  Bill Valdez YTK:[id number] DOB: 01-Jun-1961 DOA: 06/07/2015  Referring physician: Dr Audie Pinto PCP: [name], MD   Chief Complaint: Cough, SOB and fever  HPI: Bill Valdez is a 55 y.o. male w no sig PMH who presented in Sept '16 with wt loss, SOB and chest pain. Dx'd with stage 4 lung cancer and is currently getting chemoRx w oncology here at [facility].  Has liver mets , SVC syndrome, numerous bony mets.  Presenting today w SOB of one day's duration. CXR here shows new RLL consolidation/ effusion and patchy LLL infiltrate.  Temp is 102 deg, WBC 3.  Asked to see for admission for PNA.    Other issues are chronic cough rx with tussionex, constipation rx with bid Senna and prn miralax, nausea Rx w zofran.  Bone pain rx with tid prn ibuprofen.  Sister and wife are here with him today.     In Sept '16 pt presented w 2 mo hx of SOB, wt loss and left chest pain. CXR showed RUL suprahilar mass. CT showed 7 cm RUL mass invading mediastinum invading the SVC, prob tumor thrombus in R IJ vein and L BC vein, also in azygous. Also liver mets. Smoked 2 ppd for 30 yrs, quit 2012.  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 Biopsy done and was dx's with Stage 4 (T3, N2, M1 B) non-small cell lung cancer. Was treated w immunotherapy at [facility] for two rounds but progressed so now he is being treated by Dr Julien Nordmann with cisplatin/gemcitabine and Doree Barthel. Initial studies didn't show bone mets but subsequent studies showed numerous bony mets. Has had pall XRT to lung mass, also to a hip lesion and has had some cranial XRT.    ROS  good BM yest  no n/v  no pain right now  no HA  no blurred vision  Where does patient live home Can patient participate in ADLs? yes  Past Medical History  Past Medical History  Diagnosis Date  . Shortness of breath dyspnea   . 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 Radiation 02/26/2015   Past Surgical History  Past  Surgical History  Procedure Laterality Date  . Hernia repair    . Cyst removal neck     Family History  Family History  Problem Relation Age of Onset  . Cancer Father     liver/kidney    Social History  reports that he quit smoking about 4 years ago. His smoking use included Cigarettes. He has a 45 pack-year smoking history. He has quit using smokeless tobacco. He reports that he does not drink alcohol or use illicit drugs. Allergies  Allergies  Allergen Reactions  . Penicillins Other (See Comments)    Childhood Has patient had a PCN reaction causing immediate rash, facial/tongue/throat swelling, SOB or lightheadedness with hypotension: No. Has patient had a PCN reaction causing severe rash involving mucus membranes or skin necrosis: No Has patient had a PCN reaction that required hospitalization No Has patient had a PCN reaction occurring within the last 10 years: No If all of the above answers are "NO", then may proceed with Cephalospor   Home medications Prior to Admission medications   Medication Sig Start Date End Date Taking? Authorizing Provider  chlorpheniramine-HYDROcodone (TUSSIONEX) 10-8 MG/5ML SUER Take 5 milliliters by mouth every 12 hours if needed for cough for 05/14/15  Yes Historical Provider, MD  ibuprofen (ADVIL,MOTRIN) 600 MG tablet Take 600  mg by mouth every 6 (six) hours as needed for moderate pain. Reported on 06/02/2015   Yes Historical Provider, MD  polyethylene glycol (MIRALAX / GLYCOLAX) packet Take 17 g by mouth daily as needed for mild constipation.   Yes Historical Provider, MD  prochlorperazine (COMPAZINE) 10 MG tablet Take 1 tablet (10 mg total) by mouth every 6 (six) hours as needed for nausea or vomiting. 02/25/15  Yes Curt Bears, MD  sennosides-docusate sodium (SENOKOT-S) 8.6-50 MG tablet Take 1 tablet by mouth 2 (two) times daily.   Yes Historical Provider, MD  doxycycline (VIBRA-TABS) 100 MG tablet Take 1 tablet (100 mg total) by mouth 2 (two)  times daily. Start when having skin rash. 05/19/15   Curt Bears, MD  lidocaine-prilocaine (EMLA) cream Apply 1 application topically as needed. 02/25/15   Curt Bears, MD  ondansetron (ZOFRAN) 8 MG tablet Take 1 tablet (8 mg total) by mouth every 8 (eight) hours as needed for nausea or vomiting. 05/26/15   Curt Bears, MD   Liver Function Tests  Recent Labs Lab 06/02/15 0812 06/07/15 2030  AST 25 45*  ALT 18 28  ALKPHOS 161* 146*  BILITOT 0.89 1.2  PROT 7.1 5.8*  ALBUMIN 2.3* 2.0*   No results for input(s): LIPASE, AMYLASE in the last 168 hours. CBC  Recent Labs Lab 06/02/15 0812 06/07/15 2030  WBC 8.3 3.6*  NEUTROABS 7.7* 3.2  HGB 10.6* 9.4*  HCT 32.2* 28.2*  MCV 83.9 83.7  PLT 202 84*   Basic Metabolic Panel  Recent Labs Lab 06/02/15 0812 06/07/15 2030  NA 127* 125*  K 4.8 4.1  CL  --  89*  CO2 25 26  GLUCOSE 106 146*  BUN 10.9 16  CREATININE 0.6* 0.49*  CALCIUM 9.2 8.3*     Filed Vitals:   06/07/15 2124 06/07/15 2130 06/07/15 2200 06/07/15 2230  BP: 115/61 113/58 116/58 121/64  Pulse: 128 124 121 116  Temp:      TempSrc:      Resp: '26 24 23 20  '$ Height:      Weight:      SpO2: 95% 95% 87% 90%   Exam: Ill appearing adult male, coughing frequently , no distress No rash, cyanosis or gangrene Sclera anicteric, throat clear No jvd Chest bronchial BS R lung 1/2 up, L side clear RRR no MRG ABd soft ntnd no mass, ascites or hsm GU normal male MS no joint changes Ext no edema, wounds, ulcers Neuro is nf , ox 3   CXR (independently reviewed) > right perihilar mass, right basilar opacity and pleural effusion. Patchy opacity at the left lung base is nonspecific, pneumonia is favored, however new metastatic disease not excluded  Home medications > doxy, compazine prn, zofran prn, advil prn, tussionex prn, senokot prn  Na 125 K 4.1  CO2 26  BUN 16  Creaet 0.49  Ca 8.3  Glu 146 Alb 2.0  LFT's ok.  Lact acid 2.39 ^  WBC 3.6 (down)  Hb 9.4   plt 84 (down)  Assessment: 1 HCAP - RLL pna w cough/ SOB and fever in lung cancer patient on active chemoRx 2 NSCCa of lung - R lung w bony and liver mets. On chemoRx carbo/ gemcitabine/ Portrazza. Had XRT to lung mass, and hip lesion. 3 Chron cough - requires tussionex bid per pt the only thing that helps  Plan - admit, IV abx, IVF"s, prn's as at home   DVT Prophylaxis lovenox  Code Status: full Family Communication: family  at bedside  Disposition Plan: home when better    Sol Blazing [facility] Pager [phone]  Cell [phone]  If 7PM-7AM, please contact night-coverage [email or web address] Password [email or web address] 06/07/2015, 11:07 PM

## 2015-06-07 NOTE — ED Notes (Signed)
Bed: NU27 Expected date:  Expected time:  Means of arrival:  Comments: No bed

## 2015-06-07 NOTE — ED Notes (Signed)
Pharmacy contacted by Rn to see if the medicine "Issisine" is available in house.  It is not in database.  Pts family member will get his medicine from home.

## 2015-06-07 NOTE — ED Provider Notes (Addendum)
CSN: [id number]     Arrival date & time 06/07/15  2008 History   First MD Initiated Contact with Patient 06/07/15 2020     Chief Complaint  Patient presents with  . Shortness of Breath      Patient is a 55 y.o. male presenting with shortness of breath.  Shortness of Breath  Patient presents with fever and shortness of breath over the last 24-48 hours.  Patient has history of lung cancer is currently undergoing chemotherapy.  He normally doesn't wear oxygen.  He is a former smoker. Past Medical History  Diagnosis Date  . Shortness of breath dyspnea   . 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 Radiation 02/26/2015   Past Surgical History  Procedure Laterality Date  . Hernia repair    . Cyst removal neck     Family History  Problem Relation Age of Onset  . Cancer Father     liver/kidney   Social History  Substance Use Topics  . Smoking status: Former Smoker -- 1.50 packs/day for 30 years    Types: Cigarettes    Quit date: 01/13/2011  . Smokeless tobacco: Former Systems developer  . Alcohol Use: No    Review of Systems  Unable to perform ROS: Acuity of condition  Respiratory: Positive for shortness of breath.       Allergies  Penicillins  Home Medications   Prior to Admission medications   Medication Sig Start Date End Date Taking? Authorizing Provider  chlorpheniramine-HYDROcodone (TUSSIONEX) 10-8 MG/5ML SUER Take 5 milliliters by mouth every 12 hours if needed for cough for 05/14/15  Yes Historical Provider, MD  ibuprofen (ADVIL,MOTRIN) 600 MG tablet Take 600 mg by mouth every 6 (six) hours as needed for moderate pain. Reported on 06/02/2015   Yes Historical Provider, MD  polyethylene glycol (MIRALAX / GLYCOLAX) packet Take 17 g by mouth daily as needed for mild constipation.   Yes Historical Provider, MD  prochlorperazine (COMPAZINE) 10 MG tablet Take 1 tablet (10 mg total) by mouth every 6 (six) hours as needed for nausea or vomiting. 02/25/15  Yes  Curt Bears, MD  sennosides-docusate sodium (SENOKOT-S) 8.6-50 MG tablet Take 1 tablet by mouth 2 (two) times daily.   Yes Historical Provider, MD  doxycycline (VIBRA-TABS) 100 MG tablet Take 1 tablet (100 mg total) by mouth 2 (two) times daily. Start when having skin rash. 05/19/15   Curt Bears, MD  lidocaine-prilocaine (EMLA) cream Apply 1 application topically as needed. 02/25/15   Curt Bears, MD  ondansetron (ZOFRAN) 8 MG tablet Take 1 tablet (8 mg total) by mouth every 8 (eight) hours as needed for nausea or vomiting. 05/26/15   Curt Bears, MD   BP 121/64 mmHg  Pulse 116  Temp(Src) 101.9 F (38.8 C) (Rectal)  Resp 20  Ht '5\' 11"'$  (1.803 m)  Wt 147 lb (66.679 kg)  BMI 20.51 kg/m2  SpO2 90% Physical Exam  Constitutional: He is oriented to person, place, and time. He appears well-developed and well-nourished. No distress.  HENT:  Head: Normocephalic and atraumatic.  Eyes: Pupils are equal, round, and reactive to light.  Neck: Normal range of motion.  Cardiovascular: Normal rate and intact distal pulses.   Pulmonary/Chest: He is in respiratory distress. He has wheezes. He has rales.  Abdominal: Normal appearance. He exhibits no distension.  Musculoskeletal: Normal range of motion.  Neurological: He is alert and oriented to person, place, and time. No cranial nerve deficit.  Skin: Skin is warm and dry. No rash noted.  Psychiatric: He has a normal mood and affect. His behavior is normal.  Nursing note and vitals reviewed.   ED Course  Procedures (including critical care time) Medications  vancomycin (VANCOCIN) 1,500 mg in sodium chloride 0.9 % 500 mL IVPB (1,500 mg Intravenous New Bag/Given 06/07/15 2205)  ceFEPIme (MAXIPIME) 1 g in dextrose 5 % 50 mL IVPB (not administered)  sodium chloride 0.9 % bolus 1,000 mL (1,000 mLs Intravenous New Bag/Given 06/07/15 2051)  sodium chloride 0.9 % bolus 1,000 mL (1,000 mLs Intravenous New Bag/Given 06/07/15 2050)      CRITICAL  CARE Performed by: Leonard Schwartz L Total critical care time: 30 minutes Critical care time was exclusive of separately billable procedures and treating other patients. Critical care was necessary to treat or prevent imminent or life-threatening deterioration. Critical care was time spent personally by me on the following activities: development of treatment plan with patient and/or surrogate as well as nursing, discussions with consultants, evaluation of patient's response to treatment, examination of patient, obtaining history from patient or surrogate, ordering and performing treatments and interventions, ordering and review of laboratory studies, ordering and review of radiographic studies, pulse oximetry and re-evaluation of patient's condition.  Labs Review Labs Reviewed  BLOOD GAS, ARTERIAL - Abnormal; Notable for the following:    pH, Arterial 7.469 (*)    pCO2 arterial 33.9 (*)    pO2, Arterial 69.2 (*)    All other components within normal limits  COMPREHENSIVE METABOLIC PANEL - Abnormal; Notable for the following:    Sodium 125 (*)    Chloride 89 (*)    Glucose, Bld 146 (*)    Creatinine, Ser 0.49 (*)    Calcium 8.3 (*)    Total Protein 5.8 (*)    Albumin 2.0 (*)    AST 45 (*)    Alkaline Phosphatase 146 (*)    All other components within normal limits  CBC WITH DIFFERENTIAL/PLATELET - Abnormal; Notable for the following:    WBC 3.6 (*)    RBC 3.37 (*)    Hemoglobin 9.4 (*)    HCT 28.2 (*)    Platelets 84 (*)    Lymphs Abs 0.1 (*)    All other components within normal limits  I-STAT CG4 LACTIC ACID, ED - Abnormal; Notable for the following:    Lactic Acid, Venous 2.39 (*)    All other components within normal limits  CULTURE, BLOOD (ROUTINE X 2)  CULTURE, BLOOD (ROUTINE X 2)  URINE CULTURE  URINALYSIS, ROUTINE W REFLEX MICROSCOPIC (NOT AT [facility])  I-STAT CG4 LACTIC ACID, ED    Imaging Review Dg Chest Portable 1 View  06/07/2015  CLINICAL DATA:  Worsening shortness  of breath today and fever. Metastatic lung cancer with metastasis to liver, brain and bone. EXAM: PORTABLE CHEST 1 VIEW COMPARISON:  Most recent imaging PET-CT 02/23/2015 FINDINGS: Tip of the left chest port in the SVC. Right suprahilar mass again seen, with surrounding irregular opacity. Volume loss in the right hemithorax with elevation of right hemidiaphragm and right pleural effusion. Diffuse multifocal opacities throughout the lower right hemithorax. There is an ill-defined patchy opacity at the left lung base. Mediastinal contours are grossly unchanged allowing for differences in technique. Callus formation noted about left lateral rib fracture. IMPRESSION: 1. Volume loss in the right hemithorax with right perihilar mass, right basilar opacity and pleural effusion. No interval exams since staging PET-CT, and findings may reflect a combination of post treatment  related change, atelectasis or collapse, or pneumonia. 2. Patchy opacity at the left lung base is nonspecific, pneumonia is favored, however new metastatic disease not excluded radiographically. Electronically Signed   By: Jeb Levering M.D.   On: 06/07/2015 21:29   I have personally reviewed and evaluated these images and lab results as part of my medical decision-making.   EKG Interpretation   Date/Time:  Monday June 07 2015 20:19:38 EST Ventricular Rate:  136 PR Interval:  145 QRS Duration: 94 QT Interval:  273 QTC Calculation: 411 R Axis:   88 Text Interpretation:  Sinus tachycardia Borderline T abnormalities,  inferior leads Baseline wander in lead(s) V5 Abnormal ekg Confirmed by  Lerry Cordrey  MD, Giovonnie Trettel ([id number]) on 06/07/2015 10:23:35 PM      MDM   Final diagnoses:  Aspiration pneumonia of left lower lobe, unspecified aspiration pneumonia type [facility])  Cancer of upper lobe of right lung [facility])        Leonard Schwartz, MD 06/07/15 [place], MD 07/03/15 [phone]

## 2015-06-08 ENCOUNTER — Other Ambulatory Visit: Payer: BLUE CROSS/BLUE SHIELD

## 2015-06-08 DIAGNOSIS — D61810 Antineoplastic chemotherapy induced pancytopenia: Secondary | ICD-10-CM

## 2015-06-08 LAB — CBC WITH DIFFERENTIAL/PLATELET
Basophils Absolute: 0 K/uL (ref 0.0–0.1)
Basophils Relative: 0 %
Eosinophils Absolute: 0 K/uL (ref 0.0–0.7)
Eosinophils Relative: 0 %
HCT: 24.5 % — ABNORMAL LOW (ref 39.0–52.0)
Hemoglobin: 8.2 g/dL — ABNORMAL LOW (ref 13.0–17.0)
Lymphocytes Relative: 8 %
Lymphs Abs: 0.3 K/uL — ABNORMAL LOW (ref 0.7–4.0)
MCH: 28 pg (ref 26.0–34.0)
MCHC: 33.5 g/dL (ref 30.0–36.0)
MCV: 83.6 fL (ref 78.0–100.0)
Monocytes Absolute: 0.3 K/uL (ref 0.1–1.0)
Monocytes Relative: 8 %
Neutro Abs: 3.5 K/uL (ref 1.7–7.7)
Neutrophils Relative %: 84 %
Platelets: 62 K/uL — ABNORMAL LOW (ref 150–400)
RBC: 2.93 MIL/uL — ABNORMAL LOW (ref 4.22–5.81)
RDW: 14.8 % (ref 11.5–15.5)
WBC: 4.1 K/uL (ref 4.0–10.5)

## 2015-06-08 LAB — URINE MICROSCOPIC-ADD ON

## 2015-06-08 LAB — I-STAT CG4 LACTIC ACID, ED: LACTIC ACID, VENOUS: 1.73 mmol/L (ref 0.5–2.0)

## 2015-06-08 LAB — URINALYSIS, ROUTINE W REFLEX MICROSCOPIC
Glucose, UA: NEGATIVE mg/dL
HGB URINE DIPSTICK: NEGATIVE
Ketones, ur: NEGATIVE mg/dL
Nitrite: NEGATIVE
PROTEIN: 30 mg/dL — AB
Specific Gravity, Urine: 1.034 — ABNORMAL HIGH (ref 1.005–1.030)
pH: 6 (ref 5.0–8.0)

## 2015-06-08 LAB — BASIC METABOLIC PANEL
ANION GAP: 8 (ref 5–15)
ANION GAP: 6 (ref 5–15)
BUN: 13 mg/dL (ref 6–20)
BUN: 12 mg/dL (ref 6–20)
CALCIUM: 8.1 mg/dL — AB (ref 8.9–10.3)
CALCIUM: 7.9 mg/dL — AB (ref 8.9–10.3)
CO2: 25 mmol/L (ref 22–32)
CO2: 26 mmol/L (ref 22–32)
Chloride: 93 mmol/L — ABNORMAL LOW (ref 101–111)
Chloride: 93 mmol/L — ABNORMAL LOW (ref 101–111)
Creatinine, Ser: 0.32 mg/dL — ABNORMAL LOW (ref 0.61–1.24)
Creatinine, Ser: 0.41 mg/dL — ABNORMAL LOW (ref 0.61–1.24)
GFR calc Af Amer: >60 mL/min (ref >60)
GFR calc Af Amer: >60 mL/min (ref >60)
GLUCOSE: 117 mg/dL — AB (ref 65–99)
GLUCOSE: 129 mg/dL — AB (ref 65–99)
Potassium: 4.1 mmol/L (ref 3.5–5.1)
Potassium: 3.7 mmol/L (ref 3.5–5.1)
SODIUM: 126 mmol/L — AB (ref 135–145)
Sodium: 125 mmol/L — ABNORMAL LOW (ref 135–145)

## 2015-06-08 LAB — EXPECTORATED SPUTUM ASSESSMENT W GRAM STAIN, RFLX TO RESP C

## 2015-06-08 LAB — STREP PNEUMONIAE URINARY ANTIGEN: Strep Pneumo Urinary Antigen: NEGATIVE

## 2015-06-08 LAB — EXPECTORATED SPUTUM ASSESSMENT W REFEX TO RESP CULTURE

## 2015-06-08 MED ORDER — SODIUM CHLORIDE 0.9 % IJ SOLN
10.0000 mL | Freq: Two times a day (BID) | INTRAMUSCULAR | Status: DC
  Administered 2015-06-12: 20 mL
  Administered 2015-06-13 (×2): 10 mL
  Administered 2015-06-14: 20 mL
  Administered 2015-06-14 – 2015-06-21 (×8): 10 mL
  Administered 2015-06-21: 20 mL

## 2015-06-08 MED ORDER — SODIUM CHLORIDE 0.9 % IV SOLN
INTRAVENOUS | Status: DC
  Administered 2015-06-08 – 2015-06-20 (×5): via INTRAVENOUS

## 2015-06-08 MED ORDER — ONDANSETRON HCL 4 MG PO TABS: 8.0000 mg | ORAL_TABLET | Freq: Three times a day (TID) | ORAL | Status: DC | PRN

## 2015-06-08 MED ORDER — SENNOSIDES-DOCUSATE SODIUM 8.6-50 MG PO TABS
1.0000 | ORAL_TABLET | Freq: Two times a day (BID) | ORAL | Status: DC
  Administered 2015-06-08 – 2015-06-17 (×17): 1 via ORAL
  Filled 2015-06-08 (×22): qty 1

## 2015-06-08 MED ORDER — HYDROCOD POLST-CPM POLST ER 10-8 MG/5ML PO SUER
5.0000 mL | Freq: Two times a day (BID) | ORAL | Status: DC
  Administered 2015-06-08 (×2): 5 mL via ORAL
  Filled 2015-06-08 (×2): qty 5

## 2015-06-08 MED ORDER — SODIUM CHLORIDE 0.9 % IJ SOLN
10.0000 mL | INTRAMUSCULAR | Status: DC | PRN
  Administered 2015-06-09 – 2015-06-11 (×3): 10 mL
  Filled 2015-06-08 (×3): qty 40

## 2015-06-08 MED ORDER — HYDROCOD POLST-CPM POLST ER 10-8 MG/5ML PO SUER
10.0000 mL | Freq: Two times a day (BID) | ORAL | Status: DC
  Administered 2015-06-09 – 2015-06-21 (×24): 10 mL via ORAL
  Filled 2015-06-08 (×26): qty 10

## 2015-06-08 MED ORDER — ENOXAPARIN SODIUM 40 MG/0.4ML ~~LOC~~ SOLN: 40.0000 mg | SUBCUTANEOUS | Status: DC

## 2015-06-08 MED ORDER — ENOXAPARIN SODIUM 40 MG/0.4ML ~~LOC~~ SOLN
40.0000 mg | SUBCUTANEOUS | Status: DC
  Administered 2015-06-08: 40 mg via SUBCUTANEOUS
  Filled 2015-06-08: qty 0.4

## 2015-06-08 MED ORDER — DEXTROSE 5 % IV SOLN
2.0000 g | Freq: Three times a day (TID) | INTRAVENOUS | Status: AC
  Administered 2015-06-08 – 2015-06-15 (×24): 2 g via INTRAVENOUS
  Filled 2015-06-08 (×26): qty 2

## 2015-06-08 MED ORDER — ENSURE ENLIVE PO LIQD
237.0000 mL | Freq: Two times a day (BID) | ORAL | Status: DC
  Administered 2015-06-08 – 2015-06-22 (×16): 237 mL via ORAL

## 2015-06-08 MED ORDER — VANCOMYCIN HCL IN DEXTROSE 750-5 MG/150ML-% IV SOLN
750.0000 mg | Freq: Three times a day (TID) | INTRAVENOUS | Status: DC
  Administered 2015-06-08 – 2015-06-10 (×7): 750 mg via INTRAVENOUS
  Filled 2015-06-08 (×8): qty 150

## 2015-06-08 MED ORDER — SENNA-DOCUSATE SODIUM 8.6-50 MG PO TABS: 1.0000 | ORAL_TABLET | Freq: Two times a day (BID) | ORAL | Status: DC

## 2015-06-08 MED ORDER — GUAIFENESIN-CODEINE 100-10 MG/5ML PO SOLN
10.0000 mL | ORAL | Status: DC | PRN
  Administered 2015-06-09 – 2015-06-18 (×13): 10 mL via ORAL
  Filled 2015-06-08 (×15): qty 10

## 2015-06-08 MED ORDER — POLYETHYLENE GLYCOL 3350 17 G PO PACK
17.0000 g | PACK | Freq: Every day | ORAL | Status: DC | PRN
  Administered 2015-06-17: 17 g via ORAL
  Filled 2015-06-08: qty 1

## 2015-06-08 MED ORDER — IBUPROFEN 200 MG PO TABS: 600.0000 mg | ORAL_TABLET | Freq: Four times a day (QID) | ORAL | Status: DC | PRN

## 2015-06-08 MED ORDER — ACETAMINOPHEN 325 MG PO TABS
650.0000 mg | ORAL_TABLET | Freq: Four times a day (QID) | ORAL | Status: DC | PRN
  Administered 2015-06-10 – 2015-06-15 (×2): 650 mg via ORAL
  Filled 2015-06-08 (×2): qty 2

## 2015-06-08 NOTE — Progress Notes (Signed)
Patient called RN into room stating that his LUE was swollen.  When assessing all extremities, LUE was noted to have 3+ pitting edema primarily from the elbow down to his wrist and 2+ pitting edema in his right elbow.  Patient did not have this edema earlier in this shift.  MD was paged with findings.  No new orders received at this time.

## 2015-06-08 NOTE — Progress Notes (Signed)
Notified about LUE swelling and some rt elbow swelling . Has pitting edema on left. Non tender. He has SVC compression with malignancy which is likely contributing to this. i dont suspect DVT.

## 2015-06-08 NOTE — Progress Notes (Signed)
Initial Nutrition Assessment  DOCUMENTATION CODES:   Non-severe (moderate) malnutrition in context of chronic illness  INTERVENTION:  -Ensure Enlive po BID, each supplement provides 350 kcal and 20 grams of protein -RD to continue to monitor for needs  NUTRITION DIAGNOSIS:   Malnutrition related to poor appetite, chronic illness as evidenced by percent weight loss, mild depletion of body fat, mild depletion of muscle mass.   GOAL:   Patient will meet greater than or equal to 90% of their needs  MONITOR:   PO intake, Supplement acceptance, Labs, I & O's  REASON FOR ASSESSMENT:   Malnutrition Screening Tool    ASSESSMENT:   Bill Valdez is a 56 y.o. male w no sig PMH who presented in Sept '16 with wt loss, SOB and chest pain. Dx'd with stage 4 lung cancer and is currently getting chemoRx w oncology here at [facility]. Has liver mets , SVC syndrome, numerous bony mets. Presenting today w SOB of one day's duration. CXR here shows new RLL consolidation/ effusion and patchy LLL infiltrate. Temp is 102 deg, WBC 3. Asked to see for admission for PNA.   Spoke with pt at bedside. Pt reports poor appetite and 20# wt loss in about 3 weeks. Per chart, pt has suffered an 18#/11% severe wt loss in 3 weeks. Pt reports SOB and coughing as an issue with intake as well. Encouraged pt to drink Ensure/Boost at home, will order during stay.  No PO intake on file at this point. Pt also reports some issues with jaw and chewing that "comes and goes." He was recently upgraded to normal diet, spoke with nurse, will monitor PO intake for need of NDD3 or Soft diet.  Labs and Medications reviewed  Diet Order:  Diet regular Room service appropriate?: Yes; Fluid consistency:: Thin  Skin:  Reviewed, no issues  Last BM:  06/07/2015  Height:   Ht Readings from Last 1 Encounters:  06/07/15 '5\' 11"'$  (1.803 m)    Weight:   Wt Readings from Last 1 Encounters:  06/07/15 147 lb (66.679 kg)    Ideal Body  Weight:  78.18 kg  BMI:  Body mass index is 20.51 kg/(m^2).  Estimated Nutritional Needs:   Kcal:  2000-2400 calories  Protein:  90-115 grams  Fluid:  >/= 2L  EDUCATION NEEDS:   No education needs identified at this time  Satira Anis. Junior Huezo, MS, RD LDN After Hours/Weekend Pager [phone]

## 2015-06-08 NOTE — Progress Notes (Signed)
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 Julien Nordmann presented to the ED with 1 day duration of shortness of breath with nonproductive cough and subjective fever.  Patient found to be septic on admission with fever of 102F  pancytopenia, tachycardia and tachypnea with pancytopenia. He was also found to be hyponatremic. Admitted to hospitalist service for sepsis secondary to right lobar pneumonia.     Assessment/Plan: Sepsis secondary to right lobar pneumonia/healthcare associated pneumonia Afebrile this morning but still has some tachycardia. Maintaining O2 sat. Sepsis pathway initiated in the ED and patient placed on empiric vancomycin and aztreonam. Continue supportive care with Tylenol and Tussinox. Will add Robitussin as well. Follow blood culture, urine for strep antigen and Legionella.  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 He received palliative radiotherapy to the large right upper lobe lung mass followed by treatment with BMS 568 (nivolumab/ipilumumab) at [place], discontinued recently secondary to disease progression. He is now on systemic chemotherapy with cisplatin and gemcitabine and for trazodone every 3 weeks. (Completed first cycle last week) Follows with Dr. Julien Nordmann who is informed of patient's hospitalization.  Hyponatremia Patient appears to be hypovolemic. Continue with IV hydration. Follow-up labs in a.m.  Pancytopenia Secondary to  chemotherapy   DVT prophylaxis: SCDs  Diet: Regular  Code Status: full code Family Communication: Friend at bedside Disposition Plan: Continue inpatient monitoring.   Consultants:  Dr. Julien Nordmann  Procedures:  None  Antibiotics:  IV vancomycin and aztreonam since 1/2  HPI/Subjective: Seen and examined. Complains of nonproductive cough.   Objective: Filed Vitals:   06/08/15 0245 06/08/15 0554  BP: 103/62 122/51  Pulse: 119 122  Temp: 97.6 F (36.4 C) 98 F (36.7 C)  Resp: 22 20    Intake/Output Summary (Last 24 hours) at 06/08/15 1438 Last data filed at 06/08/15 1315  Gross per 24 hour  Intake 951.67 ml  Output    200 ml  Net 751.67 ml   Filed Weights   06/07/15 2049 06/07/15 2107  Weight: 68.04 kg (150 lb) 66.679 kg (147 lb)    Exam:   General:  Middle aged male not in distress  HEENT: Pallor present, dry mucosa, supple neck  Chest: Diminished breath sounds over right lung, no added sounds  Cardiovascular: Normal S1 and S2, no murmurs rub gallop  GI: Soft, nondistended, nontender, bowel sounds present  Musculoskeletal: Warm, no edema  CNS: Alert and oriented  Data Reviewed: Basic Metabolic Panel:  Recent Labs Lab 06/02/15 0812 06/07/15 2030 06/08/15 0425  NA 127* 125* 126*  K 4.8 4.1 4.1  CL  --  89* 93*  CO2 '25 26 25  '$ GLUCOSE 106 146* 117*  BUN 10.'9 16 13  '$ CREATININE 0.6* 0.49* 0.32*  CALCIUM 9.2 8.3* 8.1*  MG 2.0  --   --    Liver Function Tests:  Recent Labs Lab 06/02/15 0812 06/07/15 2030  AST 25 45*  ALT 18 28  ALKPHOS 161* 146*  BILITOT 0.89 1.2  PROT 7.1 5.8*  ALBUMIN 2.3* 2.0*   No results for input(s): LIPASE, AMYLASE in the last 168 hours. No results  for input(s): AMMONIA in the last 168 hours. CBC:  Recent Labs Lab 06/02/15 0812 06/07/15 2030 06/08/15 0425  WBC 8.3 3.6* 4.1  NEUTROABS 7.7* 3.2 3.5  HGB 10.6* 9.4* 8.2*  HCT 32.2* 28.2* 24.5*  MCV 83.9 83.7 83.6  PLT 202 84* 62*   Cardiac  Enzymes: No results for input(s): CKTOTAL, CKMB, CKMBINDEX, TROPONINI in the last 168 hours. BNP (last 3 results) No results for input(s): BNP in the last 8760 hours.  ProBNP (last 3 results) No results for input(s): PROBNP in the last 8760 hours.  CBG: No results for input(s): GLUCAP in the last 168 hours.  Recent Results (from the past 240 hour(s))  Culture, expectorated sputum-assessment     Status: None   Collection Time: 06/08/15 10:20 AM  Result Value Ref Range Status   Specimen Description SPUTUM  Final   Special Requests NONE  Final   Sputum evaluation   Final    THIS SPECIMEN IS ACCEPTABLE. RESPIRATORY CULTURE REPORT TO FOLLOW.   Report Status 06/08/2015 FINAL  Final     Studies: Dg Chest Portable 1 View  06/07/2015  CLINICAL DATA:  Worsening shortness of breath today and fever. Metastatic lung cancer with metastasis to liver, brain and bone. EXAM: PORTABLE CHEST 1 VIEW COMPARISON:  Most recent imaging PET-CT 02/23/2015 FINDINGS: Tip of the left chest port in the SVC. Right suprahilar mass again seen, with surrounding irregular opacity. Volume loss in the right hemithorax with elevation of right hemidiaphragm and right pleural effusion. Diffuse multifocal opacities throughout the lower right hemithorax. There is an ill-defined patchy opacity at the left lung base. Mediastinal contours are grossly unchanged allowing for differences in technique. Callus formation noted about left lateral rib fracture. IMPRESSION: 1. Volume loss in the right hemithorax with right perihilar mass, right basilar opacity and pleural effusion. No interval exams since staging PET-CT, and findings may reflect a combination of post treatment related change, atelectasis or collapse, or pneumonia. 2. Patchy opacity at the left lung base is nonspecific, pneumonia is favored, however new metastatic disease not excluded radiographically. Electronically Signed   By: Jeb Levering M.D.   On: 06/07/2015 21:29     Scheduled Meds: . aztreonam  2 g Intravenous 3 times per day  . chlorpheniramine-HYDROcodone  10 mL Oral Q12H  . enoxaparin (LOVENOX) injection  40 mg Subcutaneous Q24H  . feeding supplement (ENSURE ENLIVE)  237 mL Oral BID BM  . senna-docusate  1 tablet Oral BID  . sodium chloride  10-40 mL Intracatheter Q12H  . vancomycin  750 mg Intravenous Q8H   Continuous Infusions: . sodium chloride 125 mL/hr at 06/08/15 1129      Time spent: 35 minutes    Umair Rosiles  [facility] Pager [phone]. If 7PM-7AM, please contact night-coverage at [email or web address], password [facility] 06/08/2015, 2:38 PM  LOS: 1 day

## 2015-06-09 ENCOUNTER — Ambulatory Visit: Payer: BLUE CROSS/BLUE SHIELD

## 2015-06-09 ENCOUNTER — Other Ambulatory Visit: Payer: BLUE CROSS/BLUE SHIELD

## 2015-06-09 LAB — BASIC METABOLIC PANEL
ANION GAP: 8 (ref 5–15)
BUN: 11 mg/dL (ref 6–20)
CALCIUM: 8 mg/dL — AB (ref 8.9–10.3)
CO2: 25 mmol/L (ref 22–32)
Chloride: 93 mmol/L — ABNORMAL LOW (ref 101–111)
Creatinine, Ser: 0.33 mg/dL — ABNORMAL LOW (ref 0.61–1.24)
GFR calc Af Amer: >60 mL/min (ref >60)
GLUCOSE: 95 mg/dL (ref 65–99)
POTASSIUM: 3.7 mmol/L (ref 3.5–5.1)
SODIUM: 126 mmol/L — AB (ref 135–145)

## 2015-06-09 LAB — CBC
HCT: 26 % — ABNORMAL LOW (ref 39.0–52.0)
Hemoglobin: 8.5 g/dL — ABNORMAL LOW (ref 13.0–17.0)
MCH: 27.4 pg (ref 26.0–34.0)
MCHC: 32.7 g/dL (ref 30.0–36.0)
MCV: 83.9 fL (ref 78.0–100.0)
PLATELETS: 62 10*3/uL — AB (ref 150–400)
RBC: 3.1 MIL/uL — AB (ref 4.22–5.81)
RDW: 14.9 % (ref 11.5–15.5)
WBC: 7.6 10*3/uL (ref 4.0–10.5)

## 2015-06-09 LAB — URINE CULTURE: Culture: NO GROWTH

## 2015-06-09 NOTE — Clinical Documentation Improvement (Signed)
Internal Medicine   Please clarify if Sepsis was present or evolving on admission (POA) or if Sepsis was acquired after admission. Diagnosis not seen on Problem List. Document findings in next progress note; NOT IN BPA DROP DOWN BOX. Thanks!   Other  Clinically Undetermined  Please exercise your independent, professional judgment when responding. A specific answer is not anticipated or expected.  Thank You, Zoila Shutter RN, BSN, [place] [phone]; Cell: [phone]

## 2015-06-09 NOTE — Care Management Note (Signed)
Case Management Note  Patient Details  Name: Bill Valdez MRN: [id number] Date of Birth: 1960/10/02  Subjective/Objective: 55 y/o m admitted w/PNA. Hx: NSCLCA. From home.                   Action/Plan:d/c plan home.   Expected Discharge Date:   (unknown)               Expected Discharge Plan:  Home/Self Care  In-House Referral:     Discharge planning Services  CM Consult  Post Acute Care Choice:    Choice offered to:     DME Arranged:    DME Agency:     HH Arranged:    HH Agency:     Status of Service:  In process, will continue to follow  Medicare Important Message Given:    Date Medicare IM Given:    Medicare IM give by:    Date Additional Medicare IM Given:    Additional Medicare Important Message give by:     If discussed at [place] of Stay Meetings, dates discussed:    Additional Comments:  Dessa Phi, RN 06/09/2015, 10:16 AM

## 2015-06-09 NOTE — Progress Notes (Addendum)
Patient ID: Bill Valdez, male   DOB: Jan 03, 1961, 55 y.o.   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 Julien Nordmann who presented to [facility] with reports of subjective fevers, nonproductive cough. He was found to be septic on the admission and was started on broad-spectrum antibiotics for possible pneumonia.  Assessment/Plan:    Principal problem: Sepsis secondary to right lobar pneumonia / healthcare associated pneumonia / leukopenia - Sepsis criteria met on the admission with presumed source of infection pneumonia. - Chest x-ray on the admission showed patchy opacity at the left lung base which is nonspecific versus possible pneumonia or new metastatic disease.also seen was right perihilar mass and right basilar opacity with pleural effusion which could be a combination of posttreatment changes as well as pneumonia. - He was started on aztreonam and vancomycin - Respiratory status is stable - Respiratory culture is pending,blood cultures are pending - Strep pneumonia is negative  Active problems: Metastatic non-small cell lung cancer stage IV - Pt initially presented with right upper lobe suprahilar mass with questionable SVC syndrome in addition to mediastinal lymphadenopathy with extensive bone and liver metastases in September 2016.  - Pt received palliative radiotherapy to the large right upper lobe lung mass followed by treatment with BMS 568 (nivolumab/ipilumumab) at [place], discontinued recently secondary to disease progression. - Now on systemic chemotherapy with cisplatin and gemcitabine  - Pt follows with Dr. Julien Nordmann    Hyponatremia - Likely secondary to dehydration versus SIADH from malignancy - Continue IV fluids - Sodium is 126  Pancytopenia - Likely sequela of chemotherapy and history of malignancy - Monitor daily CBC  Severe protein calorie malnutrition - In the context of chronic illness - Continue nutritional supplementation   DVT Prophylaxis  - SCDs bilaterally   Code Status: Full.  Family Communication:  plan of care discussed with the patient Disposition Plan: Home once Respiratory status stable, anticipate by 06/12/2015  IV access:  Peripheral IV  Procedures and diagnostic studies:    Dg Chest Portable 1 View 06/07/2015  1. Volume loss in the right hemithorax with right perihilar mass, right basilar opacity and pleural effusion. No interval exams since staging PET-CT, and findings may reflect a combination of post treatment related change, atelectasis or collapse, or pneumonia. 2. Patchy opacity at the left lung base is nonspecific, pneumonia is favored, however new metastatic disease not excluded radiographically.   Medical Consultants:  None  Other Consultants:  None  IAnti-Infectives:   Aztreonam and vancomycin 06/07/2015 -->   Leisa Lenz, MD  [facility] Pager [phone]  Time spent in minutes: 25 minutes  If 7PM-7AM, please contact night-coverage [email or web address] Password [email or web address] 06/09/2015, 2:12 PM   LOS: 2 days    HPI/Subjective: No acute overnight events. Patient reports he feels little better this morning.  Objective: Filed Vitals:   06/08/15 1830 06/08/15 2109 06/09/15 0404 06/09/15 1342  BP:  106/49 112/52 119/63  Pulse:  122 72 118  Temp:  98.5 F (36.9 C) 98.1 F (36.7 C) 97.2 F (36.2 C)  TempSrc:  Oral Oral Oral  Resp:  21 20 20   Height:      Weight:      SpO2: 92%  97% 93%  Intake/Output Summary (Last 24 hours) at 06/09/15 1412 Last data filed at 06/09/15 0928  Gross per 24 hour  Intake 3673.33 ml  Output    550 ml  Net 3123.33  ml    Exam:   General:  Pt is alert, follows commands appropriately, not in acute distress  Cardiovascular: Regular rate and rhythm, S1/S2, no murmurs  Respiratory: diminished breath sounds, no wheezing  Abdomen: Soft, non tender, non distended, bowel sounds present  Extremities: No edema, pulses DP and PT palpable bilaterally  Neuro: Grossly nonfocal  Data Reviewed: Basic Metabolic Panel:  Recent Labs Lab 06/07/15 2030 06/08/15 0425 06/08/15 1602 06/09/15 0404  NA 125* 126* 125* 126*  K 4.1 4.1 3.7 3.7  CL 89* 93* 93* 93*  CO2 26 25 26 25   GLUCOSE 146* 117* 129* 95  BUN 16 13 12 11   CREATININE 0.49* 0.32* 0.41* 0.33*  CALCIUM 8.3* 8.1* 7.9* 8.0*   Liver Function Tests:  Recent Labs Lab 06/07/15 2030  AST 45*  ALT 28  ALKPHOS 146*  BILITOT 1.2  PROT 5.8*  ALBUMIN 2.0*   No results for input(s): LIPASE, AMYLASE in the last 168 hours. No results for input(s): AMMONIA in the last 168 hours. CBC:  Recent Labs Lab 06/07/15 2030 06/08/15 0425 06/09/15 0404  WBC 3.6* 4.1 7.6  NEUTROABS 3.2 3.5  --   HGB 9.4* 8.2* 8.5*  HCT 28.2* 24.5* 26.0*  MCV 83.7 83.6 83.9  PLT 84* 62* 62*   Cardiac Enzymes: No results for input(s): CKTOTAL, CKMB, CKMBINDEX, TROPONINI in the last 168 hours. BNP: Invalid input(s): POCBNP CBG: No results for input(s): GLUCAP in the last 168 hours.  Recent Results (from the past 240 hour(s))  Urine culture     Status: None   Collection Time: 06/08/15  2:10 AM  Result Value Ref Range Status   Specimen Description URINE, CLEAN CATCH  Final   Special Requests NONE  Final   Culture   Final    NO GROWTH 1 DAY Performed at [facility]    Report Status 06/09/2015 FINAL  Final  Culture, respiratory (NON-Expectorated)     Status: None (Preliminary result)   Collection Time: 06/08/15 10:20 AM  Result Value Ref Range Status   Specimen Description SPUTUM  Final   Special Requests NONE  Final   Gram Stain   Final     ABUNDANT WBC PRESENT,BOTH PMN AND MONONUCLEAR FEW SQUAMOUS EPITHELIAL CELLS PRESENT ABUNDANT GRAM VARIABLE ROD ABUNDANT GRAM POSITIVE COCCI IN PAIRS    Culture   Final    Culture reincubated for better growth Performed at [facility]    Report Status PENDING  Incomplete  Culture, expectorated sputum-assessment     Status: None   Collection Time: 06/08/15 10:20 AM  Result Value Ref Range Status   Specimen Description SPUTUM  Final   Special Requests NONE  Final   Sputum evaluation   Final    THIS SPECIMEN IS ACCEPTABLE. RESPIRATORY CULTURE REPORT TO FOLLOW.   Report Status 06/08/2015 FINAL  Final     Scheduled Meds: . aztreonam  2 g Intravenous 3 times per day  . chlorpheniramine-HYDROcodone  10 mL Oral Q12H  . feeding supplement (ENSURE ENLIVE)  237 mL Oral BID BM  . senna-docusate  1 tablet Oral BID  . sodium chloride  10-40 mL Intracatheter Q12H  . vancomycin  750 mg Intravenous Q8H   Continuous Infusions: . sodium chloride 125 mL/hr at 06/09/15 0504  the

## 2015-06-09 NOTE — Clinical Documentation Improvement (Signed)
Internal Medicine  Can the diagnosis of SOB be further clarified? This was noted in ED documentation. Please document findings in next progress note; NOT IN BPA DROP DOWN BOX. Thank you!   Other  Clinically Undetermined  Supporting Information:  Lung cancer with lobar pneumonia  HR > 100  RR ranging from 22 to 38  Sats ranging from 87 to 95% with oxygen  Placed in 6L FiO2  Please exercise your independent, professional judgment when responding. A specific answer is not anticipated or expected.  Thank You, Zoila Shutter RN, BSN, [place] [phone]; Cell: [phone]

## 2015-06-10 LAB — CULTURE, RESPIRATORY: CULTURE: NORMAL

## 2015-06-10 LAB — VANCOMYCIN, TROUGH: Vancomycin Tr: 8 ug/mL — ABNORMAL LOW (ref 10.0–20.0)

## 2015-06-10 LAB — CULTURE, RESPIRATORY W GRAM STAIN

## 2015-06-10 MED ORDER — VANCOMYCIN HCL IN DEXTROSE 1-5 GM/200ML-% IV SOLN
1000.0000 mg | Freq: Three times a day (TID) | INTRAVENOUS | Status: DC
  Administered 2015-06-10 – 2015-06-11 (×3): 1000 mg via INTRAVENOUS
  Filled 2015-06-10 (×6): qty 200

## 2015-06-10 MED ORDER — BENZONATATE 100 MG PO CAPS
100.0000 mg | ORAL_CAPSULE | Freq: Two times a day (BID) | ORAL | Status: DC
  Administered 2015-06-10 – 2015-06-22 (×24): 100 mg via ORAL
  Filled 2015-06-10 (×28): qty 1

## 2015-06-10 NOTE — Progress Notes (Addendum)
Patient ID: Bill Valdez, male   DOB: 08/09/1960, 55 y.o.   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 Julien Nordmann who presented to [facility] with reports of subjective fevers, nonproductive cough. He was found to be septic on the admission and was started on broad-spectrum antibiotics for possible pneumonia.  Assessment/Plan:    Principal problem: Sepsis secondary to right lobar pneumonia / healthcare associated pneumonia / leukopenia - Sepsis criteria were met on the admission with presumed source of infection pneumonia. - Chest x-ray on the admission showed patchy opacity at the left lung base, possible pneumonia or new metastatic disease. CXR also showed right perihilar mass and right basilar opacity with pleural effusion which could be a combination of posttreatment changes as well as pneumonia. - Continue aztreonam and vancomycin - Blood cultures and respiratory cultures so far show no growth. Strep pneumoniae is negative. - Continue tussionex scheduled twice daily,  guaifenesin-codeine every 4 hours as needed and we added Tessalon capsule twice daily.   Active problems: Metastatic non-small cell lung cancer stage IV - Presented with right upper lobe suprahilar mass with questionable SVC syndrome in addition to mediastinal lymphadenopathy with extensive bone and liver metastases in September 2016.  - Pt received palliative radiotherapy to the large right upper lobe lung mass followed by treatment with BMS 568 (nivolumab/ipilumumab) at [place], discontinued recently secondary to disease progression. - On systemic chemotherapy with  cisplatin and gemcitabine  - Follows with Dr. 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 - Patient has received IV fluids since admission. We will stop IV fluids today.  Pancytopenia - Likely sequela of chemotherapy and history of malignancy - Follow-up CBC tomorrow morning  Severe protein calorie malnutrition - In the context of chronic illness - Continue ensure supplementation  DVT Prophylaxis  - SCDs bilaterally   Code Status: Full.  Family Communication:  plan of care discussed with the patient Disposition Plan: Home once Respiratory status stable, anticipate by 06/12/2015  IV access:  Peripheral IV  Procedures and diagnostic studies:    Dg Chest Portable 1 View 06/07/2015  1. Volume loss in the right hemithorax with right perihilar mass, right basilar opacity and pleural effusion. No interval exams since staging PET-CT, and findings may reflect a combination of post treatment related change, atelectasis or collapse, or pneumonia. 2. Patchy opacity at the left lung base is nonspecific, pneumonia is favored, however new metastatic disease not excluded radiographically.   Medical Consultants:  None  Other Consultants:  None  IAnti-Infectives:   Aztreonam and vancomycin 06/07/2015 -->   Leisa Lenz, MD  [facility] Pager [phone]  Time spent in minutes: 15 minutes  If 7PM-7AM, please contact night-coverage [email or web address] Password [email or web address] 06/10/2015, 1:57 PM   LOS: 3 days    HPI/Subjective: No acute overnight events. Patient reports he is still coughing.  Objective: Filed Vitals:   06/09/15 0404 06/09/15 1342 06/09/15 2001 06/10/15 0613  BP: 112/52 119/63 113/64 111/63  Pulse: 72 118 120 106  Temp: 98.1 F (36.7 C) 97.2 F (36.2 C) 98.2 F (36.8 C) 98 F (36.7 C)  TempSrc: Oral Oral Oral Oral  Resp: 20 20 20  18  Height:      Weight:      SpO2: 97% 93% 93% 92%    Intake/Output Summary  (Last 24 hours) at 06/10/15 1357 Last data filed at 06/10/15 0700  Gross per 24 hour  Intake   3965 ml  Output    425 ml  Net   3540 ml    Exam:   General:  Pt is alert, not in acute distress  Cardiovascular: RRR, S1/S2 (+)  Respiratory: Coughing a lot, congested with coarse breath sounds  Abdomen: Nontender abdomen, non-distended, appreciate bowel sounds  Extremities: No leg swelling, pulses palpable  Neuro: No focal deficits  Data Reviewed: Basic Metabolic Panel:  Recent Labs Lab 06/07/15 2030 06/08/15 0425 06/08/15 1602 06/09/15 0404  NA 125* 126* 125* 126*  K 4.1 4.1 3.7 3.7  CL 89* 93* 93* 93*  CO2 26 25 26 25   GLUCOSE 146* 117* 129* 95  BUN 16 13 12 11   CREATININE 0.49* 0.32* 0.41* 0.33*  CALCIUM 8.3* 8.1* 7.9* 8.0*   Liver Function Tests:  Recent Labs Lab 06/07/15 2030  AST 45*  ALT 28  ALKPHOS 146*  BILITOT 1.2  PROT 5.8*  ALBUMIN 2.0*   No results for input(s): LIPASE, AMYLASE in the last 168 hours. No results for input(s): AMMONIA in the last 168 hours. CBC:  Recent Labs Lab 06/07/15 2030 06/08/15 0425 06/09/15 0404  WBC 3.6* 4.1 7.6  NEUTROABS 3.2 3.5  --   HGB 9.4* 8.2* 8.5*  HCT 28.2* 24.5* 26.0*  MCV 83.7 83.6 83.9  PLT 84* 62* 62*   Cardiac Enzymes: No results for input(s): CKTOTAL, CKMB, CKMBINDEX, TROPONINI in the last 168 hours. BNP: Invalid input(s): POCBNP CBG: No results for input(s): GLUCAP in the last 168 hours.  Recent Results (from the past 240 hour(s))  Blood Culture (routine x 2)     Status: None (Preliminary result)   Collection Time: 06/07/15  9:45 PM  Result Value Ref Range Status   Specimen Description BLOOD PORTA CATH  Final   Special Requests BOTTLES DRAWN AEROBIC AND ANAEROBIC 5 ML  Final   Culture   Final    NO GROWTH 2 DAYS Performed at [facility]    Report Status PENDING  Incomplete  Blood Culture (routine x 2)     Status: None (Preliminary result)   Collection Time: 06/07/15  9:46 PM   Result Value Ref Range Status   Specimen Description BLOOD RIGHT ANTECUBITAL  Final   Special Requests BOTTLES DRAWN AEROBIC AND ANAEROBIC 5 ML  Final   Culture   Final    NO GROWTH 2 DAYS Performed at [facility]    Report Status PENDING  Incomplete  Urine culture     Status: None   Collection Time: 06/08/15  2:10 AM  Result Value Ref Range Status   Specimen Description URINE, CLEAN CATCH  Final   Special Requests NONE  Final   Culture   Final    NO GROWTH 1 DAY Performed at [facility]    Report Status 06/09/2015 FINAL  Final  Culture, respiratory (NON-Expectorated)     Status: None   Collection Time: 06/08/15 10:20 AM  Result Value Ref Range Status   Specimen Description SPUTUM  Final   Special Requests NONE  Final   Gram Stain   Final    ABUNDANT WBC PRESENT,BOTH PMN AND MONONUCLEAR FEW SQUAMOUS EPITHELIAL CELLS PRESENT ABUNDANT GRAM VARIABLE ROD ABUNDANT GRAM POSITIVE COCCI IN PAIRS  Culture   Final    NORMAL OROPHARYNGEAL FLORA Performed at [facility]    Report Status 06/10/2015 FINAL  Final  Culture, expectorated sputum-assessment     Status: None   Collection Time: 06/08/15 10:20 AM  Result Value Ref Range Status   Specimen Description SPUTUM  Final   Special Requests NONE  Final   Sputum evaluation   Final    THIS SPECIMEN IS ACCEPTABLE. RESPIRATORY CULTURE REPORT TO FOLLOW.   Report Status 06/08/2015 FINAL  Final     Scheduled Meds: . aztreonam  2 g Intravenous 3 times per day  . benzonatate  100 mg Oral BID  . chlorpheniramine-HYDROcodone  10 mL Oral Q12H  . feeding supplement (ENSURE ENLIVE)  237 mL Oral BID BM  . senna-docusate  1 tablet Oral BID  . sodium chloride  10-40 mL Intracatheter Q12H  . vancomycin  1,000 mg Intravenous Q8H   Continuous Infusions: . sodium chloride 10 mL/hr at 06/10/15 0946

## 2015-06-10 NOTE — Progress Notes (Signed)
ANTIBIOTIC CONSULT NOTE - INITIAL  Pharmacy Consult for vancomycin Indication: Sepsis, PNA  Allergies  Allergen Reactions  . Penicillins Other (See Comments)    Childhood Has patient had a PCN reaction causing immediate rash, facial/tongue/throat swelling, SOB or lightheadedness with hypotension: No. Has patient had a PCN reaction causing severe rash involving mucus membranes or skin necrosis: No Has patient had a PCN reaction that required hospitalization No Has patient had a PCN reaction occurring within the last 10 years: No If all of the above answers are "NO", then may proceed with Cephalospor    Patient Measurements: Height: '5\' 11"'$  (180.3 cm) Weight: 147 lb (66.679 kg) IBW/kg (Calculated) : 75.3 Adjusted Body Weight:   Vital Signs: Temp: 98 F (36.7 C) (01/05 0613) Temp Source: Oral (01/05 3299) BP: 111/63 mmHg (01/05 2426) Pulse Rate: 106 (01/05 0613) Intake/Output from previous day: 01/04 0701 - 01/05 0700 In: 2765 [P.O.:240; I.V.:2125; IV Piggyback:400] Out: 625 [Urine:625] Intake/Output from this shift: Total I/O In: 700 [I.V.:500; IV Piggyback:200] Out: 200 [Urine:200]  Labs:  Recent Labs  06/07/15 2030 06/08/15 0425 06/08/15 1602 06/09/15 0404  WBC 3.6* 4.1  --  7.6  HGB 9.4* 8.2*  --  8.5*  PLT 84* 62*  --  62*  CREATININE 0.49* 0.32* 0.41* 0.33*   Estimated Creatinine Clearance: 99.6 mL/min (by C-G formula based on Cr of 0.33).  Recent Labs  06/10/15 0501  VANCOTROUGH 8*     Microbiology: Recent Results (from the past 720 hour(s))  Blood Culture (routine x 2)     Status: None (Preliminary result)   Collection Time: 06/07/15  9:45 PM  Result Value Ref Range Status   Specimen Description BLOOD PORTA CATH  Final   Special Requests BOTTLES DRAWN AEROBIC AND ANAEROBIC 5 ML  Final   Culture   Final    NO GROWTH 1 DAY Performed at [facility]    Report Status PENDING  Incomplete  Blood Culture (routine x 2)     Status: None  (Preliminary result)   Collection Time: 06/07/15  9:46 PM  Result Value Ref Range Status   Specimen Description BLOOD RIGHT ANTECUBITAL  Final   Special Requests BOTTLES DRAWN AEROBIC AND ANAEROBIC 5 ML  Final   Culture   Final    NO GROWTH 1 DAY Performed at [facility]    Report Status PENDING  Incomplete  Urine culture     Status: None   Collection Time: 06/08/15  2:10 AM  Result Value Ref Range Status   Specimen Description URINE, CLEAN CATCH  Final   Special Requests NONE  Final   Culture   Final    NO GROWTH 1 DAY Performed at [facility]    Report Status 06/09/2015 FINAL  Final  Culture, respiratory (NON-Expectorated)     Status: None (Preliminary result)   Collection Time: 06/08/15 10:20 AM  Result Value Ref Range Status   Specimen Description SPUTUM  Final   Special Requests NONE  Final   Gram Stain   Final    ABUNDANT WBC PRESENT,BOTH PMN AND MONONUCLEAR FEW SQUAMOUS EPITHELIAL CELLS PRESENT ABUNDANT GRAM VARIABLE ROD ABUNDANT GRAM POSITIVE COCCI IN PAIRS    Culture   Final    Culture reincubated for better growth Performed at [facility]    Report Status PENDING  Incomplete  Culture, expectorated sputum-assessment     Status: None   Collection Time: 06/08/15 10:20 AM  Result Value Ref Range Status   Specimen  Description SPUTUM  Final   Special Requests NONE  Final   Sputum evaluation   Final    THIS SPECIMEN IS ACCEPTABLE. RESPIRATORY CULTURE REPORT TO FOLLOW.   Report Status 06/08/2015 FINAL  Final    Medical History: Past Medical History  Diagnosis Date  . Shortness of breath dyspnea   . Cancer [facility])     metastatic lung cancer to liver and brain, bone  . Radiation 02/26/2015    Medications:  Anti-infectives    Start     Dose/Rate Route Frequency Ordered Stop   06/10/15 1200  vancomycin (VANCOCIN) IVPB 1000 mg/200 mL premix     1,000 mg 200 mL/hr over 60 Minutes Intravenous Every 8 hours 06/10/15 0618     06/08/15 0600   vancomycin (VANCOCIN) IVPB 750 mg/150 ml premix  Status:  Discontinued     750 mg 150 mL/hr over 60 Minutes Intravenous Every 8 hours 06/08/15 0126 06/10/15 0618   06/08/15 0015  aztreonam (AZACTAM) 2 g in dextrose 5 % 50 mL IVPB     2 g 100 mL/hr over 30 Minutes Intravenous 3 times per day 06/08/15 0000 06/15/15 2159   06/07/15 2245  ceFEPIme (MAXIPIME) 1 g in dextrose 5 % 50 mL IVPB  Status:  Discontinued     1 g 100 mL/hr over 30 Minutes Intravenous  Once 06/07/15 2230 06/08/15 0028   06/07/15 2245  ceFEPIme (MAXIPIME) 1 g in dextrose 5 % 50 mL IVPB  Status:  Discontinued     1 g 100 mL/hr over 30 Minutes Intravenous  Once 06/07/15 2231 06/07/15 2234   06/07/15 2145  vancomycin (VANCOCIN) 1,500 mg in sodium chloride 0.9 % 500 mL IVPB     1,500 mg 250 mL/hr over 120 Minutes Intravenous STAT 06/07/15 2141 06/08/15 0005     Assessment: Patient with low vancomycin trough.  Prior doses charted correctly.  0600 dose already started after level drawn.  Goal of Therapy:  Vancomycin trough level 15-20 mcg/ml  Plan:  Measure antibiotic drug levels at steady state Follow up culture results Change vancomycin to 1gm iv q8hr, next dose at [place], [place] 06/10/2015,6:19 AM

## 2015-06-11 ENCOUNTER — Inpatient Hospital Stay (HOSPITAL_COMMUNITY): Payer: BLUE CROSS/BLUE SHIELD

## 2015-06-11 DIAGNOSIS — J189 Pneumonia, unspecified organism: Secondary | ICD-10-CM

## 2015-06-11 DIAGNOSIS — A419 Sepsis, unspecified organism: Principal | ICD-10-CM

## 2015-06-11 DIAGNOSIS — D72829 Elevated white blood cell count, unspecified: Secondary | ICD-10-CM

## 2015-06-11 LAB — BASIC METABOLIC PANEL
Anion gap: 8 (ref 5–15)
BUN: 9 mg/dL (ref 6–20)
CALCIUM: 7.8 mg/dL — AB (ref 8.9–10.3)
CO2: 27 mmol/L (ref 22–32)
Chloride: 92 mmol/L — ABNORMAL LOW (ref 101–111)
Creatinine, Ser: <0.3 mg/dL — ABNORMAL LOW (ref 0.61–1.24)
GLUCOSE: 128 mg/dL — AB (ref 65–99)
Potassium: 3.7 mmol/L (ref 3.5–5.1)
Sodium: 127 mmol/L — ABNORMAL LOW (ref 135–145)

## 2015-06-11 LAB — VANCOMYCIN, TROUGH: VANCOMYCIN TR: 11 ug/mL (ref 10.0–20.0)

## 2015-06-11 LAB — CBC
HCT: 26.9 % — ABNORMAL LOW (ref 39.0–52.0)
Hemoglobin: 8.9 g/dL — ABNORMAL LOW (ref 13.0–17.0)
MCH: 27.8 pg (ref 26.0–34.0)
MCHC: 33.1 g/dL (ref 30.0–36.0)
MCV: 84.1 fL (ref 78.0–100.0)
Platelets: 199 10*3/uL (ref 150–400)
RBC: 3.2 MIL/uL — ABNORMAL LOW (ref 4.22–5.81)
RDW: 15.4 % (ref 11.5–15.5)
WBC: 19.8 10*3/uL — ABNORMAL HIGH (ref 4.0–10.5)

## 2015-06-11 MED ORDER — ALBUTEROL SULFATE (2.5 MG/3ML) 0.083% IN NEBU
2.5000 mg | INHALATION_SOLUTION | RESPIRATORY_TRACT | Status: DC | PRN
  Administered 2015-06-11: 2.5 mg via RESPIRATORY_TRACT
  Filled 2015-06-11: qty 3

## 2015-06-11 MED ORDER — FUROSEMIDE 10 MG/ML IJ SOLN
40.0000 mg | Freq: Once | INTRAMUSCULAR | Status: AC
  Administered 2015-06-11: 40 mg via INTRAVENOUS
  Filled 2015-06-11: qty 4

## 2015-06-11 MED ORDER — VANCOMYCIN HCL 10 G IV SOLR
1250.0000 mg | Freq: Three times a day (TID) | INTRAVENOUS | Status: DC
  Administered 2015-06-11 – 2015-06-15 (×11): 1250 mg via INTRAVENOUS
  Filled 2015-06-11 (×12): qty 1250
  Filled 2015-06-11: qty 1000

## 2015-06-11 MED ORDER — DEXTROSE 5 % IV SOLN
500.0000 mg | INTRAVENOUS | Status: DC
  Administered 2015-06-11 – 2015-06-14 (×4): 500 mg via INTRAVENOUS
  Filled 2015-06-11 (×6): qty 500

## 2015-06-11 NOTE — Progress Notes (Addendum)
Pt up to bathroom, o2 sats down into high 70's, back up to mid 80's, staying around that range. 50% venturi mask placed on pt and sats up to 87, paged for breathing treatment, RT came and gave breathing treatments, lung sounds clear upon auscultation. Pt o2 sats back up into low-mid 90's. Pt resting, states he "feels better" than before. Will continue to monitor closely.   Janell Quiet, RN

## 2015-06-11 NOTE — Progress Notes (Addendum)
Patient ID: Bill Valdez, male   DOB: 10-31-1960, 55 y.o.   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 Julien Nordmann who presented to [facility] with reports of subjective fevers, nonproductive cough. He was found to be septic on the admission and was started on broad-spectrum antibiotics for possible pneumonia.  Barrier to discharge: ongoing cough and dropping O2 sat for which reason we repeated CXR this am.  Assessment/Plan:    Principal problem: Sepsis secondary to right lobar pneumonia / healthcare associated pneumonia / leukopenia / Leukocytosis / Acute interstitial edema  - Sepsis criteria met on the admission with source of infection pneumonia. - Chest x-ray on the admission showed patchy opacity at the left lung base, possible pneumonia or new metastatic disease. CXR also showed right perihilar mass and right basilar opacity with pleural effusion which could be a combination of posttreatment changes as well as pneumonia. - Pt started on aztreonam and vanco - He is still coughing and now oxygen saturation is dropping for which  reason we repeated CXR today and it demonstrated worsening diffuse interstitial prominence throughout the lungs, right greater than left which could represent edema or pneumonia.  - Will give one dose lasix 40 mg IV. - Blood cultures and respiratory cultures so far negative Strep pneumoniae negative. - Continue tussionex scheduled twice daily,  guaifenesin-codeine every 4 hours as needed and we added Tessalon capsule twice daily.   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  - Pt received palliative radiotherapy to the large right upper lobe lung mass followed by treatment with BMS 568 (nivolumab/ipilumumab) at [facility], discontinued recently secondary to disease progression. - Pt on systemic chemotherapy with cisplatin and gemcitabine  - Follows with Dr. 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  Family Communication:  plan of care discussed with the patient Disposition Plan: Home once respiratory status stable, anticipate by 06/14/2015  IV access:  Peripheral IV  Procedures and diagnostic studies:    Dg Chest Portable 1 View 06/07/2015  1. Volume loss in the right hemithorax with right perihilar mass, right basilar opacity and pleural effusion. No interval exams since staging PET-CT, and findings may reflect a combination of post treatment related change, atelectasis or collapse, or pneumonia. 2. Patchy opacity at the left lung base is nonspecific, pneumonia is favored, however new metastatic disease not excluded radiographically.   Dg Chest Port 1 View 06/11/2015  Worsening diffuse interstitial prominence throughout the lungs, right greater than left. This could represent edema or pneumonia. Focal left lower lobe opacity again noted, stable. Enlarging moderate right pleural effusion.    Medical Consultants:  None  Other Consultants:  None  IAnti-Infectives:   Aztreonam and vancomycin 06/07/2015 -->   Travelle Mcclimans, MD  [facility] Pager  [phone]  Time spent in minutes: 25  minutes  If 7PM-7AM, please contact night-coverage [email or web address] Password [facility] 06/11/2015, 12:33 PM   LOS: 4 days    HPI/Subjective: No acute overnight events. Patient reports cough better with cough syrup.   Objective: Filed Vitals:   06/11/15 0505 06/11/15 0805 06/11/15 0811 06/11/15 0812  BP: 164/83     Pulse: 107     Temp: 98.2 F (36.8 C)     TempSrc: Axillary     Resp: 22     Height:      Weight:      SpO2: 90% 81% 89% 91%    Intake/Output Summary (Last 24 hours) at 06/11/15 1233 Last data filed at 06/11/15 0700  Gross per 24 hour  Intake 668.17 ml  Output    750 ml  Net -81.83 ml    Exam:   General:  Pt is not in distress  Cardiovascular: Rate controlled, (+)S1, S2   Respiratory: Coarse breath sounds, no wheezing   Abdomen: (+) BS, non tender   Extremities: No edema, palpable pulses    Neuro: Nonfocal   Data Reviewed: Basic Metabolic Panel:  Recent Labs Lab 06/07/15 2030 06/08/15 0425 06/08/15 1602 06/09/15 0404 06/11/15 0410  NA 125* 126* 125* 126* 127*  K 4.1 4.1 3.7 3.7 3.7  CL 89* 93* 93* 93* 92*  CO2 26 25 26 25 27   GLUCOSE 146* 117* 129* 95 128*  BUN 16 13 12 11 9   CREATININE 0.49* 0.32* 0.41* 0.33* <0.30*  CALCIUM 8.3* 8.1* 7.9* 8.0* 7.8*   Liver Function Tests:  Recent Labs Lab 06/07/15 2030  AST 45*  ALT 28  ALKPHOS 146*  BILITOT 1.2  PROT 5.8*  ALBUMIN 2.0*   No results for input(s): LIPASE, AMYLASE in the last 168 hours. No results for input(s): AMMONIA in the last 168 hours. CBC:  Recent Labs Lab 06/07/15 2030 06/08/15 0425 06/09/15 0404 06/11/15 0410  WBC 3.6* 4.1 7.6 19.8*  NEUTROABS 3.2 3.5  --   --   HGB 9.4* 8.2* 8.5* 8.9*  HCT 28.2* 24.5* 26.0* 26.9*  MCV 83.7 83.6 83.9 84.1  PLT 84* 62* 62* 199   Cardiac Enzymes: No results for input(s): CKTOTAL, CKMB, CKMBINDEX, TROPONINI in the last 168 hours. BNP: Invalid input(s): POCBNP CBG: No results for input(s): GLUCAP in the last 168  hours.  Recent Results (from the past 240 hour(s))  Blood Culture (routine x 2)     Status: None (Preliminary result)   Collection Time: 06/07/15  9:45 PM  Result Value Ref Range Status   Specimen Description BLOOD PORTA CATH  Final   Special Requests BOTTLES DRAWN AEROBIC AND ANAEROBIC 5 ML  Final   Culture   Final    NO GROWTH 2 DAYS Performed at [facility]    Report Status PENDING  Incomplete  Blood Culture (routine x 2)     Status: None (Preliminary result)   Collection Time: 06/07/15  9:46 PM  Result Value Ref Range Status   Specimen Description BLOOD RIGHT ANTECUBITAL  Final   Special Requests BOTTLES DRAWN AEROBIC AND ANAEROBIC 5 ML  Final   Culture   Final    NO GROWTH 2 DAYS Performed at [facility]    Report Status PENDING  Incomplete  Urine culture     Status: None   Collection Time: 06/08/15  2:10 AM  Result Value Ref Range Status   Specimen Description URINE, CLEAN CATCH  Final   Special  Requests NONE  Final   Culture   Final    NO GROWTH 1 DAY Performed at [facility]    Report Status 06/09/2015 FINAL  Final  Culture, respiratory (NON-Expectorated)     Status: None   Collection Time: 06/08/15 10:20 AM  Result Value Ref Range Status   Specimen Description SPUTUM  Final   Special Requests NONE  Final   Gram Stain   Final    ABUNDANT WBC PRESENT,BOTH PMN AND MONONUCLEAR FEW SQUAMOUS EPITHELIAL CELLS PRESENT ABUNDANT GRAM VARIABLE ROD ABUNDANT GRAM POSITIVE COCCI IN PAIRS    Culture   Final    NORMAL OROPHARYNGEAL FLORA Performed at [facility]    Report Status 06/10/2015 FINAL  Final  Culture, expectorated sputum-assessment     Status: None   Collection Time: 06/08/15 10:20 AM  Result Value Ref Range Status   Specimen Description SPUTUM  Final   Special Requests NONE  Final   Sputum evaluation   Final    THIS SPECIMEN IS ACCEPTABLE. RESPIRATORY CULTURE REPORT TO FOLLOW.   Report Status 06/08/2015 FINAL  Final      Scheduled Meds: . aztreonam  2 g Intravenous 3 times per day  . benzonatate  100 mg Oral BID  . chlorpheniramine-HYDROcodone  10 mL Oral Q12H  . feeding supplement (ENSURE ENLIVE)  237 mL Oral BID BM  . senna-docusate  1 tablet Oral BID  . sodium chloride  10-40 mL Intracatheter Q12H  . vancomycin  1,000 mg Intravenous Q8H   Continuous Infusions: . sodium chloride 10 mL/hr at 06/10/15 0946

## 2015-06-11 NOTE — Progress Notes (Signed)
Rx Brief Antibiotic note:  IV Vancomycin  See 1/6 note by A Lilliston for full note  Assessment:  2000 VT=11 mg/L after 1Gm q8h @ [facility]  SCr stable  Below goal of 15-20 mg/L  Plan:  Increase Vancomycin '1250mg'$  IV q8h  F/u SCr/cultures/levels as needed  Dorrene German 06/11/2015 9:03 PM

## 2015-06-11 NOTE — Progress Notes (Signed)
Pharmacy Antibiotic Follow-up Note  Bill Valdez is a 55 y.o. year-old male admitted on 06/07/2015.  The patient is currently on day 5 of Vancomycin and Aztreonam for PNA.  Patient is not clinically improving with continued drop in O2 sats.  Repeat CXR 1/6 shows clinically worse PNA.  Cx data negative.  Renal fxn stable.   Assessment/Plan: Discussed with Dr Charlies Silvers,  Continue Vancomycin & Aztreonam Add atypical coverage with Zithromax '500mg'$  IV q24h Recheck Vancomycin trough prior to next dose.   Temp (24hrs), Avg:98.2 F (36.8 C), Min:98.1 F (36.7 C), Max:98.2 F (36.8 C)   Recent Labs Lab 06/07/15 2030 06/08/15 0425 06/09/15 0404 06/11/15 0410  WBC 3.6* 4.1 7.6 19.8*    Recent Labs Lab 06/07/15 2030 06/08/15 0425 06/08/15 1602 06/09/15 0404 06/11/15 0410  CREATININE 0.49* 0.32* 0.41* 0.33* <0.30*   CrCl cannot be calculated (Patient has no serum creatinine result on file.).    Allergies  Allergen Reactions  . Penicillins Other (See Comments)    Childhood Has patient had a PCN reaction causing immediate rash, facial/tongue/throat swelling, SOB or lightheadedness with hypotension: No. Has patient had a PCN reaction causing severe rash involving mucus membranes or skin necrosis: No Has patient had a PCN reaction that required hospitalization No Has patient had a PCN reaction occurring within the last 10 years: No If all of the above answers are "NO", then may proceed with Cephalospor    Antimicrobials this admission: 1/2 >> Vanc >> 1/3 >> Azactam >> 1/6 >>Azithromycin>>  Levels/dose changes this admission: 1/5 0600: VT__8_on '750mg'$  iv q8hr--prior doses charted correctly. 1/6 1930: VT ___ on 1gm IV q8h (missed 12n dose on 1/5)   Microbiology results: 1/2 bloodx2: NGTD 1/2 urine: NGF 1/3 sputum : NGF 1/3 Strep pneumo antigen negative  Thank you for allowing pharmacy to be a part of this patient's care.  Biagio Borg PharmD 06/11/2015 3:13  PM

## 2015-06-11 NOTE — Care Management Note (Signed)
Case Management Note  Patient Details  Name: Joshua Soulier MRN: [id number] Date of Birth: 06/26/1960  Subjective/Objective: Noted on Venti-mask. Will monitor if home 02 needed-if qualifies can arrange w/orders.[facility] dme rep aware to follow.                   Action/Plan:d/c plan home.   Expected Discharge Date:   (unknown)               Expected Discharge Plan:  Home/Self Care  In-House Referral:     Discharge planning Services  CM Consult  Post Acute Care Choice:    Choice offered to:     DME Arranged:    DME Agency:     HH Arranged:    HH Agency:     Status of Service:  In process, will continue to follow  Medicare Important Message Given:    Date Medicare IM Given:    Medicare IM give by:    Date Additional Medicare IM Given:    Additional Medicare Important Message give by:     If discussed at [place] of Stay Meetings, dates discussed:    Additional Comments:  Dessa Phi, RN 06/11/2015, 3:16 PM

## 2015-06-12 ENCOUNTER — Inpatient Hospital Stay (HOSPITAL_COMMUNITY): Payer: BLUE CROSS/BLUE SHIELD

## 2015-06-12 ENCOUNTER — Encounter (HOSPITAL_COMMUNITY): Payer: Self-pay | Admitting: Internal Medicine

## 2015-06-12 DIAGNOSIS — E876 Hypokalemia: Secondary | ICD-10-CM | POA: Diagnosis not present

## 2015-06-12 DIAGNOSIS — R600 Localized edema: Secondary | ICD-10-CM | POA: Diagnosis present

## 2015-06-12 DIAGNOSIS — R609 Edema, unspecified: Secondary | ICD-10-CM | POA: Diagnosis not present

## 2015-06-12 DIAGNOSIS — J9601 Acute respiratory failure with hypoxia: Secondary | ICD-10-CM | POA: Diagnosis present

## 2015-06-12 DIAGNOSIS — D72829 Elevated white blood cell count, unspecified: Secondary | ICD-10-CM | POA: Diagnosis not present

## 2015-06-12 DIAGNOSIS — J90 Pleural effusion, not elsewhere classified: Secondary | ICD-10-CM | POA: Diagnosis not present

## 2015-06-12 DIAGNOSIS — I871 Compression of vein: Secondary | ICD-10-CM | POA: Diagnosis present

## 2015-06-12 DIAGNOSIS — D638 Anemia in other chronic diseases classified elsewhere: Secondary | ICD-10-CM

## 2015-06-12 DIAGNOSIS — D72819 Decreased white blood cell count, unspecified: Secondary | ICD-10-CM | POA: Diagnosis present

## 2015-06-12 DIAGNOSIS — E43 Unspecified severe protein-calorie malnutrition: Secondary | ICD-10-CM | POA: Diagnosis present

## 2015-06-12 LAB — BASIC METABOLIC PANEL
ANION GAP: 7 (ref 5–15)
BUN: 8 mg/dL (ref 6–20)
CO2: 29 mmol/L (ref 22–32)
Calcium: 7.9 mg/dL — ABNORMAL LOW (ref 8.9–10.3)
Chloride: 92 mmol/L — ABNORMAL LOW (ref 101–111)
Creatinine, Ser: 0.37 mg/dL — ABNORMAL LOW (ref 0.61–1.24)
Glucose, Bld: 107 mg/dL — ABNORMAL HIGH (ref 65–99)
POTASSIUM: 3.1 mmol/L — AB (ref 3.5–5.1)
SODIUM: 128 mmol/L — AB (ref 135–145)

## 2015-06-12 LAB — BODY FLUID CELL COUNT WITH DIFFERENTIAL
Eos, Fluid: 0 %
LYMPHS FL: 23 %
Monocyte-Macrophage-Serous Fluid: 5 % — ABNORMAL LOW (ref 50–90)
Neutrophil Count, Fluid: 72 % — ABNORMAL HIGH (ref 0–25)
Total Nucleated Cell Count, Fluid: 1156 cu mm — ABNORMAL HIGH (ref 0–1000)

## 2015-06-12 LAB — MRSA PCR SCREENING: MRSA BY PCR: NEGATIVE

## 2015-06-12 LAB — CBC
HEMATOCRIT: 27.9 % — AB (ref 39.0–52.0)
Hemoglobin: 9.1 g/dL — ABNORMAL LOW (ref 13.0–17.0)
MCH: 28.1 pg (ref 26.0–34.0)
MCHC: 32.6 g/dL (ref 30.0–36.0)
MCV: 86.1 fL (ref 78.0–100.0)
Platelets: 275 10*3/uL (ref 150–400)
RBC: 3.24 MIL/uL — AB (ref 4.22–5.81)
RDW: 15.8 % — AB (ref 11.5–15.5)
WBC: 28.7 10*3/uL — AB (ref 4.0–10.5)

## 2015-06-12 MED ORDER — LORAZEPAM 2 MG/ML IJ SOLN
1.0000 mg | INTRAMUSCULAR | Status: DC | PRN
  Administered 2015-06-12 – 2015-06-15 (×7): 1 mg via INTRAVENOUS
  Filled 2015-06-12 (×7): qty 1

## 2015-06-12 MED ORDER — POTASSIUM CHLORIDE CRYS ER 20 MEQ PO TBCR
40.0000 meq | EXTENDED_RELEASE_TABLET | Freq: Once | ORAL | Status: AC
  Administered 2015-06-12: 40 meq via ORAL
  Filled 2015-06-12: qty 2

## 2015-06-12 MED ORDER — SODIUM CHLORIDE 0.9 % IV SOLN
1.0000 mg/h | INTRAVENOUS | Status: DC
  Filled 2015-06-12: qty 10

## 2015-06-12 MED ORDER — FUROSEMIDE 10 MG/ML IJ SOLN
40.0000 mg | Freq: Once | INTRAMUSCULAR | Status: AC
  Administered 2015-06-12: 40 mg via INTRAVENOUS
  Filled 2015-06-12: qty 4

## 2015-06-12 MED ORDER — MORPHINE SULFATE (PF) 2 MG/ML IV SOLN
1.0000 mg | INTRAVENOUS | Status: DC | PRN
  Administered 2015-06-12 – 2015-06-18 (×18): 1 mg via INTRAVENOUS
  Filled 2015-06-12 (×19): qty 1

## 2015-06-12 NOTE — Progress Notes (Signed)
Chaplain visit the result of a staff referral.  Ms Gilkes has gone through a stressful medical situation requiring a rapid response team. His family were at his bedside providing active care and support. He wished to speak, but family discouraged such. The family are regular and committed congregants of [place]. Should the family wish for a priest to be called for any reason, please page the chaplain immediately.  Chaplain follow-up as the opportunity arises.  Sallee Lange. Torrance Stockley, DMin, MDiv, MA Chaplain

## 2015-06-12 NOTE — Progress Notes (Addendum)
Patient in [place] for thoracentesis when rapid response called. RN responded to rapid response, patient with difficulty breathing. Per pt wants to be DNR.  Primary physician made aware and orders changed.  Patient transferred to step down per MD order.

## 2015-06-12 NOTE — Progress Notes (Signed)
Patient was assessed and transferred to his new room in stable condition. Report was called prior to his arrival.

## 2015-06-12 NOTE — Progress Notes (Addendum)
Patient ID: Bill Valdez, male   DOB: 1961-01-01, 55 y.o.   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 He was found to be septic on the admission and was started on broad-spectrum antibiotics for possible pneumonia. Hospital course is complicated with ongoing cough and hypoxia for which reason he is now on VM to keep O2 sats above 90%.   Assessment/Plan:    Principal problem:   Acute respiratory failure with hypoxia ([place]) / Sepsis due to pneumonia [facility]) / healthcare associated pneumonia / Leukopenia / Leukocytosis - Sepsis criteria met on the admission. Source of infection presumed to be pneumonia. - Ongoing cough since admission so we repeated CXR 06/11/2015 - it showed worsening diffuse interstitial disease right lung greater than left, edema or pneumonia, enlarging moderate right pleural effusion. He was given lasix 40 mg IV 1/6 and will give 1 dose today 40 mg IV - Order placed for US thoracentesis - Continue current abx which include vanco, aztreonam and we added azithromycin 1/6 - Continue antitussives as needed  - Blood cultures and respiratory cultures so far negative Strep pneumoniae negative.    Active problems: Interstitial edema / Pleural effusion, right - Has gotten lasix 40 mg IV 1/6 and 1/7 - Order placed for US thoracentesis with fluid analysis including cytology   Cancer of upper lobe of right lung ([place]) / Brain metastasis ([place]) / Bone metastasis ([place]) / Edema of upper extremity due to SVC syndrome  - Pt presented with right upper lobe suprahilar mass with  questionable SVC syndrome in addition to mediastinal lymphadenopathy with extensive bone and liver metastases in September 2016.  - Has received palliative radiotherapy to the large right upper lobe lung mass followed by treatment with BMS 568 (nivolumab/ipilumumab) at [place], discontinued recently secondary to disease progression. 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  Family Communication:  plan of care discussed with the patient and his wife at the bedside  Disposition Plan: unable to predict at this time when he will be going home, he has high oxygen requirement and is not stable for discahrge at this time and will likely not be ready before mid week next week  IV access:  Peripheral IV  Procedures and diagnostic studies:    Dg Chest Portable 1 View 06/07/2015  1. Volume loss in the right hemithorax with right perihilar mass, right basilar opacity and pleural effusion. No interval exams since staging PET-CT, and findings may reflect a combination of post treatment related change, atelectasis or collapse, or pneumonia. 2. Patchy opacity at the left lung base is nonspecific, pneumonia is favored, however new metastatic disease not excluded radiographically.   Dg Chest [facility]  1 View 06/11/2015  Worsening diffuse interstitial prominence throughout the lungs, right greater than left. This could represent edema or pneumonia. Focal left lower lobe opacity again noted, stable. Enlarging moderate right  pleural effusion.   Medical Consultants:  None  Other Consultants:  Nutrition   IAnti-Infectives:   Aztreonam and vancomycin 06/07/2015 --> Azithromycin 06/11/2015 -->   Leisa Lenz, MD  [facility] Pager [phone]  Time spent in minutes: 25 minutes  If 7PM-7AM, please contact night-coverage [email or web address] Password [email or web address] 06/12/2015, 9:25 AM   LOS: 5 days    HPI/Subjective: No acute overnight events. Coughing.   Objective: Filed Vitals:   06/11/15 0812 06/11/15 1408 06/11/15 2124 06/12/15 0201  BP:  107/63 113/73 111/81  Pulse:  108 108 106  Temp:  98.1 F (36.7 C) 99 F (37.2 C) 98.4 F (36.9 C)  TempSrc:  Axillary Oral Oral  Resp:  24 22 20   Height:      Weight:      SpO2: 91% 94% 94% 95%    Intake/Output Summary (Last 24 hours) at 06/12/15 0925 Last data filed at 06/11/15 2300  Gross per 24 hour  Intake    690 ml  Output   1275 ml  Net   -585 ml    Exam:   General:  Pt is awake, no distress  Cardiovascular: Rate controlled, appreciate S1, S2   Respiratory: diminished and coarse breath sounds   Abdomen: non tender, non distended, (+) BS   Extremities: UE edema left more than right, no LE edema, palpable pulses   Neuro: No focal deficits   Data Reviewed: Basic Metabolic Panel:  Recent Labs Lab 06/08/15 0425 06/08/15 1602 06/09/15 0404 06/11/15 0410 06/12/15 0500  NA 126* 125* 126* 127* 128*  K 4.1 3.7 3.7 3.7 3.1*  CL 93* 93* 93* 92* 92*  CO2 25 26 25 27 29   GLUCOSE 117* 129* 95 128* 107*  BUN 13 12 11 9 8   CREATININE 0.32* 0.41* 0.33* <0.30* 0.37*  CALCIUM 8.1* 7.9* 8.0* 7.8* 7.9*   Liver Function Tests:  Recent Labs Lab 06/07/15 2030  AST 45*  ALT 28  ALKPHOS 146*  BILITOT 1.2  PROT 5.8*  ALBUMIN 2.0*   No results for input(s): LIPASE, AMYLASE in the last 168 hours. No results for input(s): AMMONIA in the last 168 hours. CBC:  Recent Labs Lab 06/07/15 2030 06/08/15 0425 06/09/15 0404 06/11/15 0410  06/12/15 0500  WBC 3.6* 4.1 7.6 19.8* 28.7*  NEUTROABS 3.2 3.5  --   --   --   HGB 9.4* 8.2* 8.5* 8.9* 9.1*  HCT 28.2* 24.5* 26.0* 26.9* 27.9*  MCV 83.7 83.6 83.9 84.1 86.1  PLT 84* 62* 62* 199 275   Cardiac Enzymes: No results for input(s): CKTOTAL, CKMB, CKMBINDEX, TROPONINI in the last 168 hours. BNP: Invalid input(s): POCBNP CBG: No results for input(s): GLUCAP in the last 168 hours.  Recent Results (from the past 240 hour(s))  Blood Culture (routine x 2)     Status: None (Preliminary result)   Collection Time: 06/07/15  9:45 PM  Result Value Ref Range Status   Specimen Description BLOOD PORTA CATH  Final   Special Requests BOTTLES DRAWN AEROBIC AND ANAEROBIC 5 ML  Final   Culture   Final    NO GROWTH 3 DAYS Performed at [facility]    Report Status PENDING  Incomplete  Blood Culture (routine x 2)     Status: None (Preliminary result)   Collection Time: 06/07/15  9:46 PM  Result Value Ref Range Status   Specimen Description BLOOD RIGHT ANTECUBITAL  Final   Special Requests BOTTLES DRAWN AEROBIC AND ANAEROBIC 5 ML  Final   Culture   Final    NO GROWTH 3 DAYS Performed at [facility]    Report Status PENDING  Incomplete  Urine culture     Status: None   Collection Time: 06/08/15  2:10 AM  Result Value Ref Range Status   Specimen Description URINE, CLEAN CATCH  Final   Special Requests NONE  Final   Culture   Final    NO GROWTH 1 DAY Performed at [facility]    Report Status 06/09/2015 FINAL  Final  Culture, respiratory (NON-Expectorated)     Status: None   Collection Time: 06/08/15 10:20 AM  Result Value Ref Range Status   Specimen Description SPUTUM  Final   Special Requests NONE  Final   Gram Stain   Final    ABUNDANT WBC PRESENT,BOTH PMN AND MONONUCLEAR FEW SQUAMOUS EPITHELIAL CELLS PRESENT ABUNDANT GRAM VARIABLE ROD ABUNDANT GRAM POSITIVE COCCI IN PAIRS    Culture   Final    NORMAL OROPHARYNGEAL FLORA Performed at [facility]    Report Status 06/10/2015 FINAL  Final  Culture, expectorated sputum-assessment     Status: None   Collection Time: 06/08/15 10:20 AM  Result Value Ref Range Status   Specimen Description SPUTUM  Final   Special Requests NONE  Final   Sputum evaluation   Final    THIS SPECIMEN IS ACCEPTABLE. RESPIRATORY CULTURE REPORT TO FOLLOW.   Report Status 06/08/2015 FINAL  Final     Scheduled Meds: . azithromycin  500 mg Intravenous Q24H  . aztreonam  2 g Intravenous 3 times per day  . benzonatate  100 mg Oral BID  . chlorpheniramine-HYDROcodone  10 mL Oral Q12H  . feeding supplement (ENSURE ENLIVE)  237 mL Oral BID BM  . senna-docusate  1 tablet Oral BID  . sodium chloride  10-40 mL Intracatheter Q12H  . vancomycin  1,250 mg Intravenous Q8H   Continuous Infusions: . sodium chloride 10 mL/hr at 06/10/15 0946

## 2015-06-12 NOTE — ED Provider Notes (Signed)
I evaluated the patient in the radiology department for a call for rapid response team. The patient was getting an elective thoracentesis by interventional radiologist. After completing procedure the patient became very dyspneic and had significant oxygen saturation.  Upon arrival patient was seated in an upright position with a nonrebreather mask in place. He did have cyanotic appearance but was awake and answering questions appropriately. On nonrebreather mask patient's oxygen saturation was correlating at 65%.  Patient did have coarse crackles in both lung fields. With air flow present.   Heart rate was tachycardic.   Stat portable chest was obtained in the radiology department. I reviewed that and there was no pneumothorax present. Patient has some residual pleural effusion and significant pulmonary edema in both lungs. The radiologist on site reported that the pleural effusion was improved relative to preprocedure.   The patient advised that he did not wish intubation. He is a DO NOT RESUSCITATE. Patient did accept the application of BiPAP. Given a verbal order to have respiratory applied BiPAP. Nursing staff is paging Dr. Rogers Blocker to tinea the patient's management. At this time, with the patient being a DO NOT RESUSCITATE and not wishing for intubation, I will leave the remainder of care to the internal medicine team. There is no indication this time for chest tube or other temporizing immediate procedure other than the application of BiPAP.  Charlesetta Shanks, MD 06/12/15 [phone]

## 2015-06-12 NOTE — Procedures (Signed)
Right thoracentesis 1200 cc clear fluid No comp/EBL

## 2015-06-12 NOTE — Progress Notes (Signed)
Change in respiratory status Pt DNR but still agreeable to BiPAP Transfer to SDU Morphine 1 mg every 2 hours PRN for breathing Leisa Lenz [facility] [phone]

## 2015-06-13 LAB — GRAM STAIN

## 2015-06-13 LAB — CULTURE, BLOOD (ROUTINE X 2)
CULTURE: NO GROWTH
Culture: NO GROWTH

## 2015-06-13 NOTE — Progress Notes (Signed)
Patient ID: Bill Valdez, male   DOB: [birth year], 55 y.o.   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 Julien Nordmann who presented to [facility] with reports of subjective fevers, nonproductive cough. He was found to be septic on the admission and was started on broad-spectrum antibiotics for possible pneumonia.  Hospital course is complicated with ongoing hypoxia and currently need for BiPAP to keep oxygen saturation above 90%. Patient and his family chose DNR/DNI CODE STATUS as well as symptom control with morphine and Ativan as needed.  Assessment/Plan:    Principal problem:   Acute respiratory failure with hypoxia ([place]) / Sepsis due to pneumonia [facility]) / healthcare associated pneumonia / Leukopenia / Leukocytosis - Sepsis criteria met on the admission. Source of infection presumed to be pneumonia. - Patient still hypoxic and currently requires BiPAP to keep oxygen saturation above 90% - Off note, patient requires Ventimask until 06/12/2015 when he quickly deteriorated after thoracentesis and from that point he was transferred to stepdown unit because he required BiPAP - Patient had thoracentesis on 06/12/2015 with 1.2 L fluid removed - Post thoracentesis chest x-ray showed no pneumothorax - We will continue current antibiotics, azithromycin, aztreonam and vancomycin - We will control the symptoms with morphine and Ativan as needed - We will continue to monitor in step down unit because he requires BiPAP - Blood cultures and respiratory cultures so far negative Strep pneumoniae negative.    Active problems: Interstitial edema / Pleural effusion, right - Has gotten lasix 40 mg  IV 1/6 and 1/7 - Status post thoracentesis 06/12/2015 with 1.2 L fluid removed  Cancer of upper lobe of right lung ([facility]) / Brain metastasis ([facility]) / Bone metastasis ([place]) / Edema of upper extremity due to SVC syndrome  - Pt presented with right upper lobe suprahilar mass with questionable SVC syndrome in addition to mediastinal lymphadenopathy with extensive bone and liver metastases in September 2016.  - Has received palliative radiotherapy to the large right upper lobe lung mass followed by treatment with BMS 568 (nivolumab/ipilumumab) at [place], discontinued recently secondary to disease progression. - Pt on systemic chemotherapy with cisplatin and gemcitabine  - Follows with Dr. 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  DVT Prophylaxis  - SCD's in hospital   Code Status: DNR/DNI Family Communication:  plan of care discussed with the patient and his brother and sister at the bedside Disposition Plan: Remains in step down unit because he requires BiPAP  IV access:  Peripheral IV  Procedures and diagnostic studies:    Dg Chest 1 View 06/12/2015   No pneumothorax post thoracentesis.  US Thoracentesis Asp Pleural Space W/img Guide 06/12/2015   Successful ultrasound guided right thoracentesis yielding 1200 cc of pleural fluid. After the procedure was performed, just prior to transfer of  the patient back to the floor, the patient became tachypneic. His pulse ox saturation droped to 70 percent. [place] became 169 with of  systolic blood pressure of 114. The blood pressure was stable. He complained of dyspnea but remain did lucid throughout this episode. Rapid response was called in preparation for endotracheal intubation. The patient and family expressed wishes of do not resuscitate and refused endotracheal intubation. BiPAP support was instituted, then the patient was transferred to the ICU. Electronically Signed   By: Marybelle Killings M.D.   On: 06/12/2015 14:58   Dg Chest Portable 1 View 06/07/2015  1. Volume loss in the right hemithorax with right perihilar mass, right basilar opacity and pleural effusion. No interval exams since staging PET-CT, and findings may reflect a combination of post treatment related change, atelectasis or collapse, or pneumonia. 2. Patchy opacity at the left lung base is nonspecific, pneumonia is favored, however new metastatic disease not excluded radiographically.   Dg Chest Port 1 View 06/11/2015  Worsening diffuse interstitial prominence throughout the lungs, right greater than left. This could represent edema or pneumonia. Focal left lower lobe opacity again noted, stable. Enlarging moderate right pleural effusion.   Medical Consultants:  None  Other Consultants:  Nutrition   IAnti-Infectives:   Aztreonam and vancomycin 06/07/2015 --> Azithromycin 06/11/2015 -->   Leisa Lenz, MD  [facility] Pager [phone]  Time spent in minutes: 25 minutes  If 7PM-7AM, please contact night-coverage [email or web address] Password [email or web address] 06/13/2015, 10:13 AM   LOS: 6 days    HPI/Subjective: No acute overnight events. On BiPAP.  Objective: Filed Vitals:   06/13/15 0747 06/13/15 0800 06/13/15 0816 06/13/15 0900  BP: 113/61 110/65    Pulse: 109 108  108  Temp:   97.3 F (36.3 C)   TempSrc:   Axillary   Resp: 29 28  23   Height:      Weight:      SpO2: 99% 98%  100%    Intake/Output Summary (Last 24 hours) at 06/13/15 1013 Last data filed at 06/13/15 0900  Gross per 24 hour  Intake    1240 ml  Output   1550 ml  Net   -310 ml    Exam:   General:  Patient lethargic, on BiPAP  Cardiovascular: Tachycardic, appreciate S1-S2  Respiratory: diminished breath sounds, no wheezing  Abdomen: Appreciate bowel sounds, nontender abdomen  Extremities: UE swelling bilaterally, no lower extremity swelling  Neuro: Nonfocal  Data Reviewed: Basic Metabolic Panel:  Recent Labs Lab 06/08/15 0425 06/08/15 1602 06/09/15 0404 06/11/15 0410 06/12/15 0500  NA 126* 125* 126* 127* 128*  K 4.1 3.7 3.7 3.7 3.1*  CL 93* 93* 93* 92* 92*  CO2 25 26 25 27 29   GLUCOSE 117* 129* 95 128* 107*  BUN 13 12 11 9 8   CREATININE 0.32* 0.41* 0.33* <0.30* 0.37*  CALCIUM 8.1* 7.9* 8.0* 7.8* 7.9*   Liver Function Tests:  Recent Labs Lab 06/07/15 2030  AST 45*  ALT 28  ALKPHOS 146*  BILITOT 1.2  PROT 5.8*  ALBUMIN 2.0*   No results for input(s): LIPASE, AMYLASE in the last 168 hours. No results for input(s): AMMONIA in the last 168 hours. CBC:  Recent Labs Lab 06/07/15 2030 06/08/15 0425 06/09/15 0404 06/11/15 0410 06/12/15 0500  WBC 3.6* 4.1 7.6 19.8* 28.7*  NEUTROABS 3.2 3.5  --   --   --   HGB 9.4* 8.2* 8.5* 8.9* 9.1*  HCT 28.2* 24.5* 26.0* 26.9* 27.9*  MCV 83.7 83.6 83.9  84.1 86.1  PLT 84* 62* 62* 199 275   Cardiac Enzymes: No results for input(s): CKTOTAL, CKMB, CKMBINDEX, TROPONINI in the last 168 hours. BNP: Invalid input(s): POCBNP CBG: No results for input(s): GLUCAP in the last 168 hours.  Recent Results (from the past 240 hour(s))  Blood Culture (routine x 2)     Status: None (Preliminary result)   Collection Time: 06/07/15  9:45 PM  Result Value Ref Range Status   Specimen Description BLOOD PORTA CATH  Final   Special Requests BOTTLES DRAWN AEROBIC AND ANAEROBIC 5 ML  Final   Culture   Final    NO GROWTH 4 DAYS Performed at [facility]    Report Status PENDING  Incomplete  Blood Culture (routine x 2)     Status: None (Preliminary result)    Collection Time: 06/07/15  9:46 PM  Result Value Ref Range Status   Specimen Description BLOOD RIGHT ANTECUBITAL  Final   Special Requests BOTTLES DRAWN AEROBIC AND ANAEROBIC 5 ML  Final   Culture   Final    NO GROWTH 4 DAYS Performed at [facility]    Report Status PENDING  Incomplete  Urine culture     Status: None   Collection Time: 06/08/15  2:10 AM  Result Value Ref Range Status   Specimen Description URINE, CLEAN CATCH  Final   Special Requests NONE  Final   Culture   Final    NO GROWTH 1 DAY Performed at [facility]    Report Status 06/09/2015 FINAL  Final  Culture, respiratory (NON-Expectorated)     Status: None   Collection Time: 06/08/15 10:20 AM  Result Value Ref Range Status   Specimen Description SPUTUM  Final   Special Requests NONE  Final   Gram Stain   Final    ABUNDANT WBC PRESENT,BOTH PMN AND MONONUCLEAR FEW SQUAMOUS EPITHELIAL CELLS PRESENT ABUNDANT GRAM VARIABLE ROD ABUNDANT GRAM POSITIVE COCCI IN PAIRS    Culture   Final    NORMAL OROPHARYNGEAL FLORA Performed at [facility]    Report Status 06/10/2015 FINAL  Final  Culture, expectorated sputum-assessment     Status: None   Collection Time: 06/08/15 10:20 AM  Result Value Ref Range Status   Specimen Description SPUTUM  Final   Special Requests NONE  Final   Sputum evaluation   Final    THIS SPECIMEN IS ACCEPTABLE. RESPIRATORY CULTURE REPORT TO FOLLOW.   Report Status 06/08/2015 FINAL  Final  Culture, body fluid-bottle     Status: None (Preliminary result)   Collection Time: 06/12/15  3:11 PM  Result Value Ref Range Status   Specimen Description FLUID RIGHT PLEURAL  Final   Special Requests IN PEDIATRIC BOTTLE 5CC  Final   Gram Stain   Final    CYTOSPIN SLIDE WBC PRESENT,BOTH PMN AND MONONUCLEAR NO ORGANISMS SEEN Performed at [facility]    Culture PENDING  Incomplete   Report Status PENDING  Incomplete  MRSA PCR Screening     Status: None   Collection  Time: 06/12/15  3:12 PM  Result Value Ref Range Status   MRSA by PCR NEGATIVE NEGATIVE Final    Comment:        The GeneXpert MRSA Assay (FDA approved for NASAL specimens only), is one component of a comprehensive MRSA colonization surveillance program. It is not intended to diagnose MRSA infection nor to guide or monitor treatment for MRSA infections.      Scheduled Meds: .  azithromycin  500 mg Intravenous Q24H  . aztreonam  2 g Intravenous 3 times per day  . benzonatate  100 mg Oral BID  . chlorpheniramine-HYDROcodone  10 mL Oral Q12H  . feeding supplement (ENSURE ENLIVE)  237 mL Oral BID BM  . senna-docusate  1 tablet Oral BID  . sodium chloride  10-40 mL Intracatheter Q12H  . vancomycin  1,250 mg Intravenous Q8H   Continuous Infusions: . sodium chloride 10 mL/hr at 06/13/15 0900  . morphine

## 2015-06-13 NOTE — Progress Notes (Signed)
[facility] notified RN of results for Pleural Gram Stain- "Rare WBC with PMN gram positive cocci in clusters and few gram positive rods". Notified [facility] on call. Will await further orders. Luther Parody, RN

## 2015-06-13 NOTE — Progress Notes (Signed)
Pt currently on NRB and tolerating well at this time.  Pt in no distress at this time, BIPAP not needed at this time.  RT to monitor and assess as needed.

## 2015-06-14 ENCOUNTER — Inpatient Hospital Stay (HOSPITAL_COMMUNITY): Payer: BLUE CROSS/BLUE SHIELD

## 2015-06-14 DIAGNOSIS — J189 Pneumonia, unspecified organism: Secondary | ICD-10-CM

## 2015-06-14 DIAGNOSIS — Z9889 Other specified postprocedural states: Secondary | ICD-10-CM | POA: Diagnosis present

## 2015-06-14 DIAGNOSIS — J441 Chronic obstructive pulmonary disease with (acute) exacerbation: Secondary | ICD-10-CM

## 2015-06-14 DIAGNOSIS — C3490 Malignant neoplasm of unspecified part of unspecified bronchus or lung: Secondary | ICD-10-CM

## 2015-06-14 DIAGNOSIS — J9601 Acute respiratory failure with hypoxia: Secondary | ICD-10-CM

## 2015-06-14 DIAGNOSIS — J948 Other specified pleural conditions: Secondary | ICD-10-CM

## 2015-06-14 DIAGNOSIS — J9600 Acute respiratory failure, unspecified whether with hypoxia or hypercapnia: Secondary | ICD-10-CM

## 2015-06-14 LAB — BASIC METABOLIC PANEL
ANION GAP: 9 (ref 5–15)
BUN: 12 mg/dL (ref 6–20)
CALCIUM: 7.9 mg/dL — AB (ref 8.9–10.3)
CO2: 29 mmol/L (ref 22–32)
CREATININE: 0.34 mg/dL — AB (ref 0.61–1.24)
Chloride: 93 mmol/L — ABNORMAL LOW (ref 101–111)
GFR calc Af Amer: >60 mL/min (ref >60)
GLUCOSE: 100 mg/dL — AB (ref 65–99)
Potassium: 3.5 mmol/L (ref 3.5–5.1)
Sodium: 131 mmol/L — ABNORMAL LOW (ref 135–145)

## 2015-06-14 MED ORDER — ACETYLCYSTEINE 20 % IN SOLN
3.0000 mL | RESPIRATORY_TRACT | Status: AC
  Administered 2015-06-14 – 2015-06-16 (×12): 3 mL via RESPIRATORY_TRACT
  Filled 2015-06-14 (×12): qty 4

## 2015-06-14 MED ORDER — ALBUTEROL SULFATE (2.5 MG/3ML) 0.083% IN NEBU
2.5000 mg | INHALATION_SOLUTION | RESPIRATORY_TRACT | Status: AC
  Administered 2015-06-14 – 2015-06-16 (×12): 2.5 mg via RESPIRATORY_TRACT
  Filled 2015-06-14 (×12): qty 3

## 2015-06-14 NOTE — Progress Notes (Signed)
Pharmacy Antibiotic Follow-up Note  Bill Valdez is a 55 y.o. year-old male admitted on 06/07/2015.  The patient is currently on day #8 of vancomycin/aztreonam, day #4 azithromycin for pneumonia.  On Aztreonam for PCN allergy but this appears to have been a childhood allergy and cephalosporin would be OK based on responses.  Pneumonia is not improving per [facility] notes.  GPC clusters identified in pleural fluid culture.  WBC is worsening per labs (Last checked 1/7) SCr stable with lab this am  Assessment/Plan:  Currently on vancomycin '1250mg'$  IV q8h, check level in am as renal function appears stable  Aztreonam 2gm IV q8h, note cephalosporin would be appropriate in this patient as the PCN allergy is a childhood rxn (non-type I rxn)  Await culture results  Temp (24hrs), Avg:98.2 F (36.8 C), Min:97.7 F (36.5 C), Max:99 F (37.2 C)   Recent Labs Lab 06/07/15 2030 06/08/15 0425 06/09/15 0404 06/11/15 0410 06/12/15 0500  WBC 3.6* 4.1 7.6 19.8* 28.7*    Recent Labs Lab 06/08/15 0425 06/08/15 1602 06/09/15 0404 06/11/15 0410 06/12/15 0500  CREATININE 0.32* 0.41* 0.33* <0.30* 0.37*   Estimated Creatinine Clearance: 109.3 mL/min (by C-G formula based on Cr of 0.37).    Allergies  Allergen Reactions  . Penicillins Other (See Comments)    Childhood Has patient had a PCN reaction causing immediate rash, facial/tongue/throat swelling, SOB or lightheadedness with hypotension: No. Has patient had a PCN reaction causing severe rash involving mucus membranes or skin necrosis: No Has patient had a PCN reaction that required hospitalization No Has patient had a PCN reaction occurring within the last 10 years: No If all of the above answers are "NO", then may proceed with Cephalospor    Antimicrobials this admission: 1/2 >> Vanc >>  1/3 >> Azactam >>  1/6 >> Azithromycin >>  Levels/dose changes this admission: 1/5 0600: VT 8 on '750mg'$  iv q8hr--prior doses charted correctly.  1/6  1930: VT 11 on 1gm IV q8h (missed 12n dose on 1/5)  Microbiology results: 1/2 bloodx2: NGF  1/2 urine: NGF  1/3 sputum: normal flora-final  1/3 Strep pneumo antigen negative  1/7 R pleural fluid: GPC clusters  MRSA PCR neg  Thank you for allowing pharmacy to be a part of this patient's care.  Doreene Eland, PharmD, BCPS.   Pager: [phone] 06/14/2015 10:45 AM

## 2015-06-14 NOTE — Progress Notes (Signed)
Pt currently on NRB.

## 2015-06-14 NOTE — Progress Notes (Signed)
Nutrition Follow-up  DOCUMENTATION CODES:   Non-severe (moderate) malnutrition in context of chronic illness  INTERVENTION:   Continue Ensure Enlive po BID, each supplement provides 350 kcal and 20 grams of protein RD to continue to monitor  NUTRITION DIAGNOSIS:   Malnutrition related to poor appetite, chronic illness as evidenced by percent weight loss, mild depletion of body fat, mild depletion of muscle mass.  Ongoing.  GOAL:   Patient will meet greater than or equal to 90% of their needs  Not meeting.  MONITOR:   PO intake, Supplement acceptance, Labs, I & O's, GOC    ASSESSMENT:   Bill Valdez is a 55 y.o. male w no sig PMH who presented in Sept '16 with wt loss, SOB and chest pain. Dx'd with stage 4 lung cancer and is currently getting chemoRx w oncology here at [facility]. Has liver mets , SVC syndrome, numerous bony mets. Presenting today w SOB of one day's duration. CXR here shows new RLL consolidation/ effusion and patchy LLL infiltrate. Temp is 102 deg, WBC 3. Asked to see for admission for PNA.   Pt with poor intake over the last couple of days d/t lethargy and Bi-Pap use. Pt is drinking supplements. Per oncology note, pt with poor prognosis. Will monitor for GOC.  Labs reviewed: Low Na, Creatinine  Diet Order:  Diet regular Room service appropriate?: Yes; Fluid consistency:: Thin  Skin:  Wound (see comment) (Stage II sacral ulcer)  Last BM:  1/6  Height:   Ht Readings from Last 1 Encounters:  06/12/15 '5\' 11"'$  (1.803 m)    Weight:   Wt Readings from Last 1 Encounters:  06/13/15 161 lb 6 oz (73.2 kg)    Ideal Body Weight:  78.18 kg  BMI:  Body mass index is 22.52 kg/(m^2).  Estimated Nutritional Needs:   Kcal:  2000-2400 calories  Protein:  90-115 grams  Fluid:  >/= 2L  EDUCATION NEEDS:   No education needs identified at this time  Clayton Bibles, MS, RD, LDN Pager: [phone] After Hours Pager: [phone]

## 2015-06-14 NOTE — Progress Notes (Signed)
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  PRIOR THERAPY:  1) Palliative radiotherapy to the large right upper lobe lung mass under the care of Dr. Tammi Klippel. 2) treatment on BMS 568 (nivolumab/ipilumumab) at [place], discontinued recently secondary to disease progression.   CURRENT THERAPY: Systemic chemotherapy with cisplatin 75 MG/M2 on day 1, Gemcitabine 1000 MG/M2 on days 1, 8 and Portrazza 800 mg on days 1 and 8 every 3 weeks. First dose on 05/26/2015. Status post one cycle.  Subjective: The patient is seen and examined today. He continues to have shortness of breath and currently on facemask. He tolerated the first cycle of his systemic chemotherapy fairly well with no significant adverse effects. He denied having any nausea or vomiting. Unfortunately he was admitted recently to [facility] with increasing shortness of breath and questionable pneumonia. Chest x-ray on the day of admission showed patchy opacity in the left lung base questionable for pneumonia. He is currently undergoing treatment with Zithromax and Azactam. On 06/12/2015 the patient underwent ultrasound-guided right thoracentesis with drainage 1200 mL of pleural fluid. After the procedure the patient had open his oxygen saturation down to 70% with tachycardia and heart rate of 140. He refused intubation and the patient was placed on BiPAP support at that time and transferred to the ICU. He denied having any fever or chills today. He has no nausea, vomiting, diarrhea or constipation.  Objective: Vital signs in last 24 hours: Temp:  [97.7 F (36.5 C)-99 F (37.2 C)] 97.7 F (36.5 C) (01/09 0832) Pulse Rate:  [108-116] 109 (01/09 0600) Resp:   [16-33] 22 (01/09 0600) BP: (99-121)/(55-68) 119/65 mmHg (01/09 0600) SpO2:  [93 %-100 %] 99 % (01/09 0600)  Intake/Output from previous day: 01/08 0701 - 01/09 0700 In: 1380 [I.V.:230; IV Piggyback:1150] Out: 325 [Urine:325] Intake/Output this shift:    General appearance: alert, cooperative, fatigued and mild distress Resp: rales bilaterally and wheezes bilaterally Cardio: regular rate and rhythm, S1, S2 normal, no murmur, click, rub or gallop GI: soft, non-tender; bowel sounds normal; no masses,  no organomegaly Extremities: extremities normal, atraumatic, no cyanosis or edema  Lab Results:   Recent Labs  06/12/15 0500  WBC 28.7*  HGB 9.1*  HCT 27.9*  PLT 275   BMET  Recent Labs  06/12/15 0500  NA 128*  K 3.1*  CL 92*  CO2 29  GLUCOSE 107*  BUN 8  CREATININE 0.37*  CALCIUM 7.9*    Studies/Results: Dg Chest 1 View  06/12/2015  CLINICAL DATA:  Status post thoracentesis EXAM: CHEST 1 VIEW COMPARISON:  Yesterday FINDINGS: The right pleural effusion has improved after right thoracentesis. Overall bilateral lung volumes are improved. Heterogeneous opacities throughout both lungs persist and are not significantly changed. Left jugular Port-A-Cath is stable. There is no pneumothorax. IMPRESSION: No pneumothorax post thoracentesis. Electronically Signed   By: Marybelle Killings M.D.   On: 06/12/2015 14:55   US Thoracentesis Asp Pleural Space W/img Guide  06/12/2015  CLINICAL DATA:  Right pleural effusion EXAM: ULTRASOUND GUIDED RIGHT THORACENTESIS COMPARISON:  None. PROCEDURE: An ultrasound guided thoracentesis was thoroughly discussed with the patient and questions answered. The benefits, risks, alternatives and complications were also discussed. The patient understands and wishes to proceed with the procedure. Written consent was obtained. Ultrasound was performed  to localize and mark an adequate pocket of fluid in the right chest. The area was then prepped and draped in the normal  sterile fashion. 1% Lidocaine was used for local anesthesia. Under ultrasound guidance a Safe-T-Centesis catheter was introduced. Thoracentesis was performed. The catheter was removed and a dressing applied. COMPLICATIONS: None. FINDINGS: A total of approximately 1200 cc of clear yellow fluid was removed. A fluid sample was sent for laboratory analysis. IMPRESSION: Successful ultrasound guided right thoracentesis yielding 1200 cc of pleural fluid. After the procedure was performed, just prior to transfer of the patient back to the floor, the patient became tachypneic. His pulse ox saturation droped to 70 percent. Poles became 741 with of systolic blood pressure of 114. The blood pressure was stable. He complained of dyspnea but remain did lucid throughout this episode. Rapid response was called in preparation for endotracheal intubation. The patient and family expressed wishes of do not resuscitate and refused endotracheal intubation. BiPAP support was instituted, then the patient was transferred to the ICU. Electronically Signed   By: Marybelle Killings M.D.   On: 06/12/2015 14:58    Medications: I have reviewed the patient's current medications.  CODE STATUS: No CODE BLUE  Assessment/Plan: 1) metastatic non-small cell lung cancer, squamous cell carcinoma status post clinical trial with immunotherapy with disease progression and currently undergoing systemic chemotherapy with cisplatin, gemcitabine and Portrazza status post 1 cycle. He tolerated the first cycle of his treatment well with no concerning side effects. The patient has fairly extensive disease and very poor prognosis. I would consider him for treatment again if his condition improves but he may be a good candidate for palliative care and hospice if no improvement in his current condition. 2) acute respiratory failure: This is multifactorial secondary to his progressive lung cancer in addition to recently diagnosed pneumonia as well as COPD  exacerbation. Continue current supportive care. I would consider consulting pulmonary and critical care for evaluation of his condition. 3) CODE STATUS: No CODE BLUE. 4) prognosis: Unfortunately very poor with his current condition. Thank you so much for taking good care of Bill Valdez, I will continue to follow up the patient with you and assist in his management on as-needed basis.   LOS: 7 days    Dunia Pringle K. 06/14/2015

## 2015-06-14 NOTE — Progress Notes (Signed)
Patient ID: Bill Valdez, male   DOB: 04-05-1961, 55 y.o.   MRN: [id number] [facility] PROGRESS NOTE  Erdem Naas VOZ:[id number] DOB: 1961/04/04 DOA: 06/07/2015 PCP: [name], MD  Brief narrative:    55 year old male with recently diagnosed metastatic non-small cell lung cancer to liver and brain, SVC syndrome and numerous bony metastases in September 2016 currently getting chemotherapy with Dr. Julien Nordmann who presented to [facility] with reports of subjective fevers, nonproductive cough. He was found to be septic on the admission and was started on broad-spectrum antibiotics for possible pneumonia.  Patient's hospital course was complicated with ongoing hypoxia and need for BiPAP. He was transferred to stepdown 06/12/2015. Currently he is able to keep oxygen saturation above 90% with Ventimask. Appreciate oncology seeing the patient in consultation and recommendation for pulmonary consult.   Assessment/Plan:    Principal problem:   Acute respiratory failure with hypoxia ([place]) / Sepsis due to pneumonia [facility]) / healthcare associated pneumonia / Leukopenia / Leukocytosis - Sepsis criteria on the admission with presumed source of infection pneumonia. - His chest x-ray most recently 06/12/2015 demonstrated improvement in right pleural effusion after right thoracentesis and improved bilateral lung volumes but heterogeneous opacities throughout the lungs are still persistent and relatively unchanged since prior studies - As mentioned above, patient was transferred to stepdown unit 06/12/2015 because he required BiPAP - He required thoracentesis which was done 06/12/2015 with 1.2 L fluid removed and fluid sent for analysis - He was initially on vancomycin and aztreonam but we added azithromycin 06/11/2015 since pneumonia was not improving and patient continued to be hypoxic. - Appreciate pulmonary recommendations  - I think it's also reasonable to continue symptomatic  management with morphine for work of breathing - Continue to monitor in step down unit - Blood cultures and respiratory cultures so far negative Strep pneumoniae negative.    Active problems: Interstitial edema / Pleural effusion, right - Has gotten lasix 40 mg IV 1/6 and 1/7 - Status post thoracentesis 06/12/2015 with 1.2 L fluid removed  Cancer of upper lobe of right lung (HCC) / Brain metastasis (HCC) / Bone metastasis ([place]) / Edema of upper extremity due to SVC syndrome  - Pt presented with right upper lobe suprahilar mass with questionable SVC syndrome in addition to mediastinal lymphadenopathy with extensive bone and liver metastases in September 2016.  - Has received palliative radiotherapy to the large right upper lobe lung mass followed by treatment with BMS 568 (nivolumab/ipilumumab) at [place], discontinued recently secondary to disease progression. - Pt was on systemic chemotherapy with cisplatin and gemcitabine  - Appreciate Dr. Julien Nordmann seeing the patient in consultation.  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  Pancytopenia / Anemia of chronic disease  - [place] of chemotherapy and history of malignancy - Hemoglobin is stable at 9.1, platelets are WNL  Severe protein calorie malnutrition - In the context of chronic illness - Continue nutritional supplementation DVT Prophylaxis  - SCD's bilaterally   Code Status: DNR/DNI Family Communication:  plan of care discussed with the patient and his brother and sister at the bedside Disposition Plan: Remains in step down unit.   IV access:  Peripheral IV  Procedures and diagnostic studies:    Dg Chest 1 View 06/12/2015   No pneumothorax post thoracentesis.  US Thoracentesis Asp Pleural Space W/img Guide 06/12/2015   Successful ultrasound guided right thoracentesis yielding 1200 cc of pleural fluid.  After the  procedure was performed, just prior to transfer of the patient back to the floor, the patient became tachypneic. His pulse ox saturation droped to 70 percent. Poles became 962 with of systolic blood pressure of 114. The blood pressure was stable. He complained of dyspnea but remain did lucid throughout this episode. Rapid response was called in preparation for endotracheal intubation. The patient and family expressed wishes of do not resuscitate and refused endotracheal intubation. BiPAP support was instituted, then the patient was transferred to the ICU. Electronically Signed   By: Marybelle Killings M.D.   On: 06/12/2015 14:58   Dg Chest Portable 1 View 06/07/2015  1. Volume loss in the right hemithorax with right perihilar mass, right basilar opacity and pleural effusion. No interval exams since staging PET-CT, and findings may reflect a combination of post treatment related change, atelectasis or collapse, or pneumonia. 2. Patchy opacity at the left lung base is nonspecific, pneumonia is favored, however new metastatic disease not excluded radiographically.   Dg Chest Port 1 View 06/11/2015  Worsening diffuse interstitial prominence throughout the lungs, right greater than left. This could represent edema or pneumonia. Focal left lower lobe opacity again noted, stable. Enlarging moderate right pleural effusion.   Medical Consultants:  None  Other Consultants:  Nutrition   IAnti-Infectives:   Aztreonam and vancomycin 06/07/2015 --> Azithromycin 06/11/2015 -->   Leisa Lenz, MD  [facility] Pager [phone]  Time spent in minutes: 25 minutes  If 7PM-7AM, please contact night-coverage [email or web address] Password [email or web address] 06/14/2015, 8:29 AM   LOS: 7 days    HPI/Subjective: No acute overnight events. On Ventimask.  Objective: Filed Vitals:   06/14/15 0000 06/14/15 0200 06/14/15 0400 06/14/15 0600  BP: 117/59 118/59 115/68 119/65  Pulse: 110 115 116 109  Temp: 98.3 F (36.8 C)  98.1 F (36.7 C)    TempSrc: Axillary  Axillary   Resp: 24 27 33 22  Height:      Weight:      SpO2: 99% 95% 93% 99%    Intake/Output Summary (Last 24 hours) at 06/14/15 0829 Last data filed at 06/14/15 0600  Gross per 24 hour  Intake   1370 ml  Output    325 ml  Net   1045 ml    Exam:   General:  Patient lethargic, on Ventimask  Cardiovascular: Appreciate S1-S2, rate controlled  Respiratory: Diminished breath sounds, no rhonchi or wheezing  Abdomen: Nontender abdomen, appreciate bowel sounds  Extremities: UE swelling bilaterally, no lower extremity swelling  Neuro: No focal deficits  Data Reviewed: Basic Metabolic Panel:  Recent Labs Lab 06/08/15 0425 06/08/15 1602 06/09/15 0404 06/11/15 0410 06/12/15 0500  NA 126* 125* 126* 127* 128*  K 4.1 3.7 3.7 3.7 3.1*  CL 93* 93* 93* 92* 92*  CO2 '25 26 25 27 29  '$ GLUCOSE 117* 129* 95 128* 107*  BUN '13 12 11 9 8  '$ CREATININE 0.32* 0.41* 0.33* <0.30* 0.37*  CALCIUM 8.1* 7.9* 8.0* 7.8* 7.9*   Liver Function Tests:  Recent Labs Lab 06/07/15 2030  AST 45*  ALT 28  ALKPHOS 146*  BILITOT 1.2  PROT 5.8*  ALBUMIN 2.0*   No results for input(s): LIPASE, AMYLASE in the last 168 hours. No results for input(s): AMMONIA in the last 168 hours. CBC:  Recent Labs Lab 06/07/15 2030 06/08/15 0425 06/09/15 0404 06/11/15 0410 06/12/15 0500  WBC 3.6* 4.1 7.6 19.8* 28.7*  NEUTROABS 3.2 3.5  --   --   --  HGB 9.4* 8.2* 8.5* 8.9* 9.1*  HCT 28.2* 24.5* 26.0* 26.9* 27.9*  MCV 83.7 83.6 83.9 84.1 86.1  PLT 84* 62* 62* 199 275   Cardiac Enzymes: No results for input(s): CKTOTAL, CKMB, CKMBINDEX, TROPONINI in the last 168 hours. BNP: Invalid input(s): POCBNP CBG: No results for input(s): GLUCAP in the last 168 hours.  Recent Results (from the past 240 hour(s))  Blood Culture (routine x 2)     Status: None   Collection Time: 06/07/15  9:45 PM  Result Value Ref Range Status   Specimen Description BLOOD PORTA CATH  Final   Special  Requests BOTTLES DRAWN AEROBIC AND ANAEROBIC 5 ML  Final   Culture   Final    NO GROWTH 5 DAYS Performed at [facility]    Report Status 06/13/2015 FINAL  Final  Blood Culture (routine x 2)     Status: None   Collection Time: 06/07/15  9:46 PM  Result Value Ref Range Status   Specimen Description BLOOD RIGHT ANTECUBITAL  Final   Special Requests BOTTLES DRAWN AEROBIC AND ANAEROBIC 5 ML  Final   Culture   Final    NO GROWTH 5 DAYS Performed at [facility]    Report Status 06/13/2015 FINAL  Final  Urine culture     Status: None   Collection Time: 06/08/15  2:10 AM  Result Value Ref Range Status   Specimen Description URINE, CLEAN CATCH  Final   Special Requests NONE  Final   Culture   Final    NO GROWTH 1 DAY Performed at [facility]    Report Status 06/09/2015 FINAL  Final  Culture, respiratory (NON-Expectorated)     Status: None   Collection Time: 06/08/15 10:20 AM  Result Value Ref Range Status   Specimen Description SPUTUM  Final   Special Requests NONE  Final   Gram Stain   Final    ABUNDANT WBC PRESENT,BOTH PMN AND MONONUCLEAR FEW SQUAMOUS EPITHELIAL CELLS PRESENT ABUNDANT GRAM VARIABLE ROD ABUNDANT GRAM POSITIVE COCCI IN PAIRS    Culture   Final    NORMAL OROPHARYNGEAL FLORA Performed at [facility]    Report Status 06/10/2015 FINAL  Final  Culture, expectorated sputum-assessment     Status: None   Collection Time: 06/08/15 10:20 AM  Result Value Ref Range Status   Specimen Description SPUTUM  Final   Special Requests NONE  Final   Sputum evaluation   Final    THIS SPECIMEN IS ACCEPTABLE. RESPIRATORY CULTURE REPORT TO FOLLOW.   Report Status 06/08/2015 FINAL  Final  Culture, body fluid-bottle     Status: None (Preliminary result)   Collection Time: 06/12/15  3:11 PM  Result Value Ref Range Status   Specimen Description FLUID RIGHT PLEURAL  Final   Special Requests IN PEDIATRIC BOTTLE 5CC  Final   Gram Stain   Final    GRAM  POSITIVE COCCI IN CLUSTERS GRAM POSITIVE RODS CALLED TO EARLY,S RN 06/13/15 2047 [place] Performed at [facility]    Culture PENDING  Incomplete   Report Status PENDING  Incomplete  Gram stain     Status: None   Collection Time: 06/12/15  3:11 PM  Result Value Ref Range Status   Specimen Description FLUID RIGHT PLEURAL  Final   Special Requests NONE  Final   Gram Stain   Final    CYTOSPIN SMEAR WBC PRESENT,BOTH PMN AND MONONUCLEAR NO ORGANISMS SEEN Performed at [facility]  Report Status 06/13/2015 FINAL  Final  MRSA PCR Screening     Status: None   Collection Time: 06/12/15  3:12 PM  Result Value Ref Range Status   MRSA by PCR NEGATIVE NEGATIVE Final    Comment:        The GeneXpert MRSA Assay (FDA approved for NASAL specimens only), is one component of a comprehensive MRSA colonization surveillance program. It is not intended to diagnose MRSA infection nor to guide or monitor treatment for MRSA infections.      Scheduled Meds: . azithromycin  500 mg Intravenous Q24H  . aztreonam  2 g Intravenous 3 times per day  . benzonatate  100 mg Oral BID  . chlorpheniramine-HYDROcodone  10 mL Oral Q12H  . feeding supplement (ENSURE ENLIVE)  237 mL Oral BID BM  . senna-docusate  1 tablet Oral BID  . sodium chloride  10-40 mL Intracatheter Q12H  . vancomycin  1,250 mg Intravenous Q8H   Continuous Infusions: . sodium chloride 10 mL/hr at 06/14/15 0600  . morphine

## 2015-06-14 NOTE — Consult Note (Signed)
Name: Bill Valdez MRN: [id number] DOB: 1961/03/06    ADMISSION DATE:  06/07/2015 CONSULTATION DATE:  06/14/15  REFERRING MD :  Dr. Julien Nordmann  CHIEF COMPLAINT:  SOB  HISTORY OF PRESENT ILLNESS:  55 y/o M, former ETOH (quit 25 years ago), former smoker (quit 2012, 2ppd x 30 years)  with PMH of hernia repair and Metastatic (liver & bony mets, SVC syndrome) Non-Small Cell Lung Cancer, Squamous Cell s/p clinical trial immunotherapy with disease progression, prior palliative XRT to lung mass, hip lesion and cranial, currently on sisplatin, gemcitabine and portrazza s/p 1 cycle admitted 06/07/15 to [facility]  with complaints of fever, shortness of breath and cough.    Initial work up notable for CXR with new RLL consolidation / effusion, patchy LLL infiltrate, Fever to 102, WBC 3.  The patient was admitted per [facility] for further evaluation.  The patient was treated for HCAP with IV abx and hydration.  Hospital course complicated by acute on chronic respiratory failure with hypoxia requiring VM to maintain saturations > 90%.  Cultures and urine antigens are negative thus far.  Discussions with primary service and patient reflect he desires to be DNR / DNI.  However, he was transferred to SDU for BiPAP on 1/7.  He underwent a right thoracentesis on 1/7 per IR with 1200 ml clear fluid removed.  Morphine and ativan being utilized for dyspnea.  Pleural fluid notable for WBC 1156 with 72% neutrophil.  No other studies completed.  Oncology consulted while inpatient for input regarding therapy - notes reflect poor prognosis.  PCCM consulted by Dr. Julien Nordmann for further pulmonary recommendations.      Of note, the patient and family have had several deaths of first degree relatives in the past few months.     PAST MEDICAL HISTORY :   has no past medical history on file.  has past surgical history that includes Hernia repair and Cyst removal neck.   Prior to Admission medications   Medication Sig Start Date End Date Taking?  Authorizing Provider  chlorpheniramine-HYDROcodone (TUSSIONEX) 10-8 MG/5ML SUER Take 5 milliliters by mouth every 12 hours if needed for cough for 05/14/15  Yes Historical Provider, MD  ibuprofen (ADVIL,MOTRIN) 600 MG tablet Take 600 mg by mouth every 6 (six) hours as needed for moderate pain. Reported on 06/02/2015   Yes Historical Provider, MD  polyethylene glycol (MIRALAX / GLYCOLAX) packet Take 17 g by mouth daily as needed for mild constipation.   Yes Historical Provider, MD  prochlorperazine (COMPAZINE) 10 MG tablet Take 1 tablet (10 mg total) by mouth every 6 (six) hours as needed for nausea or vomiting. 02/25/15  Yes Curt Bears, MD  sennosides-docusate sodium (SENOKOT-S) 8.6-50 MG tablet Take 1 tablet by mouth 2 (two) times daily.   Yes Historical Provider, MD  doxycycline (VIBRA-TABS) 100 MG tablet Take 1 tablet (100 mg total) by mouth 2 (two) times daily. Start when having skin rash. 05/19/15   Curt Bears, MD  lidocaine-prilocaine (EMLA) cream Apply 1 application topically as needed. 02/25/15   Curt Bears, MD  ondansetron (ZOFRAN) 8 MG tablet Take 1 tablet (8 mg total) by mouth every 8 (eight) hours as needed for nausea or vomiting. 05/26/15   Curt Bears, MD   Allergies  Allergen Reactions  . Penicillins Other (See Comments)    Childhood Has patient had a PCN reaction causing immediate rash, facial/tongue/throat swelling, SOB or lightheadedness with hypotension: No. Has patient had a PCN reaction causing severe rash involving mucus membranes or skin  necrosis: No Has patient had a PCN reaction that required hospitalization No Has patient had a PCN reaction occurring within the last 10 years: No If all of the above answers are "NO", then may proceed with Cephalospor    FAMILY HISTORY:  family history includes Cancer in his father.   SOCIAL HISTORY:  reports that he quit smoking about 4 years ago. His smoking use included Cigarettes. He has a 45 pack-year smoking  history. He has quit using smokeless tobacco. He reports that he does not drink alcohol or use illicit drugs.  REVIEW OF SYSTEMS:   Constitutional: Negative for fever, chills, weight loss, and diaphoresis. Reports fatigue HENT: Negative for hearing loss, ear pain, nosebleeds, congestion, sore throat, neck pain, tinnitus and ear discharge.   Eyes: Negative for blurred vision, double vision, photophobia, pain, discharge and redness.  Respiratory: Negative for hemoptysis, wheezing and stridor.   Reports cough with yellow sputum production, shortness of breath, occasional pleuritic chest pain with coughing Cardiovascular: Negative for palpitations, orthopnea, claudication, leg swelling and PND.  Gastrointestinal: Negative for heartburn, nausea, vomiting, abdominal pain, diarrhea, constipation, blood in stool and melena.  Genitourinary: Negative for dysuria, urgency, frequency, hematuria and flank pain.  Musculoskeletal: Negative for myalgias, back pain, joint pain and falls.  Skin: Negative for itching and rash.  Neurological: Negative for dizziness, tingling, tremors, sensory change, speech change, focal weakness, seizures, loss of consciousness, weakness and headaches.  Endo/Heme/Allergies: Negative for environmental allergies and polydipsia. Does not bruise/bleed easily.  SUBJECTIVE:   VITAL SIGNS: Temp:  [97.7 F (36.5 C)-99 F (37.2 C)] 97.7 F (36.5 C) (01/09 0832) Pulse Rate:  [109-116] 109 (01/09 0600) Resp:  [16-33] 22 (01/09 0600) BP: (99-121)/(55-68) 119/65 mmHg (01/09 0600) SpO2:  [93 %-99 %] 99 % (01/09 0600)  PHYSICAL EXAMINATION: General:  Chronically ill appearing male in NAD Neuro:  AAOx4, appears weak, speech clear, MAE HEENT:  MM pink/dry, no jvd, trachea deviated to R Cardiovascular:  s1s2 rrr, tachycardia, no m/r/g Lungs:  Even/non-labored, diminished on R, good air movement on L Abdomen:  NTND, bsx4 active  Musculoskeletal:  No acute deformities  Skin:  Warm/dry,  trace BLE pitting edema    Recent Labs Lab 06/09/15 0404 06/11/15 0410 06/12/15 0500  NA 126* 127* 128*  K 3.7 3.7 3.1*  CL 93* 92* 92*  CO2 '25 27 29  '$ BUN '11 9 8  '$ CREATININE 0.33* <0.30* 0.37*  GLUCOSE 95 128* 107*    Recent Labs Lab 06/09/15 0404 06/11/15 0410 06/12/15 0500  HGB 8.5* 8.9* 9.1*  HCT 26.0* 26.9* 27.9*  WBC 7.6 19.8* 28.7*  PLT 62* 199 275   Dg Chest 1 View  06/12/2015  CLINICAL DATA:  Status post thoracentesis EXAM: CHEST 1 VIEW COMPARISON:  Yesterday FINDINGS: The right pleural effusion has improved after right thoracentesis. Overall bilateral lung volumes are improved. Heterogeneous opacities throughout both lungs persist and are not significantly changed. Left jugular Port-A-Cath is stable. There is no pneumothorax. IMPRESSION: No pneumothorax post thoracentesis. Electronically Signed   By: Marybelle Killings M.D.   On: 06/12/2015 14:55   US Thoracentesis Asp Pleural Space W/img Guide  06/12/2015  CLINICAL DATA:  Right pleural effusion EXAM: ULTRASOUND GUIDED RIGHT THORACENTESIS COMPARISON:  None. PROCEDURE: An ultrasound guided thoracentesis was thoroughly discussed with the patient and questions answered. The benefits, risks, alternatives and complications were also discussed. The patient understands and wishes to proceed with the procedure. Written consent was obtained. Ultrasound was performed to localize and mark an adequate  pocket of fluid in the right chest. The area was then prepped and draped in the normal sterile fashion. 1% Lidocaine was used for local anesthesia. Under ultrasound guidance a Safe-T-Centesis catheter was introduced. Thoracentesis was performed. The catheter was removed and a dressing applied. COMPLICATIONS: None. FINDINGS: A total of approximately 1200 cc of clear yellow fluid was removed. A fluid sample was sent for laboratory analysis. IMPRESSION: Successful ultrasound guided right thoracentesis yielding 1200 cc of pleural fluid. After the  procedure was performed, just prior to transfer of the patient back to the floor, the patient became tachypneic. His pulse ox saturation droped to 70 percent. Poles became 517 with of systolic blood pressure of 114. The blood pressure was stable. He complained of dyspnea but remain did lucid throughout this episode. Rapid response was called in preparation for endotracheal intubation. The patient and family expressed wishes of do not resuscitate and refused endotracheal intubation. BiPAP support was instituted, then the patient was transferred to the ICU. Electronically Signed   By: Marybelle Killings M.D.   On: 06/12/2015 14:58    SIGNIFICANT EVENTS  01/02  Admit to [facility] 01/09  PCCM consulted for SOB, concern for PNA  STUDIES:  01/07  CXR >> improved effusion post thora, no pneumothorax  DISCUSSION:   55 y/o M with known metastatic non-small cell lung cancer, squamous cell s/p trial immunotherapy, palliative XRT and chemotherapy with disease progression.  Admitted with concern for HCAP.  Patient has had increasing O2 needs and poor cough clearance.  PCCM consulted for evalution.  ASSESSMENT / PLAN:   Acute Respiratory Failure - secondary to effusion, metastatic lung cancer  Right Pleural Effusion -concerning for parapneumonic process (WBC 1156, neutrophil predominant) ? HCAP - strep antigen negative, concern for R infiltrate, no organism specified  Plan:  Push pulmonary hygiene as pt tolerates Vibra vest Q4 while awake Mucomyst Q4 with albuterol x 8 doses  Upright positioning  BiPAP PRN for increased work of breathing PRN morphine / ativan for SOB Oxygen as needed to support sats >90% Intermittent CXR  Continue abx per primary SVC, azactam + vanco Will need ongoing discussions regarding goals of care.    Metastatic Non-Small Cell Lung Cancer, Squamous Cell - s/p clinical trial immunotherapy with disease progression, currently on sisplatin, gemcitabine and portrazza s/p 1 cycle  Plan: Per  Dr. Julien Nordmann  Thank you for the consultation.  PCCM will follow along with you.    Noe Gens, NP-C [facility] Pgr: [phone] or if no answer [phone] 06/14/2015, 10:51 AM

## 2015-06-14 NOTE — Progress Notes (Signed)
Date: June 14, 2015 Chart reviewed for concurrent status and case management needs. Will continue to follow patient for changes and needs: Velva Harman, RN, BSN, [place]   [phone]

## 2015-06-15 ENCOUNTER — Other Ambulatory Visit: Payer: BLUE CROSS/BLUE SHIELD

## 2015-06-15 ENCOUNTER — Ambulatory Visit: Payer: BLUE CROSS/BLUE SHIELD | Admitting: Internal Medicine

## 2015-06-15 LAB — CREATININE, SERUM
Creatinine, Ser: 0.44 mg/dL — ABNORMAL LOW (ref 0.61–1.24)
GFR calc non Af Amer: >60 mL/min (ref >60)

## 2015-06-15 LAB — VANCOMYCIN, TROUGH: Vancomycin Tr: 26 ug/mL (ref 10.0–20.0)

## 2015-06-15 MED ORDER — CETYLPYRIDINIUM CHLORIDE 0.05 % MT LIQD
7.0000 mL | Freq: Two times a day (BID) | OROMUCOSAL | Status: DC
Start: 2015-06-15 — End: 2015-06-20
  Administered 2015-06-16 – 2015-06-20 (×6): 7 mL via OROMUCOSAL

## 2015-06-15 MED ORDER — VANCOMYCIN HCL 10 G IV SOLR
1250.0000 mg | Freq: Two times a day (BID) | INTRAVENOUS | Status: DC
  Administered 2015-06-15 – 2015-06-17 (×4): 1250 mg via INTRAVENOUS
  Filled 2015-06-15 (×4): qty 1250

## 2015-06-15 NOTE — Progress Notes (Signed)
CPT not done at this time. Pt requesting to continue CPT again in the am. RT will continue to monitor.

## 2015-06-15 NOTE — Progress Notes (Signed)
Patient ID: Bill Valdez, male   DOB: [birth year], 55 y.o.   MRN: [id number] [facility] PROGRESS NOTE  Willet Schleifer YWV:[id number] DOB: [birth year] DOA: 06/07/2015 PCP: [name], MD  Brief narrative:    55 year old male with recently diagnosed metastatic non-small cell lung cancer to liver and brain, SVC syndrome and numerous bony metastases in September 2016 currently getting chemotherapy with Dr. Julien Nordmann who presented to [facility] with reports of subjective fevers, nonproductive cough. He was found to be septic on the admission and was started on broad-spectrum antibiotics for possible pneumonia.  Patient's hospital course was complicated with ongoing hypoxia and need for BiPAP. He was transferred to stepdown 06/12/2015. Currently he is able to keep oxygen saturation above 90% with Ventimask. Appreciate oncology seeing the patient in consultation. Pulmonary is also assisting the management of patient's respiratory status.    Assessment/Plan:    Principal problem:   Acute respiratory failure with hypoxia (HCC) / Sepsis due to pneumonia [facility]) / healthcare associated pneumonia / Leukopenia / Leukocytosis - Sepsis criteria on the admission with presumed source of infection pneumonia. - Patient's chest x-ray most recently 06/12/2015 demonstrated improvement in right pleural effusion after right thoracentesis and improved bilateral lung volumes but heterogeneous opacities throughout the lungs are still persistent and relatively unchanged since prior studies - As mentioned above, patient was transferred to stepdown unit 06/12/2015 because he required BiPAP - He had thoracentesis 06/12/2015 with 1.2 L fluid removed, fluid culture result is pending  - He was initially on vancomycin and aztreonam but we added azithromycin 06/11/2015 since pneumonia was not improving and patient continued to be hypoxic. Azithromycin stopped 06/14/2015.  - Appreciate pulmonary team following and  their recommendations  - We will continue symptomatic management with morphine for work of breathing - Blood cultures and respiratory cultures so far negative Strep pneumoniae is negative as well. - Continue to monitor in SDU as patient's oxygen requirements are still pretty high, now on 100% VM and oxygen saturation 93%    Active problems: Interstitial edema / Pleural effusion, right - Has gotten lasix 40 mg IV 1/6 and 1/7 - Status post thoracentesis 06/12/2015 with 1.2 L fluid removed. Fluid culture result is pending as of 06/15/2015   Cancer of upper lobe of right lung ([place]) / Brain metastasis (HCC) / Bone metastasis ([place]) / Edema of upper extremity due to SVC syndrome  - Pt presented with right upper lobe suprahilar mass with questionable SVC syndrome in addition to mediastinal lymphadenopathy with extensive bone and liver metastases in September 2016.  - Has received palliative radiotherapy to the large right upper lobe lung mass followed by treatment with BMS 568 (nivolumab/ipilumumab) at [place], discontinued recently secondary to disease progression. - Pt was on systemic chemotherapy with cisplatin and gemcitabine  - Appreciate Dr. Julien Nordmann seeing the patient in consultation. 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 - Sodium slowly improving from 126 --> 131 on 06/14/2015   Pancytopenia / Anemia of chronic disease  - [place] of chemotherapy and history of malignancy - Hemoglobin is stable at 9.1, platelets are WNL - Check CBC tomorrow am   Severe protein calorie malnutrition - In the context of chronic illness - Continue nutritional supplementation  DVT Prophylaxis  - SCD's bilaterally in hospital    Code Status: DNR/DNI Family Communication:  plan of care discussed with the patient and his brother and sister at  the  bedside Disposition Plan: Remains in step down unit due to high oxygen requirements   IV access:  Peripheral IV  Procedures and diagnostic studies:    Dg Chest 1 View 06/12/2015   No pneumothorax post thoracentesis.  US Thoracentesis Asp Pleural Space W/img Guide 06/12/2015   Successful ultrasound guided right thoracentesis yielding 1200 cc of pleural fluid. After the procedure was performed, just prior to transfer of the patient back to the floor, the patient became tachypneic. His pulse ox saturation droped to 70 percent. Poles became 191 with of systolic blood pressure of 114. The blood pressure was stable. He complained of dyspnea but remain did lucid throughout this episode. Rapid response was called in preparation for endotracheal intubation. The patient and family expressed wishes of do not resuscitate and refused endotracheal intubation. BiPAP support was instituted, then the patient was transferred to the ICU. Electronically Signed   By: Marybelle Killings M.D.   On: 06/12/2015 14:58   Dg Chest Portable 1 View 06/07/2015  1. Volume loss in the right hemithorax with right perihilar mass, right basilar opacity and pleural effusion. No interval exams since staging PET-CT, and findings may reflect a combination of post treatment related change, atelectasis or collapse, or pneumonia. 2. Patchy opacity at the left lung base is nonspecific, pneumonia is favored, however new metastatic disease not excluded radiographically.   Dg Chest Port 1 View 06/11/2015  Worsening diffuse interstitial prominence throughout the lungs, right greater than left. This could represent edema or pneumonia. Focal left lower lobe opacity again noted, stable. Enlarging moderate right pleural effusion.   Medical Consultants:  None  Other Consultants:  Nutrition   IAnti-Infectives:   Aztreonam and vancomycin 06/07/2015 --> Azithromycin 06/11/2015 --> 06/14/2015   Leisa Lenz, MD  [facility] Pager [phone]  Time spent in  minutes: 25 minutes  If 7PM-7AM, please contact night-coverage [email or web address] Password [email or web address] 06/15/2015, 12:13 PM   LOS: 8 days    HPI/Subjective: No acute overnight events. Stable on Ventimask.  Objective: Filed Vitals:   06/15/15 1000 06/15/15 1100 06/15/15 1200 06/15/15 1209  BP: 123/59  126/62   Pulse: 103 104 107   Temp:      TempSrc:      Resp: '20 23 23   '$ Height:      Weight:      SpO2: 96% 94% 89% 93%    Intake/Output Summary (Last 24 hours) at 06/15/15 1213 Last data filed at 06/15/15 1200  Gross per 24 hour  Intake   1395 ml  Output    725 ml  Net    670 ml    Exam:   General:  Patient stable, on Ventimask, stable   Cardiovascular: Rate controlled, appreciate S1, S2   Respiratory: Diminished, coarse breath sounds, no wheezing   Abdomen: (+) BS, non tender   Extremities: UE swelling bilaterally, no leg edema  Neuro: Nonfocal   Data Reviewed: Basic Metabolic Panel:  Recent Labs Lab 06/08/15 1602 06/09/15 0404 06/11/15 0410 06/12/15 0500 06/14/15 1031 06/15/15 0529  NA 125* 126* 127* 128* 131*  --   K 3.7 3.7 3.7 3.1* 3.5  --   CL 93* 93* 92* 92* 93*  --   CO2 '26 25 27 29 29  '$ --   GLUCOSE 129* 95 128* 107* 100*  --   BUN '12 11 9 8 12  '$ --   CREATININE 0.41* 0.33* <0.30* 0.37* 0.34* 0.44*  CALCIUM 7.9* 8.0* 7.8* 7.9* 7.9*  --  Liver Function Tests: No results for input(s): AST, ALT, ALKPHOS, BILITOT, PROT, ALBUMIN in the last 168 hours. No results for input(s): LIPASE, AMYLASE in the last 168 hours. No results for input(s): AMMONIA in the last 168 hours. CBC:  Recent Labs Lab 06/09/15 0404 06/11/15 0410 06/12/15 0500  WBC 7.6 19.8* 28.7*  HGB 8.5* 8.9* 9.1*  HCT 26.0* 26.9* 27.9*  MCV 83.9 84.1 86.1  PLT 62* 199 275   Cardiac Enzymes: No results for input(s): CKTOTAL, CKMB, CKMBINDEX, TROPONINI in the last 168 hours. BNP: Invalid input(s): POCBNP CBG: No results for input(s): GLUCAP in the last 168 hours.  Blood Culture  (routine x 2)     Status: None   Collection Time: 06/07/15  9:45 PM  Result Value Ref Range Status   Specimen Description BLOOD PORTA CATH  Final   Special Requests BOTTLES DRAWN AEROBIC AND ANAEROBIC 5 ML  Final   Culture   Final    NO GROWTH 5 DAYS Performed at [facility]    Report Status 06/13/2015 FINAL  Final  Blood Culture (routine x 2)     Status: None   Collection Time: 06/07/15  9:46 PM  Result Value Ref Range Status   Specimen Description BLOOD RIGHT ANTECUBITAL  Final   Special Requests BOTTLES DRAWN AEROBIC AND ANAEROBIC 5 ML  Final   Culture   Final    NO GROWTH 5 DAYS Performed at [facility]    Report Status 06/13/2015 FINAL  Final  Urine culture     Status: None   Collection Time: 06/08/15  2:10 AM  Result Value Ref Range Status   Specimen Description URINE, CLEAN CATCH  Final   Special Requests NONE  Final   Culture   Final    NO GROWTH 1 DAY Performed at [facility]    Report Status 06/09/2015 FINAL  Final  Culture, respiratory (NON-Expectorated)     Status: None   Collection Time: 06/08/15 10:20 AM  Result Value Ref Range Status   Specimen Description SPUTUM  Final   Special Requests NONE  Final   Gram Stain   Final   Culture   Final    NORMAL OROPHARYNGEAL FLORA Performed at [facility]    Report Status 06/10/2015 FINAL  Final  Culture, expectorated sputum-assessment     Status: None   Collection Time: 06/08/15 10:20 AM  Result Value Ref Range Status   Specimen Description SPUTUM  Final   Special Requests NONE  Final   Sputum evaluation   Final    THIS SPECIMEN IS ACCEPTABLE. RESPIRATORY CULTURE REPORT TO FOLLOW.   Report Status 06/08/2015 FINAL  Final  Culture, body fluid-bottle     Status: None (Preliminary result)   Collection Time: 06/12/15  3:11 PM  Result Value Ref Range Status   Specimen Description FLUID RIGHT PLEURAL  Final   Special Requests IN PEDIATRIC BOTTLE 5CC  Final   Gram Stain   Final     GRAM POSITIVE COCCI IN CLUSTERS GRAM POSITIVE RODS CALLED TO EARLY,S RN 06/13/15 2047 Charles Mix    Culture   Final    TOO YOUNG TO READ Performed at [facility]    Report Status PENDING  Incomplete  Gram stain     Status: None   Collection Time: 06/12/15  3:11 PM  Result Value Ref Range Status   Specimen Description FLUID RIGHT PLEURAL  Final   Special Requests NONE  Final   Gram Stain  Final    CYTOSPIN SMEAR WBC PRESENT,BOTH PMN AND MONONUCLEAR NO ORGANISMS SEEN Performed at [facility]    Report Status 06/13/2015 FINAL  Final  MRSA PCR Screening     Status: None   Collection Time: 06/12/15  3:12 PM  Result Value Ref Range Status   MRSA by PCR NEGATIVE NEGATIVE Final     Scheduled Meds: . acetylcysteine  3 mL Nebulization Q4H  . albuterol  2.5 mg Nebulization Q4H  . antiseptic oral rinse  7 mL Mouth Rinse BID  . aztreonam  2 g Intravenous 3 times per day  . benzonatate  100 mg Oral BID  . chlorpheniramine-HYDROcodone  10 mL Oral Q12H  . feeding supplement (ENSURE ENLIVE)  237 mL Oral BID BM  . senna-docusate  1 tablet Oral BID  . sodium chloride  10-40 mL Intracatheter Q12H  . vancomycin  1,250 mg Intravenous Q12H   Continuous Infusions: . sodium chloride 10 mL/hr at 06/14/15 0600

## 2015-06-15 NOTE — Progress Notes (Signed)
CRITICAL VALUE ALERT  Critical value received:  Vancomycin Trough 26  Date of notification:  06/15/2015  Time of notification:  0615  Critical value read back:Yes.    Nurse who received alert:  SDonnetta Hutching, RN  MD notified (1st page):  Pharmacy  Time of first page:  0617  MD notified (2nd page):  Time of second page:  Responding MD:  Pharmacy-   Per pharmacy- stop the vancomycin and they will adjust the dosage.  Time MD responded:  [phone]

## 2015-06-15 NOTE — Progress Notes (Signed)
Pharmacy Antibiotic Follow-up Note  Bill Valdez is a 55 y.o. year-old male admitted on 06/07/2015.  The patient is currently on day #9 of vancomycin/aztreonam, day # 5 azithromycin for pneumonia.  On Aztreonam for PCN allergy but this appears to have been a childhood allergy and cephalosporin would be OK based on responses.  Pneumonia is not improving per [facility] notes.  GPC clusters identified in pleural fluid culture.  -WBC is worsening per labs (Last checked 1/7) -SCr appears stable this am, ? Slight increase. Marginal UOP but does not appear to be on strict I/O - afebrile  Assessment/Plan:  Vancomycin trough elevated, change to vancomycin '1250mg'$  IV q12h for new est peak = 35 and trough = 15  Aztreonam 2gm IV q8h, note cephalosporin would be appropriate in this patient as the PCN allergy is a childhood rxn (non-type I rxn)  Await culture results and hopefully de-escalate antibiotics accordingly  Temp (24hrs), Avg:98.2 F (36.8 C), Min:97.3 F (36.3 C), Max:99.5 F (37.5 C)   Recent Labs Lab 06/09/15 0404 06/11/15 0410 06/12/15 0500  WBC 7.6 19.8* 28.7*     Recent Labs Lab 06/09/15 0404 06/11/15 0410 06/12/15 0500 06/14/15 1031 06/15/15 0529  CREATININE 0.33* <0.30* 0.37* 0.34* 0.44*   Estimated Creatinine Clearance: 105.3 mL/min (by C-G formula based on Cr of 0.44).    Allergies  Allergen Reactions  . Penicillins Other (See Comments)    Childhood Has patient had a PCN reaction causing immediate rash, facial/tongue/throat swelling, SOB or lightheadedness with hypotension: No. Has patient had a PCN reaction causing severe rash involving mucus membranes or skin necrosis: No Has patient had a PCN reaction that required hospitalization No Has patient had a PCN reaction occurring within the last 10 years: No If all of the above answers are "NO", then may proceed with Cephalospor    Antimicrobials this admission: 1/2 >> Vanc >>  1/3 >> Azactam >>  1/6 >> Azithromycin  >>  Levels/dose changes this admission: 1/5 0600: VT 8 on '750mg'$  iv q8hr--prior doses charted correctly.  1/6 1930: VT 11 on 1gm IV q8h (missed 12n dose on 1/5) 1/10 0530: VT 26 on '1250mg'$  IV q8h   Microbiology results: 1/2 bloodx2: NGF  1/2 urine: NGF  1/3 sputum: normal flora-final  1/3 Strep pneumo antigen negative  1/7 R pleural fluid: GPC clusters  MRSA PCR neg  Thank you for allowing pharmacy to be a part of this patient's care.  Doreene Eland, PharmD, BCPS.   Pager: [phone] 06/15/2015 7:08 AM

## 2015-06-15 NOTE — Progress Notes (Signed)
Name: Bill Valdez MRN: [id number] DOB: 1960/09/15    ADMISSION DATE:  06/07/2015 CONSULTATION DATE:  06/14/15  REFERRING MD :  Dr. Julien Nordmann  CHIEF COMPLAINT:  SOB  SUBJECTIVE: RN reports O2 weaned down to 6L per [place].  Pt denies acute complaints.  Hopeful to go home in the next few days.    VITAL SIGNS: Temp:  [97.3 F (36.3 C)-99.5 F (37.5 C)] 97.8 F (36.6 C) (01/10 0841) Pulse Rate:  [99-123] 99 (01/10 0600) Resp:  [14-33] 17 (01/10 0600) BP: (119-152)/(56-70) 119/57 mmHg (01/10 0600) SpO2:  [84 %-100 %] 97 % (01/10 0600) FiO2 (%):  [100 %] 100 % (01/10 0534) Weight:  [155 lb 6.8 oz (70.5 kg)] 155 lb 6.8 oz (70.5 kg) (01/10 0600)  PHYSICAL EXAMINATION: General:  Chronically ill appearing male in NAD, generalized weakness Neuro:  AAOx4, appears weak, speech clear, MAE HEENT:  MM pink/dry, no jvd, trachea deviated to R Cardiovascular:  s1s2 rrr, tachycardia, no m/r/g Lungs:  Even/non-labored, coarse breath sounds, good air movement  Abdomen:  NTND, bsx4 active  Musculoskeletal:  No acute deformities  Skin:  Warm/dry, trace BLE pitting edema    Recent Labs Lab 06/11/15 0410 06/12/15 0500 06/14/15 1031 06/15/15 0529  NA 127* 128* 131*  --   K 3.7 3.1* 3.5  --   CL 92* 92* 93*  --   CO2 '27 29 29  '$ --   BUN '9 8 12  '$ --   CREATININE <0.30* 0.37* 0.34* 0.44*  GLUCOSE 128* 107* 100*  --     Recent Labs Lab 06/09/15 0404 06/11/15 0410 06/12/15 0500  HGB 8.5* 8.9* 9.1*  HCT 26.0* 26.9* 27.9*  WBC 7.6 19.8* 28.7*  PLT 62* 199 275   Dg Chest Port 1 View  06/14/2015  CLINICAL DATA:  Acute respiratory failure. EXAM: PORTABLE CHEST 1 VIEW COMPARISON:  06/12/2015 and CT chest 02/18/2015. FINDINGS: Trachea is midline. Left IJ power port tip projects over the low SVC or SVC RA junction, stable. Right hilar soft tissue prominence, stable. Mixed interstitial and airspace opacification, right greater than left, similar to 06/12/2015. Biapical pleural thickening. Moderate right  pleural effusion. Healing lower left lateral rib fracture. IMPRESSION: 1. Low right paratracheal adenopathy. 2. Mixed interstitial and airspace opacification, right greater than left, possibly due to edema or pneumonia, stable 3. Moderate right pleural effusion. Electronically Signed   By: Lorin Picket M.D.   On: 06/14/2015 15:10    SIGNIFICANT EVENTS  01/02  Admit to [facility] 01/09  PCCM consulted for SOB, concern for PNA  STUDIES:  01/07  CXR >> improved effusion post thora, no pneumothorax  DISCUSSION:   55 y/o M with known metastatic non-small cell lung cancer, squamous cell s/p trial immunotherapy, palliative XRT and chemotherapy with disease progression.  Admitted with concern for HCAP.  Patient has had increasing O2 needs and poor cough clearance.    ASSESSMENT / PLAN:   Acute Respiratory Failure - secondary to effusion, metastatic lung cancer  Right Pleural Effusion - concerning for parapneumonic process (WBC 1156, neutrophil predominant) ? HCAP - strep antigen negative, concern for R infiltrate, no organism specified  Plan:  Push pulmonary hygiene as pt tolerates Vibra vest Q4 while awake Mucomyst Q4 with albuterol x 8 doses  Upright positioning  BiPAP PRN for increased work of breathing PRN morphine / ativan for SOB Oxygen as needed to support sats >90% Intermittent CXR  Continue abx per primary SVC, azactam + vanco Will need ongoing discussions regarding goals  of care.   Cough suppression   Metastatic Non-Small Cell Lung Cancer, Squamous Cell - s/p clinical trial immunotherapy with disease progression, currently on sisplatin, gemcitabine and portrazza s/p 1 cycle  Plan: Per Dr. Herma Mering, NP-C [facility] Pgr: [phone] or if no answer [phone] 06/15/2015, 8:52 AM

## 2015-06-16 ENCOUNTER — Other Ambulatory Visit: Payer: Self-pay

## 2015-06-16 ENCOUNTER — Other Ambulatory Visit: Payer: BLUE CROSS/BLUE SHIELD

## 2015-06-16 ENCOUNTER — Inpatient Hospital Stay (HOSPITAL_COMMUNITY): Payer: BLUE CROSS/BLUE SHIELD

## 2015-06-16 ENCOUNTER — Ambulatory Visit: Payer: BLUE CROSS/BLUE SHIELD

## 2015-06-16 DIAGNOSIS — E43 Unspecified severe protein-calorie malnutrition: Secondary | ICD-10-CM

## 2015-06-16 DIAGNOSIS — C3411 Malignant neoplasm of upper lobe, right bronchus or lung: Secondary | ICD-10-CM

## 2015-06-16 DIAGNOSIS — I871 Compression of vein: Secondary | ICD-10-CM

## 2015-06-16 DIAGNOSIS — C7951 Secondary malignant neoplasm of bone: Secondary | ICD-10-CM

## 2015-06-16 DIAGNOSIS — R0600 Dyspnea, unspecified: Secondary | ICD-10-CM

## 2015-06-16 LAB — BODY FLUID CELL COUNT WITH DIFFERENTIAL
LYMPHS FL: 51 %
Monocyte-Macrophage-Serous Fluid: 14 % — ABNORMAL LOW (ref 50–90)
Neutrophil Count, Fluid: 35 % — ABNORMAL HIGH (ref 0–25)
Total Nucleated Cell Count, Fluid: 635 cu mm (ref 0–1000)

## 2015-06-16 LAB — BASIC METABOLIC PANEL
Anion gap: 10 (ref 5–15)
BUN: 8 mg/dL (ref 6–20)
CO2: 29 mmol/L (ref 22–32)
CREATININE: 0.33 mg/dL — AB (ref 0.61–1.24)
Calcium: 8 mg/dL — ABNORMAL LOW (ref 8.9–10.3)
Chloride: 94 mmol/L — ABNORMAL LOW (ref 101–111)
GFR calc Af Amer: >60 mL/min (ref >60)
GLUCOSE: 113 mg/dL — AB (ref 65–99)
POTASSIUM: 3.9 mmol/L (ref 3.5–5.1)
Sodium: 133 mmol/L — ABNORMAL LOW (ref 135–145)

## 2015-06-16 LAB — CBC
HCT: 28.6 % — ABNORMAL LOW (ref 39.0–52.0)
Hemoglobin: 8.9 g/dL — ABNORMAL LOW (ref 13.0–17.0)
MCH: 27.2 pg (ref 26.0–34.0)
MCHC: 31.1 g/dL (ref 30.0–36.0)
MCV: 87.5 fL (ref 78.0–100.0)
PLATELETS: 552 10*3/uL — AB (ref 150–400)
RBC: 3.27 MIL/uL — AB (ref 4.22–5.81)
RDW: 16.9 % — AB (ref 11.5–15.5)
WBC: 35.7 10*3/uL — ABNORMAL HIGH (ref 4.0–10.5)

## 2015-06-16 MED ORDER — DEXTROSE 5 % IV SOLN
1.0000 g | Freq: Three times a day (TID) | INTRAVENOUS | Status: DC
  Administered 2015-06-16 – 2015-06-22 (×18): 1 g via INTRAVENOUS
  Filled 2015-06-16 (×23): qty 1

## 2015-06-16 NOTE — Progress Notes (Signed)
Patient ID: Bill Valdez, male   DOB: [birth year], 55 y.o.   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 Julien Nordmann who presented to [facility] with reports of subjective fevers, nonproductive cough. He was found to be septic on the admission and was started on broad-spectrum antibiotics for possible pneumonia.  Patient's hospital course was complicated with ongoing hypoxia and need for BiPAP. He was transferred to stepdown 06/12/2015. Currently he is able to keep oxygen saturation above 90% with Ventimask. Appreciate oncology seeing the patient in consultation. Pulmonary is also assisting the management of patient's respiratory status.    Assessment/Plan:    Principal problem:   Acute respiratory failure with hypoxia (HCC) / Sepsis due to pneumonia [facility]) / healthcare associated pneumonia / Leukopenia / Leukocytosis - Sepsis criteria on the admission with presumed source of infection pneumonia, he required transfer to stepdown on 1/7 due to requiring bipap - Patient's chest x-ray most recently 06/12/2015 demonstrated improvement in right pleural effusion after right thoracentesis and improved bilateral lung volumes but heterogeneous opacities throughout the lungs are still persistent and relatively unchanged since prior studies -  He had thoracentesis 06/12/2015 with 1.2 L fluid removed, fluid culture prelim GPC in clusters, final culture pending, on vanc, cytology no malignant cell seen. - He was initially on vancomycin and aztreonam but we added azithromycin 06/11/2015 since pneumonia was not improving and patient continued to be hypoxic. Azithromycin stopped 06/14/2015.  - Appreciate  pulmonary team following and their recommendations  - We will continue symptomatic management with morphine for work of breathing - Blood cultures and respiratory cultures so far negative Strep pneumoniae is negative as well. - Continue to monitor in SDU as patient's oxygen requirements are still pretty high, now on 100% VM and oxygen saturation 93%    Active problems: Interstitial edema / Pleural effusion, right - Has gotten lasix 40 mg IV 1/6 and 1/7 - Status post thoracentesis 06/12/2015 with 1.2 L fluid removed. Fluid culture prelim result GPc in clusters -repeat cxr on 1/11, persistent large right pleural effusion with near confluent insterstitial opacities, stable interstitial densities throughout left lung, repeat thoracentesis? Lasix? To be determined by pulm.  Cancer of upper lobe of right lung ([place]) / Brain metastasis ([place]) / Bone metastasis ([place]) / Edema of upper extremity due to SVC syndrome  - Pt presented with right upper lobe suprahilar mass with questionable SVC syndrome in addition to mediastinal lymphadenopathy with extensive bone and liver metastases in September 2016.  - Has received palliative radiotherapy to the large right upper lobe lung mass followed by treatment with BMS 568 (nivolumab/ipilumumab) at [place], discontinued recently secondary to disease progression. - Pt was on systemic chemotherapy with cisplatin and gemcitabine  - Appreciate Dr. Julien Nordmann seeing the patient in consultation. 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 - Sodium slowly improving from 126 --> 131 on 06/14/2015   Pancytopenia / Anemia of chronic disease  - [place] of chemotherapy and history of malignancy - Hemoglobin is stable at 9.1, platelets are WNL - Check CBC tomorrow am   Severe protein calorie  malnutrition - In the context of  chronic illness - Continue nutritional supplementation  DVT Prophylaxis  - SCD's bilaterally in hospital    Code Status: DNR/DNI Family Communication:  plan of care discussed with the patient and his brother and sister at the bedside Disposition Plan: Remains in step down unit due to high oxygen requirements   IV access:  Peripheral IV  Procedures and diagnostic studies:    Dg Chest 1 View 06/12/2015   No pneumothorax post thoracentesis.  US Thoracentesis Asp Pleural Space W/img Guide 06/12/2015   Successful ultrasound guided right thoracentesis yielding 1200 cc of pleural fluid. After the procedure was performed, just prior to transfer of the patient back to the floor, the patient became tachypneic. His pulse ox saturation droped to 70 percent. [place] became 811 with of systolic blood pressure of 114. The blood pressure was stable. He complained of dyspnea but remain did lucid throughout this episode. Rapid response was called in preparation for endotracheal intubation. The patient and family expressed wishes of do not resuscitate and refused endotracheal intubation. BiPAP support was instituted, then the patient was transferred to the ICU. Electronically Signed   By: Marybelle Killings M.D.   On: 06/12/2015 14:58   Dg Chest Portable 1 View 06/07/2015  1. Volume loss in the right hemithorax with right perihilar mass, right basilar opacity and pleural effusion. No interval exams since staging PET-CT, and findings may reflect a combination of post treatment related change, atelectasis or collapse, or pneumonia. 2. Patchy opacity at the left lung base is nonspecific, pneumonia is favored, however new metastatic disease not excluded radiographically.   Dg Chest Port 1 View 06/11/2015  Worsening diffuse interstitial prominence throughout the lungs, right greater than left. This could represent edema or pneumonia. Focal left lower lobe opacity again noted, stable. Enlarging  moderate right pleural effusion.   Medical Consultants:  None  Other Consultants:  Nutrition   IAnti-Infectives:   Aztreonam and vancomycin 06/07/2015 --> Azithromycin 06/11/2015 --> 06/14/2015   Lashayla Armes, MD PhD [facility] Pager [phone]  Time spent in minutes: 35 minutes  If 7PM-7AM, please contact night-coverage [email or web address] Password [email or web address] 06/16/2015, 9:07 AM   LOS: 9 days    HPI/Subjective: Reported intermittent cough and cancer bone pain, reported feeling somewhat better, Stable on Ventimask.  Objective: Filed Vitals:   06/16/15 0411 06/16/15 0600 06/16/15 0800 06/16/15 0808  BP:  133/62 124/67   Pulse:  110 110   Temp:      TempSrc:      Resp:  20 26   Height:      Weight:      SpO2: 94% 94% 94% 93%    Intake/Output Summary (Last 24 hours) at 06/16/15 0907 Last data filed at 06/16/15 0800  Gross per 24 hour  Intake    845 ml  Output    785 ml  Net     60 ml    Exam:   General:  Patient frail, mild tachypneic on Ventimask,    Cardiovascular: slight sinus tachcardia, appreciate S1, S2   Respiratory: Diminished, coarse breath sounds, no wheezing   Abdomen: (+) BS, non tender   Extremities: UE swelling bilaterally, no leg edema  Neuro: Nonfocal   Data Reviewed: Basic Metabolic Panel:  Recent Labs Lab 06/11/15 0410 06/12/15 0500 06/14/15 1031 06/15/15 0529 06/16/15 0518  NA 127* 128* 131*  --  133*  K 3.7 3.1* 3.5  --  3.9  CL 92* 92* 93*  --  94*  CO2 '27 29 29  '$ --  29  GLUCOSE 128* 107* 100*  --  113*  BUN '9 8 12  '$ --  8  CREATININE <0.30* 0.37* 0.34* 0.44* 0.33*  CALCIUM 7.8* 7.9* 7.9*  --  8.0*   Liver Function Tests: No results for input(s): AST, ALT, ALKPHOS, BILITOT, PROT, ALBUMIN in the last 168 hours. No results for input(s): LIPASE, AMYLASE in the last 168 hours. No results for input(s): AMMONIA in the last 168 hours. CBC:  Recent Labs Lab 06/11/15 0410 06/12/15 0500 06/16/15 0518  WBC 19.8* 28.7* 35.7*  HGB  8.9* 9.1* 8.9*  HCT 26.9* 27.9* 28.6*  MCV 84.1 86.1 87.5  PLT 199 275 552*   Cardiac Enzymes: No results for input(s): CKTOTAL, CKMB, CKMBINDEX, TROPONINI in the last 168 hours. BNP: Invalid input(s): POCBNP CBG: No results for input(s): GLUCAP in the last 168 hours.  Blood Culture (routine x 2)     Status: None   Collection Time: 06/07/15  9:45 PM  Result Value Ref Range Status   Specimen Description BLOOD PORTA CATH  Final   Special Requests BOTTLES DRAWN AEROBIC AND ANAEROBIC 5 ML  Final   Culture   Final    NO GROWTH 5 DAYS Performed at [facility]    Report Status 06/13/2015 FINAL  Final  Blood Culture (routine x 2)     Status: None   Collection Time: 06/07/15  9:46 PM  Result Value Ref Range Status   Specimen Description BLOOD RIGHT ANTECUBITAL  Final   Special Requests BOTTLES DRAWN AEROBIC AND ANAEROBIC 5 ML  Final   Culture   Final    NO GROWTH 5 DAYS Performed at [facility]    Report Status 06/13/2015 FINAL  Final  Urine culture     Status: None   Collection Time: 06/08/15  2:10 AM  Result Value Ref Range Status   Specimen Description URINE, CLEAN CATCH  Final   Special Requests NONE  Final   Culture   Final    NO GROWTH 1 DAY Performed at [facility]    Report Status 06/09/2015 FINAL  Final  Culture, respiratory (NON-Expectorated)     Status: None   Collection Time: 06/08/15 10:20 AM  Result Value Ref Range Status   Specimen Description SPUTUM  Final   Special Requests NONE  Final   Gram Stain   Final   Culture   Final    NORMAL OROPHARYNGEAL FLORA Performed at [facility]    Report Status 06/10/2015 FINAL  Final  Culture, expectorated sputum-assessment     Status: None   Collection Time: 06/08/15 10:20 AM  Result Value Ref Range Status   Specimen Description SPUTUM  Final   Special Requests NONE  Final   Sputum evaluation   Final    THIS SPECIMEN IS ACCEPTABLE. RESPIRATORY CULTURE REPORT TO FOLLOW.   Report  Status 06/08/2015 FINAL  Final  Culture, body fluid-bottle     Status: None (Preliminary result)   Collection Time: 06/12/15  3:11 PM  Result Value Ref Range Status   Specimen Description FLUID RIGHT PLEURAL  Final   Special Requests IN PEDIATRIC BOTTLE 5CC  Final   Gram Stain   Final    GRAM POSITIVE COCCI IN CLUSTERS GRAM POSITIVE RODS CALLED TO EARLY,S RN 06/13/15 2047 [place]    Culture   Final    TOO YOUNG TO READ Performed at [facility]    Report Status PENDING  Incomplete  Gram stain  Status: None   Collection Time: 06/12/15  3:11 PM  Result Value Ref Range Status   Specimen Description FLUID RIGHT PLEURAL  Final   Special Requests NONE  Final   Gram Stain   Final    CYTOSPIN SMEAR WBC PRESENT,BOTH PMN AND MONONUCLEAR NO ORGANISMS SEEN Performed at [facility]    Report Status 06/13/2015 FINAL  Final  MRSA PCR Screening     Status: None   Collection Time: 06/12/15  3:12 PM  Result Value Ref Range Status   MRSA by PCR NEGATIVE NEGATIVE Final     Scheduled Meds: . acetylcysteine  3 mL Nebulization Q4H  . albuterol  2.5 mg Nebulization Q4H  . antiseptic oral rinse  7 mL Mouth Rinse BID  . benzonatate  100 mg Oral BID  . chlorpheniramine-HYDROcodone  10 mL Oral Q12H  . feeding supplement (ENSURE ENLIVE)  237 mL Oral BID BM  . senna-docusate  1 tablet Oral BID  . sodium chloride  10-40 mL Intracatheter Q12H  . vancomycin  1,250 mg Intravenous Q12H   Continuous Infusions: . sodium chloride 10 mL/hr at 06/14/15 0600

## 2015-06-16 NOTE — Progress Notes (Signed)
Name: Bill Valdez MRN: [id number] DOB: 06-04-1961    ADMISSION DATE:  06/07/2015 CONSULTATION DATE:  06/14/15  REFERRING MD :  Dr. Julien Nordmann  CHIEF COMPLAINT:  SOB  SUBJECTIVE:  Back on venti mask Afebrile Good UO   VITAL SIGNS: Temp:  [97.5 F (36.4 C)-98.1 F (36.7 C)] 98.1 F (36.7 C) (01/11 0400) Pulse Rate:  [99-119] 110 (01/11 0800) Resp:  [13-34] 26 (01/11 0800) BP: (115-133)/(55-75) 124/67 mmHg (01/11 0800) SpO2:  [88 %-100 %] 93 % (01/11 0808) FiO2 (%):  [50 %-100 %] 55 % (01/11 0808) Weight:  [157 lb 13.6 oz (71.6 kg)] 157 lb 13.6 oz (71.6 kg) (01/11 0400)  PHYSICAL EXAMINATION: General:  Chronically ill appearing male in NAD, generalized weakness Neuro:  AAOx4, appears weak, speech clear, MAE HEENT:  MM pink/dry, no jvd, trachea deviated to R Cardiovascular:  s1s2 rrr, tachycardia, no m/r/g Lungs:  Even/non-labored, coarse breath sounds, good air movement  Abdomen:  NTND, bsx4 active  Musculoskeletal:  No acute deformities  Skin:  Warm/dry, trace BLE pitting edema    Recent Labs Lab 06/12/15 0500 06/14/15 1031 06/15/15 0529 06/16/15 0518  NA 128* 131*  --  133*  K 3.1* 3.5  --  3.9  CL 92* 93*  --  94*  CO2 29 29  --  29  BUN 8 12  --  8  CREATININE 0.37* 0.34* 0.44* 0.33*  GLUCOSE 107* 100*  --  113*    Recent Labs Lab 06/11/15 0410 06/12/15 0500 06/16/15 0518  HGB 8.9* 9.1* 8.9*  HCT 26.9* 27.9* 28.6*  WBC 19.8* 28.7* 35.7*  PLT 199 275 552*   Dg Chest Port 1 View  06/16/2015  CLINICAL DATA:  Respiratory failure, right upper lung malignancy with distant metastases, healthcare associated pneumonia, sepsis, right pleural effusion EXAM: PORTABLE CHEST 1 VIEW COMPARISON:  Portable chest x-ray of June 14, 2015 FINDINGS: There remains volume loss on the right with large pleural effusion. Interstitial opacities with areas of confluence persist in the right mid and upper lung. On the left there is stable interstitial opacities. There is no left  pleural effusion. The heart is normal in size. The pulmonary vascularity is not engorged. The Port-A-Cath appliance tip projects over the distal third of the SVC. IMPRESSION: Allowing for differences in positioning there has not been significant interval change in the appearance of the chest since the study of 2 days ago. Persistent large right pleural effusion with near confluent interstitial opacities. Stable interstitial densities throughout much of the left lung. Electronically Signed   By: David  Martinique M.D.   On: 06/16/2015 07:12   Dg Chest Port 1 View  06/14/2015  CLINICAL DATA:  Acute respiratory failure. EXAM: PORTABLE CHEST 1 VIEW COMPARISON:  06/12/2015 and CT chest 02/18/2015. FINDINGS: Trachea is midline. Left IJ power port tip projects over the low SVC or SVC RA junction, stable. Right hilar soft tissue prominence, stable. Mixed interstitial and airspace opacification, right greater than left, similar to 06/12/2015. Biapical pleural thickening. Moderate right pleural effusion. Healing lower left lateral rib fracture. IMPRESSION: 1. Low right paratracheal adenopathy. 2. Mixed interstitial and airspace opacification, right greater than left, possibly due to edema or pneumonia, stable 3. Moderate right pleural effusion. Electronically Signed   By: Lorin Picket M.D.   On: 06/14/2015 15:10    SIGNIFICANT EVENTS  01/02  Admit to [facility] 01/09  PCCM consulted for SOB, concern for PNA  STUDIES:  01/07  CXR >> improved effusion post thora, no  pneumothorax 1/9 CXR - rt effusion increased  DISCUSSION:   55 y/o M with known metastatic non-small cell lung cancer, squamous cell s/p trial immunotherapy, palliative XRT and chemotherapy with disease progression.  Admitted with concern for HCAP.  Patient has had increasing O2 needs and poor cough clearance.    ASSESSMENT / PLAN:   Acute Respiratory Failure - secondary to effusion, metastatic lung cancer  Right Pleural Effusion - concerning for  parapneumonic process (WBC 1156, neutrophil predominant), neg cytology ? HCAP - strep antigen negative, concern for R infiltrate, no organism specified Pl fluid 1/7  - GPC clusters  Plan:  Will need rpt thora - concern for empyema but not a candidate for VATS Push pulmonary hygiene as pt tolerates Vibra vest Q4 while awake Mucomyst Q4 with albuterol x 8 doses  Upright positioning  PRN morphine / ativan for SOB Oxygen as needed to support sats >90% Intermittent CXR  Continue abx per primary SVC, azactam + vanco Will need ongoing discussions regarding goals of care.   Cough suppression   Metastatic Non-Small Cell Lung Cancer, Squamous Cell - s/p clinical trial immunotherapy with disease progression, currently on cisplatin, gemcitabine and portrazza s/p 1 cycle  Plan: Per Dr. Andree Moro. MD [id number]  06/16/2015, 9:08 AM

## 2015-06-16 NOTE — Progress Notes (Signed)
Pt placed on BiPAP for respiratory distress.  RT to continue to monitor as needed.

## 2015-06-16 NOTE — Progress Notes (Signed)
Pharmacy Antibiotic Follow-up Note  Bill Valdez is a 55 y.o. year-old male admitted on 06/07/2015.  The patient is currently on day #10 of vancomycin, aztreonam to cefepime 1/11, metronidazole added 1/10, and completed five days of azithromycin for empyema/pneumonia.  On Aztreonam for PCN allergy but this appears to have been a childhood allergy and cephalosporin would be OK based on responses.  Pneumonia is not improving per [facility] notes.  GPC clusters identified in pleural fluid culture - awaiting results,  thoracentesis repeated 1/11.   -WBC is worsening per labs  -SCr stable w/ adequate UOP - afebrile  Assessment/Plan:  Continue vancomycin '1250mg'$  IV q12h   Aztreonam order expired after 10-days of therapy, will change to cefepime 1gm IV q8h after d/w TRH, PCCM, and patient.  Patient relates PCN allergy was a childhood reaction and does not recall what happened.  States he did not have to receive medical treatment and did not have swelling, etc.   Await culture results and hopefully de-escalate antibiotics accordingly  Temp (24hrs), Avg:97.9 F (36.6 C), Min:97.5 F (36.4 C), Max:98.9 F (37.2 C)   Recent Labs Lab 06/11/15 0410 06/12/15 0500 06/16/15 0518  WBC 19.8* 28.7* 35.7*     Recent Labs Lab 06/11/15 0410 06/12/15 0500 06/14/15 1031 06/15/15 0529 06/16/15 0518  CREATININE <0.30* 0.37* 0.34* 0.44* 0.33*   Estimated Creatinine Clearance: 106.9 mL/min (by C-G formula based on Cr of 0.33).    Allergies  Allergen Reactions  . Penicillins Other (See Comments)    Childhood Has patient had a PCN reaction causing immediate rash, facial/tongue/throat swelling, SOB or lightheadedness with hypotension: No. Has patient had a PCN reaction causing severe rash involving mucus membranes or skin necrosis: No Has patient had a PCN reaction that required hospitalization No Has patient had a PCN reaction occurring within the last 10 years: No If all of the above answers are "NO",  then may proceed with Cephalospor    Antimicrobials this admission: 1/2 >> Vanc >> 1/3 >> Azactam >> 1/11 1/6 >> Azithromycin >> 1/10 1/10 >> metronidazole (MD) 1/11 >> cefepime >>  Levels/dose changes this admission: 1/5 0600: VT 8 on '750mg'$  iv q8hr--prior doses charted correctly.  1/6 1930: VT 11 on 1gm IV q8h (missed 12n dose on 1/5) 1/10 0530: VT 26 on '1250mg'$  IV q8h  Microbiology results: 1/2 bloodx2: NGF  1/2 urine: NGF  1/3 sputum: normal flora-final  1/3 Strep pneumo antigen negative  1/7 R pleural fluid: GPC clusters  MRSA PCR neg  Thank you for allowing pharmacy to be a part of this patient's care.  Doreene Eland, PharmD, BCPS.   Pager: [phone] 06/16/2015 11:17 AM

## 2015-06-16 NOTE — Progress Notes (Signed)
CRITICAL VALUE ALERT  Critical value received:  Post Thoracentesis CXR Minimal Right Lateral Pneumothorax  Date of notification:  06/16/2015  Time of notification:  1300  Critical value read back:Yes.    Nurse who received alert:  Gladys Damme, RN  MD notified (1st page):  Dr. Elsworth Soho  Time of first page:  1300  MD notified (2nd page):  Time of second page:  Responding MD:  Noe Gens, NP  Time MD responded:  1320

## 2015-06-16 NOTE — Progress Notes (Addendum)
Called by RN regarding CXR read post thoracentesis.  Small lateral pneumothorax on CXR.     Plan: 100% NRB for now Repeat CXR in 4 hours or sooner if pt develops distress Continue pulmonary hygiene Follow pleural studies   Noe Gens, NP-C [facility] Pgr: [phone] or if no answer [phone] 06/16/2015, 1:22 PM

## 2015-06-16 NOTE — Progress Notes (Signed)
Echocardiogram 2D Echocardiogram has been performed.  Tresa Res 06/16/2015, 1:59 PM

## 2015-06-16 NOTE — Procedures (Signed)
Thoracentesis Procedure Note  Pre-operative Diagnosis: pleural effusion  Post-operative Diagnosis: same  Indications: rt parapneumonic effusion  Procedure Details  Consent: Informed consent was obtained. Risks of the procedure were discussed including: infection, bleeding, pain, pneumothorax.  Under sterile conditions the patient was positioned. Betadine solution and sterile drapes were utilized.  2% buffered lidocaine was used to anesthetize the 7th rib space. Fluid was obtained without any difficulties and minimal blood loss.  A dressing was applied to the wound and wound care instructions were provided.   Findings 1400 ml of clear pleural fluid was obtained.  Complications:  None; patient tolerated the procedure well.          Condition: stable  Plan A follow up chest x-ray was ordered. Bed Rest for 4 hours. Tylenol 650 mg. for pain.  Attending Attestation: I performed the procedure.   Rigoberto Noel MD

## 2015-06-17 ENCOUNTER — Other Ambulatory Visit: Payer: Self-pay

## 2015-06-17 DIAGNOSIS — E871 Hypo-osmolality and hyponatremia: Secondary | ICD-10-CM

## 2015-06-17 DIAGNOSIS — L8990 Pressure ulcer of unspecified site, unspecified stage: Secondary | ICD-10-CM | POA: Insufficient documentation

## 2015-06-17 LAB — BASIC METABOLIC PANEL
ANION GAP: 6 (ref 5–15)
BUN: 7 mg/dL (ref 6–20)
CALCIUM: 8 mg/dL — AB (ref 8.9–10.3)
CO2: 30 mmol/L (ref 22–32)
Chloride: 94 mmol/L — ABNORMAL LOW (ref 101–111)
Creatinine, Ser: 0.31 mg/dL — ABNORMAL LOW (ref 0.61–1.24)
GFR calc non Af Amer: >60 mL/min (ref >60)
Glucose, Bld: 102 mg/dL — ABNORMAL HIGH (ref 65–99)
Potassium: 3.8 mmol/L (ref 3.5–5.1)
SODIUM: 130 mmol/L — AB (ref 135–145)

## 2015-06-17 LAB — CBC
HEMATOCRIT: 28.3 % — AB (ref 39.0–52.0)
Hemoglobin: 9 g/dL — ABNORMAL LOW (ref 13.0–17.0)
MCH: 28.4 pg (ref 26.0–34.0)
MCHC: 31.8 g/dL (ref 30.0–36.0)
MCV: 89.3 fL (ref 78.0–100.0)
PLATELETS: 559 10*3/uL — AB (ref 150–400)
RBC: 3.17 MIL/uL — ABNORMAL LOW (ref 4.22–5.81)
RDW: 17.2 % — AB (ref 11.5–15.5)
WBC: 28.2 10*3/uL — AB (ref 4.0–10.5)

## 2015-06-17 MED ORDER — SENNOSIDES-DOCUSATE SODIUM 8.6-50 MG PO TABS
2.0000 | ORAL_TABLET | Freq: Two times a day (BID) | ORAL | Status: DC
  Administered 2015-06-17 – 2015-06-21 (×7): 2 via ORAL
  Filled 2015-06-17 (×8): qty 2

## 2015-06-17 MED ORDER — POLYETHYLENE GLYCOL 3350 17 G PO PACK
17.0000 g | PACK | Freq: Every day | ORAL | Status: DC
  Administered 2015-06-18 – 2015-06-21 (×3): 17 g via ORAL
  Filled 2015-06-17 (×4): qty 1

## 2015-06-17 NOTE — Progress Notes (Addendum)
Pt seen, currently on NRB, HR109, [id number], spo2 98%.  Pt appears comfortable, no respiratory distress noted or voiced by pt at this time.  CPT done per MD rx without incident.  Bipap not indicated at this time but remains in room on standby.  RT will continue to monitor and assess pt.

## 2015-06-17 NOTE — Progress Notes (Signed)
Patient ID: Bill Valdez, male   DOB: 29-Oct-1960, 55 y.o.   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 Julien Nordmann who presented to [facility] with reports of subjective fevers, nonproductive cough. He was found to be septic on the admission and was started on broad-spectrum antibiotics for possible pneumonia.  Patient's hospital course was complicated with ongoing hypoxia and need for BiPAP. He was transferred to stepdown 06/12/2015. Currently he is able to keep oxygen saturation above 90% with Ventimask. Appreciate oncology seeing the patient in consultation. Pulmonary is also assisting the management of patient's respiratory status.    Assessment/Plan:    Principal problem:   Acute respiratory failure with hypoxia (HCC) / Sepsis due to pneumonia [facility]) / healthcare associated pneumonia / Leukopenia / Leukocytosis - Sepsis criteria on the admission with presumed source of infection pneumonia, he required transfer to stepdown on 1/7 due to requiring bipap - Patient's chest x-ray most recently 06/12/2015 demonstrated improvement in right pleural effusion after right thoracentesis and improved bilateral lung volumes but heterogeneous opacities throughout the lungs are still persistent and relatively unchanged since prior studies -  He had thoracentesis 06/12/2015 with 1.2 L fluid removed, fluid culture from 1/7grow coag negative staph,  vanc d/ed, cytology no malignant cell seen. - He was initially on vancomycin and aztreonam, then added azithromycin 06/11/2015 since pneumonia was not improving and patient continued to be hypoxic. Azithromycin stopped 06/14/2015. Aztreonam stopped on  1/11, cefepime started on 1/11. vanc stopped on 1/12, - Appreciate pulmonary team following and their recommendations  - We will continue symptomatic management with morphine for work of breathing - Blood cultures and respiratory cultures so far negative Strep pneumoniae is negative as well. - Continue to monitor in SDU as patient's oxygen requirements are still pretty high, back on NRB.    Active problems: Interstitial edema / Pleural effusion, right - Has gotten lasix 40 mg IV 1/6 and 1/7 - Status post thoracentesis 06/12/2015 with 1.2 L fluid removed.  -repeat cxr on 1/11, persistent large right pleural effusion with near confluent insterstitial opacities, stable interstitial densities throughout left lung, repeat thoracentesis on 1/11, Lasix?  -appreciate pulmonary input  Cancer of upper lobe of right lung ([place]) / Brain metastasis (HCC) / Bone metastasis ([place]) / Edema of upper extremity due to SVC syndrome  - Pt presented with right upper lobe suprahilar mass with questionable SVC syndrome in addition to mediastinal lymphadenopathy with extensive bone and liver metastases in September 2016.  - Has received palliative radiotherapy to the large right upper lobe lung mass followed by treatment with BMS 568 (nivolumab/ipilumumab) at [place], discontinued recently secondary to disease progression. - Pt was on systemic chemotherapy with cisplatin and gemcitabine  - Appreciate Dr. Julien Nordmann seeing the patient in consultation. 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 and poor oral intake - Sodium slowly improving from 126 --> 131 on 06/14/2015   Pancytopenia / Anemia of chronic disease  - Sequela of chemotherapy and history of malignancy - Hemoglobin is stable at 9.1, platelets are WNL - Check CBC tomorrow am    Severe protein calorie malnutrition - In  the context of chronic illness - Continue nutritional supplementation  DVT Prophylaxis  - SCD's bilaterally in hospital    Code Status: DNR/DNI Family Communication:  plan of care discussed with the patient  Disposition Plan: Remains in step down unit due to high oxygen requirements , pending palliative team input  IV access:  Peripheral IV  Procedures and diagnostic studies:    Dg Chest 1 View 06/12/2015   No pneumothorax post thoracentesis.  US Thoracentesis Asp Pleural Space W/img Guide 06/12/2015   Successful ultrasound guided right thoracentesis yielding 1200 cc of pleural fluid. After the procedure was performed, just prior to transfer of the patient back to the floor, the patient became tachypneic. His pulse ox saturation droped to 70 percent. Poles became 166 with of systolic blood pressure of 114. The blood pressure was stable. He complained of dyspnea but remain did lucid throughout this episode. Rapid response was called in preparation for endotracheal intubation. The patient and family expressed wishes of do not resuscitate and refused endotracheal intubation. BiPAP support was instituted, then the patient was transferred to the ICU. Electronically Signed   By: Marybelle Killings M.D.   On: 06/12/2015 14:58   Dg Chest Portable 1 View 06/07/2015  1. Volume loss in the right hemithorax with right perihilar mass, right basilar opacity and pleural effusion. No interval exams since staging PET-CT, and findings may reflect a combination of post treatment related change, atelectasis or collapse, or pneumonia. 2. Patchy opacity at the left lung base is nonspecific, pneumonia is favored, however new metastatic disease not excluded radiographically.   Dg Chest Port 1 View 06/11/2015  Worsening diffuse interstitial prominence throughout the lungs, right greater than left. This could represent edema or pneumonia. Focal left lower lobe opacity again noted, stable.  Enlarging moderate right pleural effusion.   Medical Consultants:  None  Other Consultants:  Nutrition   IAnti-Infectives:   Aztreonam and vancomycin 06/07/2015 --> Azithromycin 06/11/2015 --> 06/14/2015   Jerline Linzy, MD PhD [facility] Pager [phone]  Time spent in minutes: 35 minutes  If 7PM-7AM, please contact night-coverage [email or web address] Password [email or web address] 06/17/2015, 8:01 AM   LOS: 10 days    HPI/Subjective: Back on NRB, c/o being constipated, agree to talk to palliative care team  Objective: Filed Vitals:   06/17/15 0000 06/17/15 0200 06/17/15 0400 06/17/15 0600  BP: 111/66 106/57 113/58 105/60  Pulse: 101 98 97 95  Temp: 97.4 F (36.3 C)  98.1 F (36.7 C)   TempSrc: Axillary  Axillary   Resp: '21 20 24 20  '$ Height:      Weight:   153 lb (69.4 kg)   SpO2: 97% 98% 97% 100%    Intake/Output Summary (Last 24 hours) at 06/17/15 0801 Last data filed at 06/17/15 1093  Gross per 24 hour  Intake    860 ml  Output   1300 ml  Net   -440 ml    Exam:   General:  Patient frail, mild tachypneic     Cardiovascular: slight sinus tachcardia, appreciate S1, S2   Respiratory: Diminished on the right, no wheezing   Abdomen: (+) BS, non tender   Extremities: UE swelling bilaterally, no leg edema  Neuro: Nonfocal   Data Reviewed: Basic Metabolic Panel:  Recent Labs Lab 06/11/15 0410 06/12/15 0500 06/14/15 1031 06/15/15 0529 06/16/15 0518 06/17/15 0630  NA 127* 128* 131*  --  133* 130*  K 3.7 3.1* 3.5  --  3.9 3.8  CL 92* 92* 93*  --  94*  94*  CO2 '27 29 29  '$ --  29 30  GLUCOSE 128* 107* 100*  --  113* 102*  BUN '9 8 12  '$ --  8 7  CREATININE <0.30* 0.37* 0.34* 0.44* 0.33* 0.31*  CALCIUM 7.8* 7.9* 7.9*  --  8.0* 8.0*   Liver Function Tests: No results for input(s): AST, ALT, ALKPHOS, BILITOT, PROT, ALBUMIN in the last 168 hours. No results for input(s): LIPASE, AMYLASE in the last 168 hours. No results for input(s): AMMONIA in the last 168  hours. CBC:  Recent Labs Lab 06/11/15 0410 06/12/15 0500 06/16/15 0518 06/17/15 0630  WBC 19.8* 28.7* 35.7* 28.2*  HGB 8.9* 9.1* 8.9* 9.0*  HCT 26.9* 27.9* 28.6* 28.3*  MCV 84.1 86.1 87.5 89.3  PLT 199 275 552* 559*   Cardiac Enzymes: No results for input(s): CKTOTAL, CKMB, CKMBINDEX, TROPONINI in the last 168 hours. BNP: Invalid input(s): POCBNP CBG: No results for input(s): GLUCAP in the last 168 hours.  Blood Culture (routine x 2)     Status: None   Collection Time: 06/07/15  9:45 PM  Result Value Ref Range Status   Specimen Description BLOOD PORTA CATH  Final   Special Requests BOTTLES DRAWN AEROBIC AND ANAEROBIC 5 ML  Final   Culture   Final    NO GROWTH 5 DAYS Performed at [facility]    Report Status 06/13/2015 FINAL  Final  Blood Culture (routine x 2)     Status: None   Collection Time: 06/07/15  9:46 PM  Result Value Ref Range Status   Specimen Description BLOOD RIGHT ANTECUBITAL  Final   Special Requests BOTTLES DRAWN AEROBIC AND ANAEROBIC 5 ML  Final   Culture   Final    NO GROWTH 5 DAYS Performed at [facility]    Report Status 06/13/2015 FINAL  Final  Urine culture     Status: None   Collection Time: 06/08/15  2:10 AM  Result Value Ref Range Status   Specimen Description URINE, CLEAN CATCH  Final   Special Requests NONE  Final   Culture   Final    NO GROWTH 1 DAY Performed at [facility]    Report Status 06/09/2015 FINAL  Final  Culture, respiratory (NON-Expectorated)     Status: None   Collection Time: 06/08/15 10:20 AM  Result Value Ref Range Status   Specimen Description SPUTUM  Final   Special Requests NONE  Final   Gram Stain   Final   Culture   Final    NORMAL OROPHARYNGEAL FLORA Performed at [facility]    Report Status 06/10/2015 FINAL  Final  Culture, expectorated sputum-assessment     Status: None   Collection Time: 06/08/15 10:20 AM  Result Value Ref Range Status   Specimen Description SPUTUM   Final   Special Requests NONE  Final   Sputum evaluation   Final    THIS SPECIMEN IS ACCEPTABLE. RESPIRATORY CULTURE REPORT TO FOLLOW.   Report Status 06/08/2015 FINAL  Final  Culture, body fluid-bottle     Status: None (Preliminary result)   Collection Time: 06/12/15  3:11 PM  Result Value Ref Range Status   Specimen Description FLUID RIGHT PLEURAL  Final   Special Requests IN PEDIATRIC BOTTLE 5CC  Final   Gram Stain   Final    GRAM POSITIVE COCCI IN CLUSTERS GRAM POSITIVE RODS CALLED TO EARLY,S RN 06/13/15 2047 [place]    Culture   Final    TOO YOUNG TO  READ Performed at [facility]    Report Status PENDING  Incomplete  Gram stain     Status: None   Collection Time: 06/12/15  3:11 PM  Result Value Ref Range Status   Specimen Description FLUID RIGHT PLEURAL  Final   Special Requests NONE  Final   Gram Stain   Final    CYTOSPIN SMEAR WBC PRESENT,BOTH PMN AND MONONUCLEAR NO ORGANISMS SEEN Performed at [facility]    Report Status 06/13/2015 FINAL  Final  MRSA PCR Screening     Status: None   Collection Time: 06/12/15  3:12 PM  Result Value Ref Range Status   MRSA by PCR NEGATIVE NEGATIVE Final     Scheduled Meds: . antiseptic oral rinse  7 mL Mouth Rinse BID  . benzonatate  100 mg Oral BID  . ceFEPime (MAXIPIME) IV  1 g Intravenous 3 times per day  . chlorpheniramine-HYDROcodone  10 mL Oral Q12H  . feeding supplement (ENSURE ENLIVE)  237 mL Oral BID BM  . senna-docusate  1 tablet Oral BID  . sodium chloride  10-40 mL Intracatheter Q12H  . vancomycin  1,250 mg Intravenous Q12H   Continuous Infusions: . sodium chloride 10 mL/hr at 06/14/15 0600

## 2015-06-17 NOTE — Progress Notes (Signed)
Pt sleeping at this time. CPT therapy is for while awake. 04:00 CPT not provided.

## 2015-06-17 NOTE — Progress Notes (Signed)
Name: Bill Valdez MRN: [id number] DOB: Feb 21, 1961    ADMISSION DATE:  06/07/2015 CONSULTATION DATE:  06/14/15  REFERRING MD :  Dr. Julien Nordmann  CHIEF COMPLAINT:  SOB  DISCUSSION:   55 y/o M with known metastatic non-small cell lung cancer, squamous cell s/p trial immunotherapy, palliative XRT and chemotherapy with disease progression.  Admitted with concern for HCAP.  Patient has had increasing O2 needs and poor cough clearance.  SUBJECTIVE:  On NRB Afebrile Good UO   VITAL SIGNS: Temp:  [97.4 F (36.3 C)-100.9 F (38.3 C)] 97.8 F (36.6 C) (01/12 0800) Pulse Rate:  [95-122] 103 (01/12 0800) Resp:  [20-36] 23 (01/12 0800) BP: (105-132)/(57-90) 122/69 mmHg (01/12 0800) SpO2:  [86 %-100 %] 91 % (01/12 0800) FiO2 (%):  [55 %-100 %] 100 % (01/11 2104) Weight:  [153 lb (69.4 kg)] 153 lb (69.4 kg) (01/12 0400)  PHYSICAL EXAMINATION: General:  Chronically ill appearing male in NAD, generalized weakness Neuro:  AAOx4, appears weak, speech clear, MAE HEENT:  MM pink/dry, no jvd, trachea deviated to R Cardiovascular:  s1s2 rrr, tachycardia, no m/r/g Lungs:  Even/non-labored, coarse breath sounds, good air movement  Abdomen:  NTND, bsx4 active  Musculoskeletal:  No acute deformities  Skin:  Warm/dry, trace BLE pitting edema    Recent Labs Lab 06/14/15 1031 06/15/15 0529 06/16/15 0518 06/17/15 0630  NA 131*  --  133* 130*  K 3.5  --  3.9 3.8  CL 93*  --  94* 94*  CO2 29  --  29 30  BUN 12  --  8 7  CREATININE 0.34* 0.44* 0.33* 0.31*  GLUCOSE 100*  --  113* 102*    Recent Labs Lab 06/12/15 0500 06/16/15 0518 06/17/15 0630  HGB 9.1* 8.9* 9.0*  HCT 27.9* 28.6* 28.3*  WBC 28.7* 35.7* 28.2*  PLT 275 552* 559*   Dg Chest Port 1 View  06/16/2015  CLINICAL DATA:  Followup right pneumothorax. Metastatic right lung carcinoma. Healthcare associated pneumonia and sepsis. EXAM: PORTABLE CHEST 1 VIEW COMPARISON:  04/15/2016 FINDINGS: Tiny less than 10% right lateral  pneumothorax again demonstrated as well as small right pleural effusion. Asymmetric airspace disease is again seen involving right lung greater than left, without significant change. Heart size is stable. Left-sided power port remains in appropriate position. IMPRESSION: Stable small right hydropneumothorax. No significant change in asymmetric airspace disease. Electronically Signed   By: Earle Gell M.D.   On: 06/16/2015 16:02   Dg Chest Port 1 View  06/16/2015  CLINICAL DATA:  Status post thoracentesis EXAM: PORTABLE CHEST - 1 VIEW COMPARISON:  06/16/2015 FINDINGS: Cardiac shadow is stable. A left chest wall port is again seen. There is been interval right-sided thoracentesis. Minimal right lateral pneumothorax is noted. Diffuse infiltrates are noted throughout the right lung as well as some diffuse interstitial changes in the left lung. This is stable from the prior study. IMPRESSION: Minimal lateral pneumothorax following thoracentesis. There has been significant reduction in pleural fluid when compared with the prior exam. These results were called by telephone at the time of interpretation on 06/16/2015 at 12:27 pm to [facility] of the patients nurse, who verbally acknowledged these results and will notify the performing physician. Electronically Signed   By: Inez Catalina M.D.   On: 06/16/2015 12:29   Dg Chest Port 1 View  06/16/2015  CLINICAL DATA:  Respiratory failure, right upper lung malignancy with distant metastases, healthcare associated pneumonia, sepsis, right pleural effusion EXAM: PORTABLE CHEST 1 VIEW COMPARISON:  Portable chest x-ray of June 14, 2015 FINDINGS: There remains volume loss on the right with large pleural effusion. Interstitial opacities with areas of confluence persist in the right mid and upper lung. On the left there is stable interstitial opacities. There is no left pleural effusion. The heart is normal in size. The pulmonary vascularity is not engorged. The Port-A-Cath appliance  tip projects over the distal third of the SVC. IMPRESSION: Allowing for differences in positioning there has not been significant interval change in the appearance of the chest since the study of 2 days ago. Persistent large right pleural effusion with near confluent interstitial opacities. Stable interstitial densities throughout much of the left lung. Electronically Signed   By: David  Martinique M.D.   On: 06/16/2015 07:12    SIGNIFICANT EVENTS  01/02  Admit to [facility] 01/09  PCCM consulted for SOB, concern for PNA 1/7 thora 1.2 L 1/11 thora 1.4 L  STUDIES:  01/07  CXR >> improved effusion post thora, no pneumothorax 1/9 CXR - rt effusion increased 1/11 small ex vacuo pnthx s/p thora      ASSESSMENT / PLAN:   Acute Respiratory Failure - secondary to effusion, metastatic lung cancer  Right Pleural Effusion - concerning for parapneumonic process (WBC 1156, neutrophil predominant), neg cytology, s/p thora x 2 ? HCAP - strep antigen negative, concern for R infiltrate, no organism specified Pl fluid 1/7  - coag neg staph  Small ex vacuo pneumothorax 1/11  Plan:  Push pulmonary hygiene as pt tolerates Chest PT bid  while awake Upright positioning  PRN morphine / ativan for SOB Oxygen as needed to support sats >90% Intermittent CXR  Continue abx per primary SVC, cefepime + flagyl, dc vanco Will need ongoing discussions regarding goals of care - low dose morphie OK for shortness of breath Cough suppression   Metastatic Non-Small Cell Lung Cancer, Squamous Cell - s/p clinical trial immunotherapy with disease progression, currently on cisplatin, gemcitabine and portrazza s/p 1 cycle  Plan: D/w  Dr. Andree Moro. MD [phone]  06/17/2015, 9:09 AM

## 2015-06-17 NOTE — Progress Notes (Signed)
Pt sleeping at this time.  CPT therapy is for while awake.  Midnight CPT not provided.

## 2015-06-17 NOTE — Progress Notes (Signed)
Date: June 17, 2015 Chart reviewed for concurrent status and case management needs. Will continue to follow patient for changes and needs:  o2 with nrb mask at 40-100% still required. Velva Harman, RN, BSN, [place]   [phone]

## 2015-06-18 ENCOUNTER — Inpatient Hospital Stay (HOSPITAL_COMMUNITY): Payer: BLUE CROSS/BLUE SHIELD

## 2015-06-18 DIAGNOSIS — Z789 Other specified health status: Secondary | ICD-10-CM

## 2015-06-18 DIAGNOSIS — C7931 Secondary malignant neoplasm of brain: Secondary | ICD-10-CM

## 2015-06-18 LAB — CBC
HCT: 29 % — ABNORMAL LOW (ref 39.0–52.0)
HEMOGLOBIN: 9.2 g/dL — AB (ref 13.0–17.0)
MCH: 28.4 pg (ref 26.0–34.0)
MCHC: 31.7 g/dL (ref 30.0–36.0)
MCV: 89.5 fL (ref 78.0–100.0)
Platelets: 620 10*3/uL — ABNORMAL HIGH (ref 150–400)
RBC: 3.24 MIL/uL — AB (ref 4.22–5.81)
RDW: 17.4 % — ABNORMAL HIGH (ref 11.5–15.5)
WBC: 28.9 10*3/uL — ABNORMAL HIGH (ref 4.0–10.5)

## 2015-06-18 LAB — COMPREHENSIVE METABOLIC PANEL
ALBUMIN: 1.6 g/dL — AB (ref 3.5–5.0)
ALK PHOS: 129 U/L — AB (ref 38–126)
ALT: 14 U/L — AB (ref 17–63)
AST: 27 U/L (ref 15–41)
Anion gap: 8 (ref 5–15)
BUN: 8 mg/dL (ref 6–20)
CALCIUM: 8.1 mg/dL — AB (ref 8.9–10.3)
CO2: 28 mmol/L (ref 22–32)
Chloride: 94 mmol/L — ABNORMAL LOW (ref 101–111)
GLUCOSE: 101 mg/dL — AB (ref 65–99)
Potassium: 4 mmol/L (ref 3.5–5.1)
SODIUM: 130 mmol/L — AB (ref 135–145)
Total Bilirubin: 0.7 mg/dL (ref 0.3–1.2)
Total Protein: 5.5 g/dL — ABNORMAL LOW (ref 6.5–8.1)

## 2015-06-18 LAB — TSH: TSH: 1.824 u[IU]/mL (ref 0.350–4.500)

## 2015-06-18 LAB — CULTURE, BODY FLUID-BOTTLE

## 2015-06-18 LAB — MAGNESIUM: Magnesium: 1.8 mg/dL (ref 1.7–2.4)

## 2015-06-18 LAB — CULTURE, BODY FLUID W GRAM STAIN -BOTTLE

## 2015-06-18 MED ORDER — MORPHINE SULFATE 15 MG PO TABS
7.5000 mg | ORAL_TABLET | Freq: Four times a day (QID) | ORAL | Status: DC
  Administered 2015-06-18 – 2015-06-21 (×11): 7.5 mg via ORAL
  Filled 2015-06-18 (×12): qty 1

## 2015-06-18 MED ORDER — MORPHINE SULFATE (PF) 2 MG/ML IV SOLN
1.0000 mg | INTRAVENOUS | Status: DC | PRN
  Administered 2015-06-20 (×2): 1 mg via INTRAVENOUS
  Administered 2015-06-21: 2 mg via INTRAVENOUS
  Filled 2015-06-18 (×4): qty 1

## 2015-06-18 MED ORDER — METRONIDAZOLE IN NACL 5-0.79 MG/ML-% IV SOLN
500.0000 mg | Freq: Three times a day (TID) | INTRAVENOUS | Status: DC
  Administered 2015-06-18 – 2015-06-22 (×12): 500 mg via INTRAVENOUS
  Filled 2015-06-18 (×11): qty 100

## 2015-06-18 MED ORDER — FUROSEMIDE 10 MG/ML IJ SOLN
40.0000 mg | Freq: Every day | INTRAMUSCULAR | Status: DC
  Administered 2015-06-18 – 2015-06-21 (×4): 40 mg via INTRAVENOUS
  Filled 2015-06-18 (×5): qty 4

## 2015-06-18 NOTE — Progress Notes (Signed)
Pharmacy Antibiotic Follow-up Note  Bill Valdez is a 55 y.o. year-old male admitted on 06/07/2015.  The patient has been on antibiotics since 06/07/15 but is currently on day #3 Cefepime (changed from aztreonam 1/11) with Metronidazole added today (1/13) for empyema/pneumonia.  S/p thoracentesis 1/7 and 1/11 - first fluid collection grew coagulase negative Staph on culture, second collection no growth to date.  Assessment/Plan: - Continue Cefepime 1gm IV q8h. Patient is tolerating Cefepime.  No reactions and no issues in relation to PCN childhood allergy. - Flagyl 500 mg IV q8h placed for PCCM today for more anaerobic coverage for empyema  Temp (24hrs), Avg:98.1 F (36.7 C), Min:97.6 F (36.4 C), Max:98.6 F (37 C)   Recent Labs Lab 06/12/15 0500 06/16/15 0518 06/17/15 0630 06/18/15 0515  WBC 28.7* 35.7* 28.2* 28.9*     Recent Labs Lab 06/14/15 1031 06/15/15 0529 06/16/15 0518 06/17/15 0630 06/18/15 0515  CREATININE 0.34* 0.44* 0.33* 0.31* <0.30*   CrCl cannot be calculated (Patient has no serum creatinine result on file.).    Allergies  Allergen Reactions  . Penicillins Other (See Comments)    Childhood Has patient had a PCN reaction causing immediate rash, facial/tongue/throat swelling, SOB or lightheadedness with hypotension: No. Has patient had a PCN reaction causing severe rash involving mucus membranes or skin necrosis: No Has patient had a PCN reaction that required hospitalization No Has patient had a PCN reaction occurring within the last 10 years: No If all of the above answers are "NO", then may proceed with Cephalospor    Antimicrobials this admission: 1/2 >> Vanc >> 1/12 1/3 >> Azactam >> 1/11 1/6 >> Azithromycin >> 1/10 1/11 >> Cefepime >> 1/13 >> Flagyl >>  Levels/dose changes this admission: 1/5 0600: VT 8 on '750mg'$  iv q8hr--prior doses charted correctly.  1/6 1930: VT 11 on 1gm IV q8h (missed 12n dose on 1/5) 1/10 0530: VT 26 on '1250mg'$  IV  q8h  Microbiology results: 1/2 bloodx2: NGF 1/2 urine: NGF 1/3 sputum: normal flora-final 1/3 Strep pneumo antigen negative 1/7 R pleural fluid: CoNS, gram variable rod 1/11 pleural fluid: ngtd MRSA PCR neg  Thank you for allowing pharmacy to be a part of this patient's care.  Hershal Coria, PharmD, BCPS Pager: [phone] 06/18/2015 1:52 PM

## 2015-06-18 NOTE — Progress Notes (Addendum)
Pt appears to be resting comfortably, no respiratory distress or increased wob noted.  HR104, RR19, spo2 97% on NRB.  Bipap not indicated at this time but remains in room on standby.

## 2015-06-18 NOTE — Progress Notes (Signed)
Pt refused cpt tonight, stating that he was too tired and already did it once today and that was enough.  EQ337, rr25, spo2 96% on 55% venturi mask.  No increased wob or sob noted or voiced by pt at this time.  Bipap remains in room on standy if needed.  RT will continue to monitor and assess pt.

## 2015-06-18 NOTE — Progress Notes (Addendum)
Name: Bill Valdez MRN: [id number] DOB: 1960-11-16    ADMISSION DATE:  06/07/2015 CONSULTATION DATE:  06/14/15  REFERRING MD :  Dr. Julien Nordmann  CHIEF COMPLAINT:  SOB  DISCUSSION:   55 y/o M with known metastatic non-small cell lung cancer, squamous cell s/p trial immunotherapy, palliative XRT and chemotherapy with disease progression.  Admitted with concern for HCAP.  Patient has had increasing O2 needs and poor cough clearance.  SUBJECTIVE:  On NRB - breathing mildly better C/o cough Afebrile    VITAL SIGNS: Temp:  [97.7 F (36.5 C)-98.6 F (37 C)] 97.7 F (36.5 C) (01/13 0809) Pulse Rate:  [101-114] 114 (01/13 0800) Resp:  [6-28] 25 (01/13 0800) BP: (101-119)/(60-74) 106/66 mmHg (01/13 0800) SpO2:  [89 %-100 %] 94 % (01/13 0800) FiO2 (%):  [55 %] 55 % (01/13 0800) Weight:  [154 lb 5.2 oz (70 kg)] 154 lb 5.2 oz (70 kg) (01/13 0400)  PHYSICAL EXAMINATION: General:  Chronically ill appearing male in NAD, generalized weakness Neuro:  AAOx4, appears weak, speech clear, MAE HEENT:  MM pink/dry, no jvd, trachea deviated to R Cardiovascular:  s1s2 rrr, tachycardia, no m/r/g Lungs:  Even/non-labored, coarse breath sounds, good air movement  Abdomen:  NTND, bsx4 active  Musculoskeletal:  No acute deformities  Skin:  Warm/dry, trace BLE pitting edema    Recent Labs Lab 06/16/15 0518 06/17/15 0630 06/18/15 0515  NA 133* 130* 130*  K 3.9 3.8 4.0  CL 94* 94* 94*  CO2 '29 30 28  '$ BUN '8 7 8  '$ CREATININE 0.33* 0.31* <0.30*  GLUCOSE 113* 102* 101*    Recent Labs Lab 06/16/15 0518 06/17/15 0630 06/18/15 0515  HGB 8.9* 9.0* 9.2*  HCT 28.6* 28.3* 29.0*  WBC 35.7* 28.2* 28.9*  PLT 552* 559* 620*   Dg Chest Port 1 View  06/16/2015  CLINICAL DATA:  Followup right pneumothorax. Metastatic right lung carcinoma. Healthcare associated pneumonia and sepsis. EXAM: PORTABLE CHEST 1 VIEW COMPARISON:  04/15/2016 FINDINGS: Tiny less than 10% right lateral pneumothorax again  demonstrated as well as small right pleural effusion. Asymmetric airspace disease is again seen involving right lung greater than left, without significant change. Heart size is stable. Left-sided power port remains in appropriate position. IMPRESSION: Stable small right hydropneumothorax. No significant change in asymmetric airspace disease. Electronically Signed   By: Earle Gell M.D.   On: 06/16/2015 16:02   Dg Chest Port 1 View  06/16/2015  CLINICAL DATA:  Status post thoracentesis EXAM: PORTABLE CHEST - 1 VIEW COMPARISON:  06/16/2015 FINDINGS: Cardiac shadow is stable. A left chest wall port is again seen. There is been interval right-sided thoracentesis. Minimal right lateral pneumothorax is noted. Diffuse infiltrates are noted throughout the right lung as well as some diffuse interstitial changes in the left lung. This is stable from the prior study. IMPRESSION: Minimal lateral pneumothorax following thoracentesis. There has been significant reduction in pleural fluid when compared with the prior exam. These results were called by telephone at the time of interpretation on 06/16/2015 at 12:27 pm to [facility] of the patients nurse, who verbally acknowledged these results and will notify the performing physician. Electronically Signed   By: Inez Catalina M.D.   On: 06/16/2015 12:29    SIGNIFICANT EVENTS  01/02  Admit to [facility] 01/09  PCCM consulted for SOB, concern for PNA 1/7 thora 1.2 L 1/11 thora 1.4 L  STUDIES:  01/07  CXR >> improved effusion post thora, no pneumothorax 1/9 CXR - rt effusion increased 1/11 small  ex vacuo pnthx s/p thora      ASSESSMENT / PLAN:   Acute Respiratory Failure - secondary to effusion, metastatic lung cancer  Right Pleural Effusion - concerning for parapneumonic process (WBC 1156, neutrophil predominant), neg cytology, s/p thora x 2 ? HCAP - strep antigen negative, concern for R infiltrate, no organism specified Pl fluid 1/7  - coag neg staph  Small ex vacuo  pneumothorax 1/11  Plan:  Lasix 40 daily Chest PT bid  while awake Upright positioning  PRN morphine / ativan for SOB Oxygen as needed to support sats >90% - dial down to venti mask Intermittent CXR  Continue abx per primary SVC, cefepime + flagyl, dc vanco Will need ongoing discussions regarding goals of care - low dose morphie OK for shortness of breath Cough suppression with codeine  Metastatic Non-Small Cell Lung Cancer, Squamous Cell - s/p clinical trial immunotherapy with disease progression, currently on cisplatin, gemcitabine and portrazza s/p 1 cycle  Plan: D/w  Dr. Cherrie Distance term prognosis guarded, hope that hypoxia will improve with abx PCCM to see once over weekend  [facility] V. MD [phone]  06/18/2015, 9:26 AM

## 2015-06-18 NOTE — Progress Notes (Signed)
Pt is asleep, appears to be resting comfortably, no respiratory distress noted at this time.  Pt remains on NRB, HR107, RR21, spo2 98%.  Bipap remains in room on standby.

## 2015-06-18 NOTE — Progress Notes (Signed)
Patient ID: Bill Valdez, male   DOB: May 30, 1961, 55 y.o.   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 Julien Nordmann who presented to [facility] with reports of subjective fevers, nonproductive cough. He was found to be septic on the admission and was started on broad-spectrum antibiotics for possible pneumonia.  Patient's hospital course was complicated with ongoing hypoxia and need for BiPAP. He was transferred to stepdown 06/12/2015. Currently he is able to keep oxygen saturation above 90% with Ventimask. Appreciate oncology seeing the patient in consultation. Pulmonary is also assisting the management of patient's respiratory status.    Assessment/Plan:    Principal problem:   Acute respiratory failure with hypoxia (HCC) / Sepsis due to pneumonia [facility]) / healthcare associated pneumonia / Leukopenia / Leukocytosis - Sepsis criteria on the admission with presumed source of infection pneumonia, he required transfer to stepdown on 1/7 due to requiring bipap - Patient's chest x-ray most recently 06/12/2015 demonstrated improvement in right pleural effusion after right thoracentesis and improved bilateral lung volumes but heterogeneous opacities throughout the lungs are still persistent and relatively unchanged since prior studies -  He had thoracentesis 06/12/2015 with 1.2 L fluid removed, fluid culture from 1/7grow coag negative staph,  vanc d/ed, cytology no malignant cell seen. - He was initially on vancomycin and aztreonam, then added azithromycin 06/11/2015 since pneumonia was not improving and patient continued to be hypoxic. Azithromycin stopped 06/14/2015. Aztreonam stopped on  1/11, cefepime started on 1/11. vanc stopped on 1/12, - Appreciate pulmonary team following and their recommendations  - We will continue symptomatic management with morphine for work of breathing - Blood cultures and respiratory cultures so far negative Strep pneumoniae is negative as well. - Continue to monitor in SDU as patient's oxygen requirements are still pretty high, back on NRB.    Active problems: Interstitial edema / Pleural effusion, right - Has gotten lasix 40 mg IV 1/6 and 1/7 - Status post thoracentesis 06/12/2015 with 1.2 L fluid removed.  -repeat cxr on 1/11, persistent large right pleural effusion with near confluent insterstitial opacities, stable interstitial densities throughout left lung, repeat thoracentesis on 1/11, -trial of lasix on 1/13, appreciate pulmonary input  Cancer of upper lobe of right lung (HCC) / Brain metastasis (HCC) / Bone metastasis ([place]) / Edema of upper extremity due to SVC syndrome  - Pt presented with right upper lobe suprahilar mass with questionable SVC syndrome in addition to mediastinal lymphadenopathy with extensive bone and liver metastases in September 2016.  - Has received palliative radiotherapy to the large right upper lobe lung mass followed by treatment with BMS 568 (nivolumab/ipilumumab) at [place], discontinued recently secondary to disease progression. - Pt was on systemic chemotherapy with cisplatin and gemcitabine  - Appreciate Dr. Julien Nordmann seeing the patient in consultation. Due to worsening respiratory status, clinically poor prognosis oncology recommend palliative care, possible hospice  -palliative care consulted, meeting today at noon.  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 and poor oral intake - Sodium slowly improving from 126 --> 131 on 06/14/2015   Pancytopenia / Anemia of chronic disease  - [place]ela of chemotherapy and history of malignancy - Hemoglobin is stable at 9.1, platelets  are WNL - Check CBC tomorrow am  Severe protein calorie malnutrition - In the context of chronic illness - Continue nutritional supplementation  DVT Prophylaxis  - SCD's bilaterally in hospital    Code Status: DNR/DNI Family Communication:  plan of care discussed with the patient  Disposition Plan: Remains in step down unit due to high oxygen requirements , pending palliative team input  IV access:  Peripheral IV  Procedures and diagnostic studies:    Dg Chest 1 View 06/12/2015   No pneumothorax post thoracentesis.  US Thoracentesis Asp Pleural Space W/img Guide 06/12/2015   Successful ultrasound guided right thoracentesis yielding 1200 cc of pleural fluid. After the procedure was performed, just prior to transfer of the patient back to the floor, the patient became tachypneic. His pulse ox saturation droped to 70 percent. [place] became 063 with of systolic blood pressure of 114. The blood pressure was stable. He complained of dyspnea but remain did lucid throughout this episode. Rapid response was called in preparation for endotracheal intubation. The patient and family expressed wishes of do not resuscitate and refused endotracheal intubation. BiPAP support was instituted, then the patient was transferred to the ICU. Electronically Signed   By: Marybelle Killings M.D.   On: 06/12/2015 14:58   Dg Chest Portable 1 View 06/07/2015  1. Volume loss in the right hemithorax with right perihilar mass, right basilar opacity and pleural effusion. No interval exams since staging PET-CT, and findings may reflect a combination of post treatment related change, atelectasis or collapse, or pneumonia. 2. Patchy opacity at the left lung base is nonspecific, pneumonia is favored, however new metastatic disease not excluded radiographically.   Dg Chest Port 1 View 06/11/2015  Worsening diffuse interstitial prominence throughout the lungs, right greater than left. This could represent edema or pneumonia. Focal left lower  lobe opacity again noted, stable. Enlarging moderate right pleural effusion.   Medical Consultants:  None  Other Consultants:  Nutrition   IAnti-Infectives:   Aztreonam and vancomycin 06/07/2015 --> Azithromycin 06/11/2015 --> 06/14/2015   Peggy Monk, MD PhD [facility] Pager [phone]  Time spent in minutes: 25 minutes  If 7PM-7AM, please contact night-coverage [email or web address] Password [facility] 06/18/2015, 7:46 AM   LOS: 11 days    HPI/Subjective: Back on NRB, c/o being constipated, agree to talk to palliative care team  Objective: Filed Vitals:   06/18/15 0000 06/18/15 0400 06/18/15 0600 06/18/15 0730  BP: 101/70  119/68   Pulse: 110  108 114  Temp: 98.2 F (36.8 C) 98.6 F (37 C)    TempSrc: Axillary Axillary    Resp: '26  24 21  '$ Height:      Weight:  70 kg (154 lb 5.2 oz)    SpO2: 98%  93% 89%    Intake/Output Summary (Last 24 hours) at 06/18/15 0746 Last data filed at 06/18/15 0544  Gross per 24 hour  Intake  605.5 ml  Output   1050 ml  Net -444.5 ml    Exam:   General:  Patient frail, mild tachypneic     Cardiovascular: slight sinus tachcardia, appreciate S1, S2   Respiratory: Diminished on the right, no wheezing   Abdomen: (+) BS, non tender   Extremities: UE swelling bilaterally, no leg edema  Neuro: Nonfocal   Data Reviewed: Basic Metabolic Panel:  Recent Labs Lab 06/12/15 0500 06/14/15 1031 06/15/15 0529 06/16/15 0518 06/17/15 0630 06/18/15 0515  NA 128* 131*  --  133* 130* 130*  K 3.1* 3.5  --  3.9 3.8 4.0  CL 92* 93*  --  94* 94* 94*  CO2 29 29  --  '29 30 28  '$ GLUCOSE 107* 100*  --  113* 102* 101*  BUN 8 12  --  '8 7 8  '$ CREATININE 0.37* 0.34* 0.44* 0.33* 0.31* <0.30*  CALCIUM 7.9* 7.9*  --  8.0* 8.0* 8.1*  MG  --   --   --   --   --  1.8   Liver Function Tests:  Recent Labs Lab 06/18/15 0515  AST 27  ALT 14*  ALKPHOS 129*  BILITOT 0.7  PROT 5.5*  ALBUMIN 1.6*   No results for input(s): LIPASE, AMYLASE in the last 168  hours. No results for input(s): AMMONIA in the last 168 hours. CBC:  Recent Labs Lab 06/12/15 0500 06/16/15 0518 06/17/15 0630 06/18/15 0515  WBC 28.7* 35.7* 28.2* 28.9*  HGB 9.1* 8.9* 9.0* 9.2*  HCT 27.9* 28.6* 28.3* 29.0*  MCV 86.1 87.5 89.3 89.5  PLT 275 552* 559* 620*   Cardiac Enzymes: No results for input(s): CKTOTAL, CKMB, CKMBINDEX, TROPONINI in the last 168 hours. BNP: Invalid input(s): POCBNP CBG: No results for input(s): GLUCAP in the last 168 hours.  Blood Culture (routine x 2)     Status: None   Collection Time: 06/07/15  9:45 PM  Result Value Ref Range Status   Specimen Description BLOOD PORTA CATH  Final   Special Requests BOTTLES DRAWN AEROBIC AND ANAEROBIC 5 ML  Final   Culture   Final    NO GROWTH 5 DAYS Performed at [facility]    Report Status 06/13/2015 FINAL  Final  Blood Culture (routine x 2)     Status: None   Collection Time: 06/07/15  9:46 PM  Result Value Ref Range Status   Specimen Description BLOOD RIGHT ANTECUBITAL  Final   Special Requests BOTTLES DRAWN AEROBIC AND ANAEROBIC 5 ML  Final   Culture   Final    NO GROWTH 5 DAYS Performed at [facility]    Report Status 06/13/2015 FINAL  Final  Urine culture     Status: None   Collection Time: 06/08/15  2:10 AM  Result Value Ref Range Status   Specimen Description URINE, CLEAN CATCH  Final   Special Requests NONE  Final   Culture   Final    NO GROWTH 1 DAY Performed at [facility]    Report Status 06/09/2015 FINAL  Final  Culture, respiratory (NON-Expectorated)     Status: None   Collection Time: 06/08/15 10:20 AM  Result Value Ref Range Status   Specimen Description SPUTUM  Final   Special Requests NONE  Final   Gram Stain   Final   Culture   Final    NORMAL OROPHARYNGEAL FLORA Performed at [facility]    Report Status 06/10/2015 FINAL  Final  Culture, expectorated sputum-assessment     Status: None   Collection Time: 06/08/15 10:20 AM   Result Value Ref Range Status   Specimen Description SPUTUM  Final   Special Requests NONE  Final   Sputum evaluation   Final    THIS SPECIMEN IS ACCEPTABLE. RESPIRATORY CULTURE REPORT TO FOLLOW.   Report Status 06/08/2015 FINAL  Final  Culture, body fluid-bottle     Status: None (Preliminary result)   Collection Time: 06/12/15  3:11 PM  Result Value Ref Range Status   Specimen Description FLUID RIGHT PLEURAL  Final   Special Requests IN PEDIATRIC BOTTLE 5CC  Final   Gram Stain   Final  GRAM POSITIVE COCCI IN CLUSTERS GRAM POSITIVE RODS CALLED TO EARLY,S RN 06/13/15 2047 [place]    Culture   Final    TOO YOUNG TO READ Performed at [facility]    Report Status PENDING  Incomplete  Gram stain     Status: None   Collection Time: 06/12/15  3:11 PM  Result Value Ref Range Status   Specimen Description FLUID RIGHT PLEURAL  Final   Special Requests NONE  Final   Gram Stain   Final    CYTOSPIN SMEAR WBC PRESENT,BOTH PMN AND MONONUCLEAR NO ORGANISMS SEEN Performed at [facility]    Report Status 06/13/2015 FINAL  Final  MRSA PCR Screening     Status: None   Collection Time: 06/12/15  3:12 PM  Result Value Ref Range Status   MRSA by PCR NEGATIVE NEGATIVE Final     Scheduled Meds: . antiseptic oral rinse  7 mL Mouth Rinse BID  . benzonatate  100 mg Oral BID  . ceFEPime (MAXIPIME) IV  1 g Intravenous 3 times per day  . chlorpheniramine-HYDROcodone  10 mL Oral Q12H  . feeding supplement (ENSURE ENLIVE)  237 mL Oral BID BM  . furosemide  40 mg Intravenous Daily  . polyethylene glycol  17 g Oral Daily  . senna-docusate  2 tablet Oral BID  . sodium chloride  10-40 mL Intracatheter Q12H   Continuous Infusions: . sodium chloride 10 mL/hr at 06/14/15 0600

## 2015-06-18 NOTE — Consult Note (Signed)
Consultation Note Date: 06/18/2015   Patient Name: Bill Valdez  DOB: 1961/02/24  MRN: [id number]  Age / Sex: 55 y.o., male  PCP: Gaynelle Arabian, MD Referring Physician: Florencia Reasons, MD  Reason for Consultation: Establishing goals of care  Clinical Assessment/Narrative: 55 yo with advanced lung cancer, s/p clinical trials and disease progression despite maximal interventions. Now in ICU at [facility] with parapneumonic effusions and overall functional status decline.  Multiple poor prognostic factors: Low albumin <1.6. Mild hypercalcemia, respiratory failure hypoxemia, minimal improvement with aggressive interventions, thoracentesis X2, refractory PNA/HCAP-->metastatic disease.  Patient was open to talking about EOL issues- he appears to be coping well but there is clearly some fear associated with taking the next step towards "full comfort care". He says he wanted to be able to do this "on his own terms". He asked about assisted suicide (had hoped to be able to do this) and about sedation and pain medication dosing that I recommended. I provided reassurance that we would not give him any more or any less than what he needed for comfort. I asked him about ICU level care and he said the "alarms and monitor" are his only QOL right now and that he "kind of liked them". He is conflicted right now about transitioning- he is hopeful he can go home-unclear if this will even be possible. He expressed gratitude for the conversation.  Contacts/Participants in Discussion: Primary Decision Maker: self   Relationship to Patient self HCPOA: unknown   SUMMARY OF RECOMMENDATIONS  Code Status/Advance Care Planning: DNR    Code Status Orders        Start     Ordered   06/12/15 1357  Do not attempt resuscitation (DNR)   Continuous    Question Answer Comment  In the event of cardiac or respiratory ARREST Do not call a "code blue"   In the  event of cardiac or respiratory ARREST Do not perform Intubation, CPR, defibrillation or ACLS   In the event of cardiac or respiratory ARREST Use medication by any route, position, wound care, and other measures to relive pain and suffering. May use oxygen, suction and manual treatment of airway obstruction as needed for comfort.      06/12/15 1356    Code Status History    Date Active Date Inactive Code Status Order ID Comments User Context   06/08/2015 12:00 AM 06/12/2015  1:56 PM Full Code [id number]  Roney Jaffe, MD ED      Other Directives:None  Symptom Management:   Dyspnea: Severe. Desturations with minimal activity. Scheduled and PRN morphine. Continue Ventimask. Effusion is worse- ???Pleurx for comfort-->ultimately he will transition to Hospice and full comfort.  Pain: minimal unless moving-->dyspnea predominate  Palliative Prophylaxis:   Bowel Regimen  Additional Recommendations (Limitations, Scope, Preferences):  Currently recieving full scope tx- will likely be transitioning to comfort care soon- started conversation  Psycho-social/Spiritual:  Support System: [place] for further Chaplaincy support:no- "non-religious" Additional Recommendations: Caregiving  Support/Resources and Referral to [employer]   Prognosis: < 4 weeks  Discharge Planning: His goal is to go home-?if care needs can be met would need 24/7 care- I discussed hospice facility with him   Chief Complaint/ Primary Diagnoses: Present on Admission:  . HCAP (healthcare-associated pneumonia) . Hyponatremia . Sepsis due to pneumonia ([place]) . Bone metastasis ([place]) . Brain metastasis ([place]) . Cancer of upper lobe of right lung ([place]) . Leukopenia . Severe protein-calorie malnutrition ([place]) . Anemia of chronic disease . Acute respiratory failure with  hypoxia ([place]) . Edema of upper extremity . SVC syndrome . S/P thoracentesis  I have reviewed the medical record, interviewed the patient and  family, and examined the patient. The following aspects are pertinent.  History reviewed. No pertinent past medical history. Social History   Social History  . Marital Status: Single    Spouse Name: N/A  . Number of Children: 0  . Years of Education: N/A   Social History Main Topics  . Smoking status: Former Smoker -- 1.50 packs/day for 30 years    Types: Cigarettes    Quit date: 01/13/2011  . Smokeless tobacco: Former Systems developer  . Alcohol Use: No  . Drug Use: No  . Sexual Activity: Not Asked   Other Topics Concern  . None   Social History Narrative   Family History  Problem Relation Age of Onset  . Cancer Father     liver/kidney   Scheduled Meds: . antiseptic oral rinse  7 mL Mouth Rinse BID  . benzonatate  100 mg Oral BID  . ceFEPime (MAXIPIME) IV  1 g Intravenous 3 times per day  . chlorpheniramine-HYDROcodone  10 mL Oral Q12H  . feeding supplement (ENSURE ENLIVE)  237 mL Oral BID BM  . furosemide  40 mg Intravenous Daily  . polyethylene glycol  17 g Oral Daily  . senna-docusate  2 tablet Oral BID  . sodium chloride  10-40 mL Intracatheter Q12H   Continuous Infusions: . sodium chloride 10 mL/hr at 06/14/15 0600   PRN Meds:.acetaminophen, albuterol, guaiFENesin-codeine, LORazepam, morphine injection, ondansetron, sodium chloride Medications Prior to Admission:  Prior to Admission medications   Medication Sig Start Date End Date Taking? Authorizing Provider  chlorpheniramine-HYDROcodone (TUSSIONEX) 10-8 MG/5ML SUER Take 5 milliliters by mouth every 12 hours if needed for cough for 05/14/15  Yes Historical Provider, MD  ibuprofen (ADVIL,MOTRIN) 600 MG tablet Take 600 mg by mouth every 6 (six) hours as needed for moderate pain. Reported on 06/02/2015   Yes Historical Provider, MD  polyethylene glycol (MIRALAX / GLYCOLAX) packet Take 17 g by mouth daily as needed for mild constipation.   Yes Historical Provider, MD  prochlorperazine (COMPAZINE) 10 MG tablet Take 1 tablet  (10 mg total) by mouth every 6 (six) hours as needed for nausea or vomiting. 02/25/15  Yes Curt Bears, MD  sennosides-docusate sodium (SENOKOT-S) 8.6-50 MG tablet Take 1 tablet by mouth 2 (two) times daily.   Yes Historical Provider, MD  doxycycline (VIBRA-TABS) 100 MG tablet Take 1 tablet (100 mg total) by mouth 2 (two) times daily. Start when having skin rash. 05/19/15   Curt Bears, MD  lidocaine-prilocaine (EMLA) cream Apply 1 application topically as needed. 02/25/15   Curt Bears, MD  ondansetron (ZOFRAN) 8 MG tablet Take 1 tablet (8 mg total) by mouth every 8 (eight) hours as needed for nausea or vomiting. 05/26/15   Curt Bears, MD   Allergies  Allergen Reactions  . Penicillins Other (See Comments)    Childhood Has patient had a PCN reaction causing immediate rash, facial/tongue/throat swelling, SOB or lightheadedness with hypotension: No. Has patient had a PCN reaction causing severe rash involving mucus membranes or skin necrosis: No Has patient had a PCN reaction that required hospitalization No Has patient had a PCN reaction occurring within the last 10 years: No If all of the above answers are "NO", then may proceed with Cephalospor    Review of Systems  Physical Exam  Constitutional: He appears distressed.  HENT:  Head: Normocephalic and atraumatic.  Cardiovascular:  tachycardic  Musculoskeletal: He exhibits edema.  SVC bilateral arm edema    Vital Signs: BP 112/72 mmHg  Pulse 118  Temp(Src) 97.7 F (36.5 C) (Axillary)  Resp 30  Ht 5' 11"  (1.803 m)  Wt 70 kg (154 lb 5.2 oz)  BMI 21.53 kg/m2  SpO2 95%  SpO2: SpO2: 95 % O2 Device:SpO2: 95 % O2 Flow Rate: .O2 Flow Rate (L/min): 14 L/min  IO: Intake/output summary:  Intake/Output Summary (Last 24 hours) at 06/18/15 1116 Last data filed at 06/18/15 1000  Gross per 24 hour  Intake    630 ml  Output   1825 ml  Net  -1195 ml    LBM: Last BM Date: 06/11/15 Baseline Weight: Weight: 68.04 kg (150  lb) Most recent weight: Weight: 70 kg (154 lb 5.2 oz)      Palliative Assessment/Data:  Flowsheet Rows        Most Recent Value   Intake Tab    Referral Department  Hospitalist   Unit at Time of Referral  ICU   Palliative Care Primary Diagnosis  Cancer   Date Notified  06/16/15   Palliative Care Type  New Palliative care   Reason for referral  Clarify Goals of Care, Pain   Date of Admission  06/07/15   # of days IP prior to Palliative referral  9   Clinical Assessment    Psychosocial & Spiritual Assessment    Palliative Care Outcomes       Additional Data Reviewed:  CBC:    Component Value Date/Time   WBC 28.9* 06/18/2015 0515   WBC 8.3 06/02/2015 0812   WBC 6.9 01/13/2012 1051   HGB 9.2* 06/18/2015 0515   HGB 10.6* 06/02/2015 0812   HGB 14.0* 01/13/2012 1051   HCT 29.0* 06/18/2015 0515   HCT 32.2* 06/02/2015 0812   HCT 45.0 01/13/2012 1051   PLT 620* 06/18/2015 0515   PLT 202 06/02/2015 0812   MCV 89.5 06/18/2015 0515   MCV 83.9 06/02/2015 0812   MCV 98.6* 01/13/2012 1051   NEUTROABS 3.5 06/08/2015 0425   NEUTROABS 7.7* 06/02/2015 0812   LYMPHSABS 0.3* 06/08/2015 0425   LYMPHSABS 0.3* 06/02/2015 0812   MONOABS 0.3 06/08/2015 0425   MONOABS 0.4 06/02/2015 0812   EOSABS 0.0 06/08/2015 0425   EOSABS 0.0 06/02/2015 0812   BASOSABS 0.0 06/08/2015 0425   BASOSABS 0.0 06/02/2015 0812   Comprehensive Metabolic Panel:    Component Value Date/Time   NA 130* 06/18/2015 0515   NA 127* 06/02/2015 0812   K 4.0 06/18/2015 0515   K 4.8 06/02/2015 0812   CL 94* 06/18/2015 0515   CO2 28 06/18/2015 0515   CO2 25 06/02/2015 0812   BUN 8 06/18/2015 0515   BUN 10.9 06/02/2015 0812   CREATININE <0.30* 06/18/2015 0515   CREATININE 0.6* 06/02/2015 0812   CREATININE 0.87 01/13/2012 1052   GLUCOSE 101* 06/18/2015 0515   GLUCOSE 106 06/02/2015 0812   CALCIUM 8.1* 06/18/2015 0515   CALCIUM 9.2 06/02/2015 0812   AST 27 06/18/2015 0515   AST 25 06/02/2015 0812   ALT 14*  06/18/2015 0515   ALT 18 06/02/2015 0812   ALKPHOS 129* 06/18/2015 0515   ALKPHOS 161* 06/02/2015 0812   BILITOT 0.7 06/18/2015 0515   BILITOT 0.89 06/02/2015 0812   PROT 5.5* 06/18/2015 0515   PROT 7.1 06/02/2015 0812   ALBUMIN 1.6* 06/18/2015 0515   ALBUMIN 2.3* 06/02/2015 0812     Time In: 10:30  Time Out: 11:30  Time Total: 60 minutes Greater than 50%  of this time was spent counseling and coordinating care related to the above assessment and plan.  Signed by: Roma Schanz, DO  06/18/2015, 11:16 AM  Please contact Palliative Medicine Team phone at [phone] for questions and concerns.

## 2015-06-19 ENCOUNTER — Inpatient Hospital Stay (HOSPITAL_COMMUNITY): Payer: BLUE CROSS/BLUE SHIELD

## 2015-06-19 ENCOUNTER — Encounter (HOSPITAL_COMMUNITY): Payer: Self-pay | Admitting: Radiology

## 2015-06-19 DIAGNOSIS — J939 Pneumothorax, unspecified: Secondary | ICD-10-CM

## 2015-06-19 DIAGNOSIS — E876 Hypokalemia: Secondary | ICD-10-CM

## 2015-06-19 LAB — CBC
HEMATOCRIT: 28.1 % — AB (ref 39.0–52.0)
Hemoglobin: 9 g/dL — ABNORMAL LOW (ref 13.0–17.0)
MCH: 28.6 pg (ref 26.0–34.0)
MCHC: 32 g/dL (ref 30.0–36.0)
MCV: 89.2 fL (ref 78.0–100.0)
PLATELETS: 610 10*3/uL — AB (ref 150–400)
RBC: 3.15 MIL/uL — ABNORMAL LOW (ref 4.22–5.81)
RDW: 17.5 % — AB (ref 11.5–15.5)
WBC: 22.7 10*3/uL — AB (ref 4.0–10.5)

## 2015-06-19 LAB — BODY FLUID CULTURE: CULTURE: NO GROWTH

## 2015-06-19 NOTE — Progress Notes (Signed)
Patient ID: Bill Valdez, male   DOB: November 11, 1960, 55 y.o.   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 Julien Nordmann who presented to [facility] with reports of subjective fevers, nonproductive cough. He was found to be septic on the admission and was started on broad-spectrum antibiotics for possible pneumonia.  Patient's hospital course was complicated with ongoing hypoxia and need for BiPAP. He was transferred to stepdown 06/12/2015. Currently he is able to keep oxygen saturation above 90% with Ventimask. Appreciate oncology seeing the patient in consultation. Pulmonary is also assisting the management of patient's respiratory status.    Assessment/Plan:    Principal problem:   Acute respiratory failure with hypoxia (HCC) / Sepsis due to pneumonia [facility]) / healthcare associated pneumonia / Leukopenia / Leukocytosis - Sepsis criteria on the admission with presumed source of infection pneumonia, he required transfer to stepdown on 1/7 due to requiring bipap - Patient's chest x-ray most recently 06/12/2015 demonstrated improvement in right pleural effusion after right thoracentesis and improved bilateral lung volumes but heterogeneous opacities throughout the lungs are still persistent and relatively unchanged since prior studies -  He had thoracentesis 06/12/2015 with 1.2 L fluid removed, fluid culture from 1/7grow coag negative staph,  vanc d/ed, cytology no malignant cell seen. - He was initially on vancomycin and aztreonam, then added azithromycin 06/11/2015 since pneumonia was not improving and patient continued to be hypoxic. Azithromycin stopped 06/14/2015. Aztreonam stopped on  1/11, cefepime started on 1/11. vanc stopped on 1/12, - Appreciate pulmonary team following and their recommendations  - We will continue symptomatic management with morphine for work of breathing - Blood cultures and respiratory cultures so far negative Strep pneumoniae is negative as well. - Continue to monitor in SDU as patient's oxygen requirements are still pretty high, back on NRB.    Active problems: Interstitial edema / Pleural effusion, right - Has gotten lasix 40 mg IV 1/6 and 1/7 - Status post thoracentesis 06/12/2015 with 1.2 L fluid removed.  -repeat cxr on 1/11, persistent large right pleural effusion with near confluent insterstitial opacities, stable interstitial densities throughout left lung, repeat thoracentesis on 1/11, -started daily lasix on 1/13, -1/4 Ct chest, possible chest tube drainage, continue abx, appreciate pulm input   Cancer of upper lobe of right lung (HCC) / Brain metastasis (HCC) / Bone metastasis ([place]) / Edema of upper extremity due to SVC syndrome  - Pt presented with right upper lobe suprahilar mass with questionable SVC syndrome in addition to mediastinal lymphadenopathy with extensive bone and liver metastases in September 2016.  - Has received palliative radiotherapy to the large right upper lobe lung mass followed by treatment with BMS 568 (nivolumab/ipilumumab) at [place], discontinued recently secondary to disease progression. - Pt was on systemic chemotherapy with cisplatin and gemcitabine  - Appreciate Dr. Julien Nordmann seeing the patient in consultation. Due to worsening respiratory status, clinically poor prognosis oncology recommend palliative care, possible hospice  -palliative care consulted, meeting today at noon.  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 Hospice? pending palliative team input  IV access:  Peripheral IV  Procedures and diagnostic studies:    Dg Chest 1 View 06/12/2015   No pneumothorax post thoracentesis.  US Thoracentesis Asp Pleural Space W/img Guide 06/12/2015   Successful ultrasound guided right thoracentesis yielding 1200 cc of pleural fluid. After the procedure was performed, just prior to transfer of the patient back to the floor, the patient became tachypneic. His pulse ox saturation droped to 70 percent. [place] became 258 with of systolic blood pressure of 114. The blood pressure was stable. He complained of dyspnea but remain did lucid throughout this episode. Rapid response was called in preparation for endotracheal intubation. The patient and family expressed wishes of do not resuscitate and refused endotracheal intubation. BiPAP support was instituted, then the patient was transferred to the ICU. Electronically Signed   By: Bill Valdez M.D.   On: 06/12/2015 14:58   Dg Chest Portable 1 View 06/07/2015  1. Volume loss in the right hemithorax with right perihilar mass, right basilar opacity and pleural effusion. No interval exams since staging PET-CT, and findings may reflect a combination of post treatment related change, atelectasis or collapse, or pneumonia. 2. Patchy opacity at the left lung base is nonspecific, pneumonia is favored, however new metastatic disease not excluded radiographically.   Dg Chest Port 1 View 06/11/2015  Worsening diffuse interstitial prominence throughout the lungs,  right greater than left. This could represent edema or pneumonia. Focal left lower lobe opacity again noted, stable. Enlarging moderate right pleural effusion.   Medical Consultants:  None  Other Consultants:  Nutrition   IAnti-Infectives:   Aztreonam and vancomycin 06/07/2015 --> Azithromycin 06/11/2015 --> 06/14/2015   Renea Schoonmaker, MD PhD [facility] Pager [phone]  Time spent in minutes: 25 minutes  If 7PM-7AM, please contact night-coverage [email or web address] Password [facility] 06/19/2015, 8:46 AM   LOS: 12 days    HPI/Subjective: Back on NRB, a friend in room  Objective: Filed Vitals:   06/19/15 0450 06/19/15 0700 06/19/15 0800 06/19/15 0810  BP: 111/58   108/63  Pulse: 93  99 101  Temp:  96.7 F (35.9 C)    TempSrc:      Resp: 33  21 18  Height:      Weight:      SpO2: 100%  100% 100%    Intake/Output Summary (Last 24 hours) at 06/19/15 0846 Last data filed at 06/19/15 0800  Gross per 24 hour  Intake   1340 ml  Output   1775 ml  Net   -435 ml    Exam:   General:  Patient frail, mild tachypneic     Cardiovascular: slight sinus tachcardia, appreciate S1, S2   Respiratory: Diminished on the right, no wheezing   Abdomen: (+) BS, non tender   Extremities: UE swelling bilaterally, no leg edema  Neuro: Nonfocal   Data Reviewed: Basic Metabolic Panel:  Recent Labs Lab 06/14/15 1031 06/15/15 0529 06/16/15 0518 06/17/15 0630 06/18/15 0515  NA 131*  --  133* 130* 130*  K 3.5  --  3.9 3.8 4.0  CL 93*  --  94* 94* 94*  CO2 29  --  '29 30 28  '$ GLUCOSE 100*  --  113* 102* 101*  BUN 12  --  '8 7 8  '$ CREATININE 0.34* 0.44* 0.33* 0.31* <0.30*  CALCIUM 7.9*  --  8.0* 8.0* 8.1*  MG  --   --   --   --  1.8   Liver Function Tests:  Recent Labs Lab 06/18/15 0515  AST 27  ALT 14*  ALKPHOS 129*  BILITOT 0.7  PROT 5.5*  ALBUMIN 1.6*   No results for input(s): LIPASE, AMYLASE in the last 168 hours. No results for input(s): AMMONIA in the last 168  hours. CBC:  Recent Labs Lab 06/16/15 0518 06/17/15 0630 06/18/15 0515 06/19/15 0500  WBC 35.7* 28.2* 28.9* 22.7*  HGB 8.9* 9.0* 9.2* 9.0*  HCT 28.6* 28.3* 29.0* 28.1*  MCV 87.5 89.3 89.5 89.2  PLT 552* 559* 620* 610*   Cardiac Enzymes: No results for input(s): CKTOTAL, CKMB, CKMBINDEX, TROPONINI in the last 168 hours. BNP: Invalid input(s): POCBNP CBG: No results for input(s): GLUCAP in the last 168 hours.  Blood Culture (routine x 2)     Status: None   Collection Time: 06/07/15  9:45 PM  Result Value Ref Range Status   Specimen Description BLOOD PORTA CATH  Final   Special Requests BOTTLES DRAWN AEROBIC AND ANAEROBIC 5 ML  Final   Culture   Final    NO GROWTH 5 DAYS Performed at [facility]    Report Status 06/13/2015 FINAL  Final  Blood Culture (routine x 2)     Status: None   Collection Time: 06/07/15  9:46 PM  Result Value Ref Range Status   Specimen Description BLOOD RIGHT ANTECUBITAL  Final   Special Requests BOTTLES DRAWN AEROBIC AND ANAEROBIC 5 ML  Final   Culture   Final    NO GROWTH 5 DAYS Performed at [facility]    Report Status 06/13/2015 FINAL  Final  Urine culture     Status: None   Collection Time: 06/08/15  2:10 AM  Result Value Ref Range Status   Specimen Description URINE, CLEAN CATCH  Final   Special Requests NONE  Final   Culture   Final    NO GROWTH 1 DAY Performed at [facility]    Report Status 06/09/2015 FINAL  Final  Culture, respiratory (NON-Expectorated)     Status: None   Collection Time: 06/08/15 10:20 AM  Result Value Ref Range Status   Specimen Description SPUTUM  Final   Special Requests NONE  Final   Gram Stain   Final   Culture   Final    NORMAL OROPHARYNGEAL FLORA Performed at [facility]    Report Status 06/10/2015 FINAL  Final  Culture, expectorated sputum-assessment     Status: None   Collection Time: 06/08/15 10:20 AM  Result Value Ref Range Status   Specimen Description SPUTUM   Final   Special Requests NONE  Final   Sputum evaluation   Final    THIS SPECIMEN IS ACCEPTABLE. RESPIRATORY CULTURE REPORT TO FOLLOW.   Report Status 06/08/2015 FINAL  Final  Culture, body fluid-bottle     Status: None (Preliminary result)   Collection Time: 06/12/15  3:11 PM  Result Value Ref Range Status   Specimen Description FLUID RIGHT PLEURAL  Final   Special Requests IN PEDIATRIC BOTTLE 5CC  Final   Gram Stain   Final    GRAM POSITIVE COCCI IN CLUSTERS  GRAM POSITIVE RODS CALLED TO EARLY,S RN 06/13/15 2047 [place]    Culture   Final    TOO YOUNG TO READ Performed at [facility]    Report Status PENDING  Incomplete  Gram stain     Status: None   Collection Time: 06/12/15  3:11 PM  Result Value Ref Range Status   Specimen Description FLUID RIGHT PLEURAL  Final   Special Requests NONE  Final   Gram Stain   Final    CYTOSPIN SMEAR WBC PRESENT,BOTH PMN AND MONONUCLEAR NO ORGANISMS SEEN Performed at [facility]    Report Status 06/13/2015 FINAL  Final  MRSA PCR Screening     Status: None   Collection Time: 06/12/15  3:12 PM  Result Value Ref Range Status   MRSA by PCR NEGATIVE NEGATIVE Final     Scheduled Meds: . antiseptic oral rinse  7 mL Mouth Rinse BID  . benzonatate  100 mg Oral BID  . ceFEPime (MAXIPIME) IV  1 g Intravenous 3 times per day  . chlorpheniramine-HYDROcodone  10 mL Oral Q12H  . feeding supplement (ENSURE ENLIVE)  237 mL Oral BID BM  . furosemide  40 mg Intravenous Daily  . metronidazole  500 mg Intravenous Q8H  . morphine  7.5 mg Oral 4 times per day  . polyethylene glycol  17 g Oral Daily  . senna-docusate  2 tablet Oral BID  . sodium chloride  10-40 mL Intracatheter Q12H   Continuous Infusions: . sodium chloride 10 mL/hr at 06/14/15 0600

## 2015-06-19 NOTE — Progress Notes (Signed)
Patient is resting comfortably on NRB at this time. SpO2 is 100% and patient's WOB is not increased. BiPAP not placed on at this time and RT will continue to monitor.

## 2015-06-19 NOTE — Progress Notes (Addendum)
PCCM PROGRESS NOTE  ADMISSION DATE: 06/07/2015 CONSULT DATE: 06/14/2015 REFERRING PROVIDER: [facility]  CC: Short of breath  SUBJECTIVE: Feels weak.  Still has cough.  Pain on right side when he takes deep breath.  VITAL SIGNS: BP 108/63 mmHg  Pulse 101  Temp(Src) 96.7 F (35.9 C) (Oral)  Resp 18  Ht '5\' 11"'$  (1.803 m)  Wt 154 lb 5.2 oz (70 kg)  BMI 21.53 kg/m2  SpO2 100%  INTAKE/OUTPUT: I/O last 3 completed shifts: In: 1570 [P.O.:660; I.V.:360; IV Piggyback:550] Out: 2150 [Urine:2150]  General: pleasant HEENT: no sinus tenderness Cardiac: regular, tachycardic Chest: decreased BS Rt base with bronchial breath sounds Abd: soft, nontender Ext: b/l upper extremity swelling Neuro: alert, follows commands Skin: no rashes   CMP Latest Ref Rng 06/18/2015 06/17/2015 06/16/2015  Glucose 65 - 99 mg/dL 101(H) 102(H) 113(H)  BUN 6 - 20 mg/dL '8 7 8  '$ Creatinine 0.61 - 1.24 mg/dL <0.30(L) 0.31(L) 0.33(L)  Sodium 135 - 145 mmol/L 130(L) 130(L) 133(L)  Potassium 3.5 - 5.1 mmol/L 4.0 3.8 3.9  Chloride 101 - 111 mmol/L 94(L) 94(L) 94(L)  CO2 22 - 32 mmol/L '28 30 29  '$ Calcium 8.9 - 10.3 mg/dL 8.1(L) 8.0(L) 8.0(L)  Total Protein 6.5 - 8.1 g/dL 5.5(L) - -  Total Bilirubin 0.3 - 1.2 mg/dL 0.7 - -  Alkaline Phos 38 - 126 U/L 129(H) - -  AST 15 - 41 U/L 27 - -  ALT 17 - 63 U/L 14(L) - -     CBC Latest Ref Rng 06/19/2015 06/18/2015 06/17/2015  WBC 4.0 - 10.5 K/uL 22.7(H) 28.9(H) 28.2(H)  Hemoglobin 13.0 - 17.0 g/dL 9.0(L) 9.2(L) 9.0(L)  Hematocrit 39.0 - 52.0 % 28.1(L) 29.0(L) 28.3(L)  Platelets 150 - 400 K/uL 610(H) 620(H) 559(H)     Dg Chest Port 1 View  06/18/2015  CLINICAL DATA:  Acute respiratory failure with hypoxemia EXAM: PORTABLE CHEST 1 VIEW COMPARISON:  Two days ago FINDINGS: There is a left-sided porta catheter, tip in stable position. Right pneumothorax is no longer visualized. Right pleural fluid has re- accumulated. Bilateral airspace disease, more extensive on the right. Underlying  COPD with interstitial coarsening. The right lower lobe is obstructed and collapse due to hilar mass. Normal heart size. IMPRESSION: 1. Increasing right pleural effusion. Right pneumothorax is no longer seen. 2. Bilateral pneumonia. Postobstructive lung opacification on the right. Electronically Signed   By: Monte Fantasia M.D.   On: 06/18/2015 09:50     CULTURES: 1/02 Blood >> negative 1/03 Urine >> negative 1/03 Sputum >> oral flora 1/07 Rt pleural fluid >> coag neg Staph 1/11 Rt pleural fluid >>   ANTIBIOTICS: 1/02 Azactam >> 1/10 1/02 Vancomycin >> 1/11 1/06 Zithromax >> 1/09 1/11 Cefepime >> 1/13 Flagyl >>  STUDIES: 1/07 Rt thoracentesis >> 1200 ml fluid, WBC 1156 (72%N, 23%L), cytology negative 1/11 Rt thoracentesis >> 1400 ml fluid, WBC 635 (35%N, 51%L), cytology negative 1/11 Echo >> EF 60 to 65%, PAS 39 mmHg  EVENTS: 1/02 Admit 1/07 Rt thoracentesis by IR 1/09 Oncology consulted 1/11 Palliative care consulted >> DNR  DISCUSSION: 55 yo male presented with progressive dyspnea, fever 102F, from pneumonia complicated by recurrent parapneumonia pleural effusion.  He has hx of Stage 4 NSCLC (Squamous cell) dx in Sept 2016 s/p XRT to [facility], nivolumab/ipilumumab at [facility] (d/c'ed due to disease progression), and most recently on cisplatin/gemcitabine/portrazza with first dose 05/26/15.   ASSESSMENT/PLAN:  Recurrent Rt parapneumonia pleural effusion. Coag neg Staph in pleural fluid cx from 1/07 ?contaminate  versus true infection. Acute hypoxic respiratory failure 2nd to HCAP and pleural effusion. Plan: - Had detailed discussion about options to address pleural fluid >> 1) do nothing, and continue Abx - fluid will likely not resolve with this plan, 2) repeat thoracentesis - already had 2 and fluid re-accumulates quickly, 3) CT chest to define pleural space and consider chest tube drainage, 4) thoracic surgery evaluation for VATs - don't think this is the right option for him  given dx of Stage IV NSCLC - he has opted to get CT chest, define pleural space, and if feasible consider chest tube drainage - Continue cefepime, flagyl for now - adjust oxygen to keep SpO2 > 92%  NSCLC Stage IV. Plan: - f/u with oncology, palliative care  Chesley Mires, MD [place] 06/19/2015, 8:56 AM Pager:  [phone] After 3pm call: [phone]   Ct Chest Wo Contrast  06/19/2015  CLINICAL DATA:  Patient with weakness. Still with cough. Right-sided pleuritic chest pain. Patient has a fever. Diagnosed with pneumonia. Known pleural effusion. History of stage IV squamous cell carcinoma of the lung treated with right upper lobe resection and radiation therapy in September 2016. EXAM: CT CHEST WITHOUT CONTRAST TECHNIQUE: Multidetector CT imaging of the chest was performed following the standard protocol without IV contrast. COMPARISON:  Previous day's chest radiograph.  Chest CT, 04/01/2015. FINDINGS: Neck base and axilla: Prominent to borderline enlarged neck base and axillary shotty lymph nodes. Largest on the right in the right axilla measuring 1 cm short axis. Visualized thyroid is unremarkable. Mediastinum and hila: Confluent soft tissue is seen along the right peritracheal mediastinum inseparable from the superior vena cava. This measures approximately 4.4 x 4.3 cm transversely. There multiple prominent shotty mediastinal lymph nodes. Abnormal soft tissue surrounds the right hilar structures contiguous with collapse/consolidated lung. No definite left hilar mass or adenopathy. Heart is normal in size. Mild prominence of the ascending aorta measuring 3.5 cm in diameter. Lungs and pleura: There is extensive consolidation on the right. The entire right lower lobe is opacified. There is opacification of much of the superiorly and dependent right upper lobe. Milder opacifications noted in the right middle lobe. There is ground-glass type opacification in a patchy distribution in  the left upper lobe. Irregular focal opacities and dependent consolidation is seen in the left lower lobe. Moderate left and small pleural effusions are present. No convincing pulmonary edema. Segmental bronchi to the right lower lobe appear focally occluded. Limited upper abdomen: Well-defined low-density lesion in the lateral segment of the left liver lobe measuring 11 mm, decreased from 2.5 cm previously. Low-density lesion in the posterior segment the right lobe near the dome measures II cm, previously 1.3 cm. Musculoskeletal: Patchy areas of sclerosis are noted, most evident involving the T7 and T12 vertebrae. Mild depression of the upper endplate of F81. No osteolytic lesions. IMPRESSION: 1. Extensive areas of lung consolidation, right greater than left. Findings are consistent with multifocal pneumonia. Some the opacity may be due to postobstructive consolidation. There is a masslike area of opacity along the right peritracheal mediastinum extending to the right hilum consistent with the malignancy noted on the prior chest CT. There multiple shotty lymph nodes in, also noted base and axilla which may be metastatic or reactive. 2. Moderate right small left pleural effusions. Lung consolidation and the pleural effusions are new from the prior CT. 3. Two discrete liver lesions noted in the lesion in the right lobe is larger than on prior CT. The lesion in the  left lobe has decreased in size. 4. Areas of sclerosis thoracic spine is consistent with bony metastatic disease. Electronically Signed   By: Lajean Manes M.D.   On: 06/19/2015 11:03    Discussed with pt.  Will ask IR to assess for pig-tail catheter drainage of Rt pleural space.  Chesley Mires, MD [facility] Pulmonary/Critical Care 06/19/2015, 12:13 PM Pager:  [phone] After 3pm call: [phone]

## 2015-06-20 ENCOUNTER — Inpatient Hospital Stay (HOSPITAL_COMMUNITY): Payer: BLUE CROSS/BLUE SHIELD

## 2015-06-20 DIAGNOSIS — Z515 Encounter for palliative care: Secondary | ICD-10-CM | POA: Insufficient documentation

## 2015-06-20 DIAGNOSIS — J690 Pneumonitis due to inhalation of food and vomit: Secondary | ICD-10-CM

## 2015-06-20 LAB — CBC
HEMATOCRIT: 28.4 % — AB (ref 39.0–52.0)
Hemoglobin: 8.9 g/dL — ABNORMAL LOW (ref 13.0–17.0)
MCH: 28 pg (ref 26.0–34.0)
MCHC: 31.3 g/dL (ref 30.0–36.0)
MCV: 89.3 fL (ref 78.0–100.0)
Platelets: 580 10*3/uL — ABNORMAL HIGH (ref 150–400)
RBC: 3.18 MIL/uL — AB (ref 4.22–5.81)
RDW: 17.6 % — ABNORMAL HIGH (ref 11.5–15.5)
WBC: 21.2 10*3/uL — AB (ref 4.0–10.5)

## 2015-06-20 LAB — BASIC METABOLIC PANEL
ANION GAP: 11 (ref 5–15)
BUN: 7 mg/dL (ref 6–20)
CO2: 29 mmol/L (ref 22–32)
Calcium: 8.1 mg/dL — ABNORMAL LOW (ref 8.9–10.3)
Chloride: 90 mmol/L — ABNORMAL LOW (ref 101–111)
Creatinine, Ser: <0.3 mg/dL — ABNORMAL LOW (ref 0.61–1.24)
GLUCOSE: 99 mg/dL (ref 65–99)
POTASSIUM: 3.7 mmol/L (ref 3.5–5.1)
Sodium: 130 mmol/L — ABNORMAL LOW (ref 135–145)

## 2015-06-20 MED ORDER — CETYLPYRIDINIUM CHLORIDE 0.05 % MT LIQD
7.0000 mL | Freq: Two times a day (BID) | OROMUCOSAL | Status: DC
  Administered 2015-06-21: 7 mL via OROMUCOSAL

## 2015-06-20 MED ORDER — BISACODYL 10 MG RE SUPP
10.0000 mg | Freq: Every day | RECTAL | Status: DC | PRN
  Administered 2015-06-20: 10 mg via RECTAL
  Filled 2015-06-20 (×2): qty 1

## 2015-06-20 MED ORDER — CHLORHEXIDINE GLUCONATE 0.12 % MT SOLN
15.0000 mL | Freq: Two times a day (BID) | OROMUCOSAL | Status: DC
  Administered 2015-06-22: 15 mL via OROMUCOSAL
  Filled 2015-06-20: qty 15

## 2015-06-20 MED ORDER — MAGNESIUM CITRATE PO SOLN
1.0000 | Freq: Once | ORAL | Status: AC
  Administered 2015-06-20: 1 via ORAL
  Filled 2015-06-20: qty 296

## 2015-06-20 MED ORDER — DEXTROMETHORPHAN POLISTIREX ER 30 MG/5ML PO SUER
15.0000 mg | Freq: Every evening | ORAL | Status: DC | PRN
  Filled 2015-06-20: qty 5

## 2015-06-20 NOTE — Progress Notes (Signed)
Daily Progress Note   Patient Name: Bill Valdez       Date: 06/20/2015 DOB: August 22, 1960  Age: 55 y.o. MRN#: [id number] Attending Physician: Florencia Reasons, MD Primary Care Physician: [name], MD Admit Date: 06/07/2015  Reason for Consultation/Follow-up: Non pain symptom management  Subjective:  Patient on NRB mask, resting in bed, alerted by RN that patient and sister wished to have further discussions regarding the patient's overall condition.  Interval Events:  Gets short of breath after speaking few sentences Dyspnea on exertion No nausea, positive for constipation, some mild generalizes pain, some anxiety evident, mild air hunger.   Goals discussion: Patient and sister met with IR. They are asking about his recent CT chest results: discussed the results: PNA, cancer, effusions and consolidations. Discussed about symptom management.  Patient and sister wish to discuss with Dr Earlie Server about the patient's prognosis and about whether or not there are any further treatments that would help prolong his life as well as add quality. If not, they are eager to consider Hospice services.  Both parents are deceased, patient has a girlfriend, no children, has a sister.  Recommended to continue with the Morphine, expand bowel regimen. Have added mag citrate per patient request as well as dulcolax rectal suppository.  Have provided the patient and his sister with active listening and supportive care. Answered all of their questions to the best of my ability.  Dr Hilma Favors will follow up on 06-21-15.   Length of Stay: 13 days  Current Medications: Scheduled Meds:  . antiseptic oral rinse  7 mL Mouth Rinse BID  . benzonatate  100 mg Oral BID  . ceFEPime (MAXIPIME) IV  1 g Intravenous 3 times per  day  . chlorpheniramine-HYDROcodone  10 mL Oral Q12H  . feeding supplement (ENSURE ENLIVE)  237 mL Oral BID BM  . furosemide  40 mg Intravenous Daily  . magnesium citrate  1 Bottle Oral Once  . metronidazole  500 mg Intravenous Q8H  . morphine  7.5 mg Oral 4 times per day  . polyethylene glycol  17 g Oral Daily  . senna-docusate  2 tablet Oral BID  . sodium chloride  10-40 mL Intracatheter Q12H    Continuous Infusions: . sodium chloride 10 mL/hr at 06/14/15 0600    PRN Meds: acetaminophen, albuterol, bisacodyl, dextromethorphan,  LORazepam, morphine injection, ondansetron, sodium chloride  Physical Exam: Physical Exam             Weak frail gentleman awake alert On NRB mask Diminished breath sounds S1 S2 Abdomen soft No edema Awake alert, oriented  Vital Signs: BP 107/66 mmHg  Pulse 120  Temp(Src) 97.7 F (36.5 C) (Oral)  Resp 24  Ht 5' 11"  (1.803 m)  Wt 70 kg (154 lb 5.2 oz)  BMI 21.53 kg/m2  SpO2 97% SpO2: SpO2: 97 % O2 Device: O2 Device: NRB O2 Flow Rate: O2 Flow Rate (L/min): 15 L/min  Intake/output summary:  Intake/Output Summary (Last 24 hours) at 06/20/15 1406 Last data filed at 06/20/15 1326  Gross per 24 hour  Intake    960 ml  Output   2975 ml  Net  -2015 ml   LBM: Last BM Date: 06/18/15 Baseline Weight: Weight: 68.04 kg (150 lb) Most recent weight: Weight: 70 kg (154 lb 5.2 oz)       Palliative Assessment/Data: Flowsheet Rows        Most Recent Value   Intake Tab    Referral Department  Hospitalist   Unit at Time of Referral  ICU   Palliative Care Primary Diagnosis  Cancer   Date Notified  06/16/15   Palliative Care Type  Return patient Palliative Care   Reason for referral  Clarify Goals of Care, Pain   Date of Admission  06/07/15   # of days IP prior to Palliative referral  9   Clinical Assessment    Palliative Performance Scale Score  40%   Pain Max last 24 hours  6   Pain Min Last 24 hours  5   Dyspnea Max Last 24 Hours  8    Dyspnea Min Last 24 hours  7   Nausea Max Last 24 Hours  Not able to report   Nausea Min Last 24 Hours  Not able to report   Anxiety Max Last 24 Hours  Not able to report   Anxiety Min Last 24 Hours  Not able to report   Other Max Last 24 Hours  5   Psychosocial & Spiritual Assessment    Palliative Care Outcomes    Patient/Family meeting held?  Yes   Who was at the meeting?  patient, sister    Patient/Family wishes: Interventions discontinued/not started   Other (Comment)   Palliative Care follow-up planned  Yes, Facility      Additional Data Reviewed: CBC    Component Value Date/Time   WBC 21.2* 06/20/2015 0530   WBC 8.3 06/02/2015 0812   WBC 6.9 01/13/2012 1051   RBC 3.18* 06/20/2015 0530   RBC 3.84* 06/02/2015 0812   RBC 4.56* 01/13/2012 1051   HGB 8.9* 06/20/2015 0530   HGB 10.6* 06/02/2015 0812   HGB 14.0* 01/13/2012 1051   HCT 28.4* 06/20/2015 0530   HCT 32.2* 06/02/2015 0812   HCT 45.0 01/13/2012 1051   PLT 580* 06/20/2015 0530   PLT 202 06/02/2015 0812   MCV 89.3 06/20/2015 0530   MCV 83.9 06/02/2015 0812   MCV 98.6* 01/13/2012 1051   MCH 28.0 06/20/2015 0530   MCH 27.6 06/02/2015 0812   MCH 30.7 01/13/2012 1051   MCHC 31.3 06/20/2015 0530   MCHC 32.9 06/02/2015 0812   MCHC 31.1* 01/13/2012 1051   RDW 17.6* 06/20/2015 0530   RDW 14.5 06/02/2015 0812   LYMPHSABS 0.3* 06/08/2015 0425   LYMPHSABS 0.3* 06/02/2015 0812   MONOABS 0.3 06/08/2015  0425   MONOABS 0.4 06/02/2015 0812   EOSABS 0.0 06/08/2015 0425   EOSABS 0.0 06/02/2015 0812   BASOSABS 0.0 06/08/2015 0425   BASOSABS 0.0 06/02/2015 0812    CMP     Component Value Date/Time   NA 130* 06/20/2015 0530   NA 127* 06/02/2015 0812   K 3.7 06/20/2015 0530   K 4.8 06/02/2015 0812   CL 90* 06/20/2015 0530   CO2 29 06/20/2015 0530   CO2 25 06/02/2015 0812   GLUCOSE 99 06/20/2015 0530   GLUCOSE 106 06/02/2015 0812   BUN 7 06/20/2015 0530   BUN 10.9 06/02/2015 0812   CREATININE <0.30* 06/20/2015 0530    CREATININE 0.6* 06/02/2015 0812   CREATININE 0.87 01/13/2012 1052   CALCIUM 8.1* 06/20/2015 0530   CALCIUM 9.2 06/02/2015 0812   PROT 5.5* 06/18/2015 0515   PROT 7.1 06/02/2015 0812   ALBUMIN 1.6* 06/18/2015 0515   ALBUMIN 2.3* 06/02/2015 0812   AST 27 06/18/2015 0515   AST 25 06/02/2015 0812   ALT 14* 06/18/2015 0515   ALT 18 06/02/2015 0812   ALKPHOS 129* 06/18/2015 0515   ALKPHOS 161* 06/02/2015 0812   BILITOT 0.7 06/18/2015 0515   BILITOT 0.89 06/02/2015 0812   GFRNONAA NOT CALCULATED 06/20/2015 0530   GFRAA NOT CALCULATED 06/20/2015 0530       Problem List:  Patient Active Problem List   Diagnosis Date Noted  . Pneumothorax   . Pressure ulcer 06/17/2015  . S/P thoracentesis   . Leukopenia 06/12/2015  . Leukocytosis 06/12/2015  . Severe protein-calorie malnutrition ([place]) 06/12/2015  . Anemia of chronic disease 06/12/2015  . Pleural effusion, right 06/12/2015  . Acute respiratory failure with hypoxia ([place]) 06/12/2015  . Hypokalemia 06/12/2015  . Edema of upper extremity 06/12/2015  . Interstitial edema 06/12/2015  . 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 Sepsis due to pneumonia ([place]) 06/11/2015  . HCAP (healthcare-associated pneumonia) 06/07/2015  . Hyponatremia 06/07/2015  . Brain metastasis ([place]) 02/26/2015  . Bone metastasis ([place]) 02/26/2015  . Cancer of upper lobe of right lung ([place]) 02/19/2015     Palliative Care Assessment & Plan    1.Code Status:  DNR    Code Status Orders        Start     Ordered   06/12/15 1357  Do not attempt resuscitation (DNR)   Continuous    Question Answer Comment  In the event of cardiac or respiratory ARREST Do not call a "code blue"   In the event of cardiac or respiratory ARREST Do not perform Intubation, CPR, defibrillation or ACLS   In the event of cardiac or respiratory ARREST Use medication by any route, position, wound care, and other measures to relive pain and suffering. May use oxygen, suction and manual treatment  of airway obstruction as needed for comfort.      06/12/15 1356    Code Status History    Date Active Date Inactive Code Status Order ID Comments User Context   06/08/2015 12:00 AM 06/12/2015  1:56 PM Full Code [id number]  Roney Jaffe, MD ED       2. Goals of Care/Additional Recommendations:     Limitations on Scope of Treatment:   Desire for further Chaplaincy support:no  Psycho-social Needs: Caregiving  Support/Resources  3. Symptom Management:      1. Continue Morphine PO and IV PRN and scheduled.  2. Constipation: already on Miralax and Senokot S: will add mag citrate bottle per patient request, also added Dulcolax rectal  suppository.   4. Palliative Prophylaxis:   Bowel Regimen  5. Prognosis: ? < 3 months  6. Discharge Planning:  pending further discussions and clinical course, it is possible that final disposition may include addition of hospice services as an extra layer of support   Care plan was discussed with  Patient, sister and patient's RN    Thank you for allowing the Palliative Medicine Team to assist in the care of this patient.   Time In: 1330 Time Out: 1335 Total Time 35 Prolonged Time Billed  no        [phone] Loistine Chance, MD  06/20/2015, 2:06 PM  Please contact Palliative Medicine Team phone at [phone] for questions and concerns.

## 2015-06-20 NOTE — Progress Notes (Signed)
Patient ID: Bill Valdez, male   DOB: 1961-05-08, 55 y.o.   MRN: [id number]    Chief Complaint: Patient was seen in consultation today for  Chief Complaint  Patient presents with  . Shortness of Breath  Possible chest tube.  Referring Physician(s): Chesley Mires, MD  History of Present Illness: Bill Valdez is a 55 y.o. male who has advanced metastatic lung CA and a presumed infected right pleural effusion. He has been tapped twice last week (1.2 L, 1.4 L). The first culture was positive for staph. The second culture was negative. His respiratory status is stable with pulse ox 100% on a re-breather.  History reviewed. No pertinent past medical history.  Past Surgical History  Procedure Laterality Date  . Hernia repair    . Cyst removal neck      Allergies: Penicillins  Medications: Prior to Admission medications   Medication Sig Start Date End Date Taking? Authorizing Provider  chlorpheniramine-HYDROcodone (TUSSIONEX) 10-8 MG/5ML SUER Take 5 milliliters by mouth every 12 hours if needed for cough for 05/14/15  Yes Historical Provider, MD  ibuprofen (ADVIL,MOTRIN) 600 MG tablet Take 600 mg by mouth every 6 (six) hours as needed for moderate pain. Reported on 06/02/2015   Yes Historical Provider, MD  polyethylene glycol (MIRALAX / GLYCOLAX) packet Take 17 g by mouth daily as needed for mild constipation.   Yes Historical Provider, MD  prochlorperazine (COMPAZINE) 10 MG tablet Take 1 tablet (10 mg total) by mouth every 6 (six) hours as needed for nausea or vomiting. 02/25/15  Yes Curt Bears, MD  sennosides-docusate sodium (SENOKOT-S) 8.6-50 MG tablet Take 1 tablet by mouth 2 (two) times daily.   Yes Historical Provider, MD  doxycycline (VIBRA-TABS) 100 MG tablet Take 1 tablet (100 mg total) by mouth 2 (two) times daily. Start when having skin rash. 05/19/15   Curt Bears, MD  lidocaine-prilocaine (EMLA) cream Apply 1 application topically as needed. 02/25/15   Curt Bears,  MD  ondansetron (ZOFRAN) 8 MG tablet Take 1 tablet (8 mg total) by mouth every 8 (eight) hours as needed for nausea or vomiting. 05/26/15   Curt Bears, MD     Family History  Problem Relation Age of Onset  . Cancer Father     liver/kidney    Social History   Social History  . Marital Status: Single    Spouse Name: N/A  . Number of Children: 0  . Years of Education: N/A   Social History Main Topics  . Smoking status: Former Smoker -- 1.50 packs/day for 30 years    Types: Cigarettes    Quit date: 01/13/2011  . Smokeless tobacco: Former Systems developer  . Alcohol Use: No  . Drug Use: No  . Sexual Activity: Not Asked   Other Topics Concern  . None   Social History Narrative    ECOG Status: 3 - Symptomatic, >50% confined to bed  Review of Systems: A 12 point ROS discussed and pertinent positives are indicated in the HPI above.  All other systems are negative.  Review of Systems  Vital Signs: BP 107/66 mmHg  Pulse 120  Temp(Src) 97.4 F (36.3 C) (Oral)  Resp 24  Ht '5\' 11"'$  (1.803 m)  Wt 154 lb 5.2 oz (70 kg)  BMI 21.53 kg/m2  SpO2 97%  Physical Exam  He is in NAD and is lucid.   Imaging: Dg Chest 1 View  06/12/2015  CLINICAL DATA:  Status post thoracentesis EXAM: CHEST 1 VIEW COMPARISON:  Yesterday FINDINGS: The right  pleural effusion has improved after right thoracentesis. Overall bilateral lung volumes are improved. Heterogeneous opacities throughout both lungs persist and are not significantly changed. Left jugular Port-A-Cath is stable. There is no pneumothorax. IMPRESSION: No pneumothorax post thoracentesis. Electronically Signed   By: Marybelle Killings M.D.   On: 06/12/2015 14:55   Ct Chest Wo Contrast  06/19/2015  CLINICAL DATA:  Patient with weakness. Still with cough. Right-sided pleuritic chest pain. Patient has a fever. Diagnosed with pneumonia. Known pleural effusion. History of stage IV squamous cell carcinoma of the lung treated with right upper lobe resection  and radiation therapy in September 2016. EXAM: CT CHEST WITHOUT CONTRAST TECHNIQUE: Multidetector CT imaging of the chest was performed following the standard protocol without IV contrast. COMPARISON:  Previous day's chest radiograph.  Chest CT, 04/01/2015. FINDINGS: Neck base and axilla: Prominent to borderline enlarged neck base and axillary shotty lymph nodes. Largest on the right in the right axilla measuring 1 cm short axis. Visualized thyroid is unremarkable. Mediastinum and hila: Confluent soft tissue is seen along the right peritracheal mediastinum inseparable from the superior vena cava. This measures approximately 4.4 x 4.3 cm transversely. There multiple prominent shotty mediastinal lymph nodes. Abnormal soft tissue surrounds the right hilar structures contiguous with collapse/consolidated lung. No definite left hilar mass or adenopathy. Heart is normal in size. Mild prominence of the ascending aorta measuring 3.5 cm in diameter. Lungs and pleura: There is extensive consolidation on the right. The entire right lower lobe is opacified. There is opacification of much of the superiorly and dependent right upper lobe. Milder opacifications noted in the right middle lobe. There is ground-glass type opacification in a patchy distribution in the left upper lobe. Irregular focal opacities and dependent consolidation is seen in the left lower lobe. Moderate left and small pleural effusions are present. No convincing pulmonary edema. Segmental bronchi to the right lower lobe appear focally occluded. Limited upper abdomen: Well-defined low-density lesion in the lateral segment of the left liver lobe measuring 11 mm, decreased from 2.5 cm previously. Low-density lesion in the posterior segment the right lobe near the dome measures II cm, previously 1.3 cm. Musculoskeletal: Patchy areas of sclerosis are noted, most evident involving the T7 and T12 vertebrae. Mild depression of the upper endplate of Z56. No osteolytic  lesions. IMPRESSION: 1. Extensive areas of lung consolidation, right greater than left. Findings are consistent with multifocal pneumonia. Some the opacity may be due to postobstructive consolidation. There is a masslike area of opacity along the right peritracheal mediastinum extending to the right hilum consistent with the malignancy noted on the prior chest CT. There multiple shotty lymph nodes in, also noted base and axilla which may be metastatic or reactive. 2. Moderate right small left pleural effusions. Lung consolidation and the pleural effusions are new from the prior CT. 3. Two discrete liver lesions noted in the lesion in the right lobe is larger than on prior CT. The lesion in the left lobe has decreased in size. 4. Areas of sclerosis thoracic spine is consistent with bony metastatic disease. Electronically Signed   By: Lajean Manes M.D.   On: 06/19/2015 11:03   Dg Chest Port 1 View  06/18/2015  CLINICAL DATA:  Acute respiratory failure with hypoxemia EXAM: PORTABLE CHEST 1 VIEW COMPARISON:  Two days ago FINDINGS: There is a left-sided porta catheter, tip in stable position. Right pneumothorax is no longer visualized. Right pleural fluid has re- accumulated. Bilateral airspace disease, more extensive on the right. Underlying COPD  with interstitial coarsening. The right lower lobe is obstructed and collapse due to hilar mass. Normal heart size. IMPRESSION: 1. Increasing right pleural effusion. Right pneumothorax is no longer seen. 2. Bilateral pneumonia. Postobstructive lung opacification on the right. Electronically Signed   By: Monte Fantasia M.D.   On: 06/18/2015 09:50   Dg Chest Port 1 View  06/16/2015  CLINICAL DATA:  Followup right pneumothorax. Metastatic right lung carcinoma. Healthcare associated pneumonia and sepsis. EXAM: PORTABLE CHEST 1 VIEW COMPARISON:  04/15/2016 FINDINGS: Tiny less than 10% right lateral pneumothorax again demonstrated as well as small right pleural effusion.  Asymmetric airspace disease is again seen involving right lung greater than left, without significant change. Heart size is stable. Left-sided power port remains in appropriate position. IMPRESSION: Stable small right hydropneumothorax. No significant change in asymmetric airspace disease. Electronically Signed   By: Earle Gell M.D.   On: 06/16/2015 16:02   Dg Chest Port 1 View  06/16/2015  CLINICAL DATA:  Status post thoracentesis EXAM: PORTABLE CHEST - 1 VIEW COMPARISON:  06/16/2015 FINDINGS: Cardiac shadow is stable. A left chest wall port is again seen. There is been interval right-sided thoracentesis. Minimal right lateral pneumothorax is noted. Diffuse infiltrates are noted throughout the right lung as well as some diffuse interstitial changes in the left lung. This is stable from the prior study. IMPRESSION: Minimal lateral pneumothorax following thoracentesis. There has been significant reduction in pleural fluid when compared with the prior exam. These results were called by telephone at the time of interpretation on 06/16/2015 at 12:27 pm to [facility] of the patients nurse, who verbally acknowledged these results and will notify the performing physician. Electronically Signed   By: Inez Catalina M.D.   On: 06/16/2015 12:29   Dg Chest Port 1 View  06/16/2015  CLINICAL DATA:  Respiratory failure, right upper lung malignancy with distant metastases, healthcare associated pneumonia, sepsis, right pleural effusion EXAM: PORTABLE CHEST 1 VIEW COMPARISON:  Portable chest x-ray of June 14, 2015 FINDINGS: There remains volume loss on the right with large pleural effusion. Interstitial opacities with areas of confluence persist in the right mid and upper lung. On the left there is stable interstitial opacities. There is no left pleural effusion. The heart is normal in size. The pulmonary vascularity is not engorged. The Port-A-Cath appliance tip projects over the distal third of the SVC. IMPRESSION: Allowing for  differences in positioning there has not been significant interval change in the appearance of the chest since the study of 2 days ago. Persistent large right pleural effusion with near confluent interstitial opacities. Stable interstitial densities throughout much of the left lung. Electronically Signed   By: David  Martinique M.D.   On: 06/16/2015 07:12   Dg Chest Port 1 View  06/14/2015  CLINICAL DATA:  Acute respiratory failure. EXAM: PORTABLE CHEST 1 VIEW COMPARISON:  06/12/2015 and CT chest 02/18/2015. FINDINGS: Trachea is midline. Left IJ power port tip projects over the low SVC or SVC RA junction, stable. Right hilar soft tissue prominence, stable. Mixed interstitial and airspace opacification, right greater than left, similar to 06/12/2015. Biapical pleural thickening. Moderate right pleural effusion. Healing lower left lateral rib fracture. IMPRESSION: 1. Low right paratracheal adenopathy. 2. Mixed interstitial and airspace opacification, right greater than left, possibly due to edema or pneumonia, stable 3. Moderate right pleural effusion. Electronically Signed   By: Lorin Picket M.D.   On: 06/14/2015 15:10   Dg Chest Port 1 View  06/11/2015  CLINICAL DATA:  Shortness  of breath, cough today. Follow-up pneumonia. History of lung cancer. EXAM: PORTABLE CHEST 1 VIEW COMPARISON:  06/07/2015 FINDINGS: Left Port-A-Cath remains in place, unchanged. Right suprahilar mass again noted. Increasing interstitial opacities throughout both lungs, right greater than left. Focal opacity in the left lung base again noted. Moderate right pleural effusion, also enlarging. Heart size appears normal. IMPRESSION: Worsening diffuse interstitial prominence throughout the lungs, right greater than left. This could represent edema or pneumonia. Focal left lower lobe opacity again noted, stable. Enlarging moderate right pleural effusion. Electronically Signed   By: Rolm Baptise M.D.   On: 06/11/2015 10:29   Dg Chest Portable 1  View  06/07/2015  CLINICAL DATA:  Worsening shortness of breath today and fever. Metastatic lung cancer with metastasis to liver, brain and bone. EXAM: PORTABLE CHEST 1 VIEW COMPARISON:  Most recent imaging PET-CT 02/23/2015 FINDINGS: Tip of the left chest port in the SVC. Right suprahilar mass again seen, with surrounding irregular opacity. Volume loss in the right hemithorax with elevation of right hemidiaphragm and right pleural effusion. Diffuse multifocal opacities throughout the lower right hemithorax. There is an ill-defined patchy opacity at the left lung base. Mediastinal contours are grossly unchanged allowing for differences in technique. Callus formation noted about left lateral rib fracture. IMPRESSION: 1. Volume loss in the right hemithorax with right perihilar mass, right basilar opacity and pleural effusion. No interval exams since staging PET-CT, and findings may reflect a combination of post treatment related change, atelectasis or collapse, or pneumonia. 2. Patchy opacity at the left lung base is nonspecific, pneumonia is favored, however new metastatic disease not excluded radiographically. Electronically Signed   By: Jeb Levering M.D.   On: 06/07/2015 21:29   US Thoracentesis Asp Pleural Space W/img Guide  06/12/2015  CLINICAL DATA:  Right pleural effusion EXAM: ULTRASOUND GUIDED RIGHT THORACENTESIS COMPARISON:  None. PROCEDURE: An ultrasound guided thoracentesis was thoroughly discussed with the patient and questions answered. The benefits, risks, alternatives and complications were also discussed. The patient understands and wishes to proceed with the procedure. Written consent was obtained. Ultrasound was performed to localize and mark an adequate pocket of fluid in the right chest. The area was then prepped and draped in the normal sterile fashion. 1% Lidocaine was used for local anesthesia. Under ultrasound guidance a Safe-T-Centesis catheter was introduced. Thoracentesis was  performed. The catheter was removed and a dressing applied. COMPLICATIONS: None. FINDINGS: A total of approximately 1200 cc of clear yellow fluid was removed. A fluid sample was sent for laboratory analysis. IMPRESSION: Successful ultrasound guided right thoracentesis yielding 1200 cc of pleural fluid. After the procedure was performed, just prior to transfer of the patient back to the floor, the patient became tachypneic. His pulse ox saturation droped to 70 percent. Poles became 353 with of systolic blood pressure of 114. The blood pressure was stable. He complained of dyspnea but remain did lucid throughout this episode. Rapid response was called in preparation for endotracheal intubation. The patient and family expressed wishes of do not resuscitate and refused endotracheal intubation. BiPAP support was instituted, then the patient was transferred to the ICU. Electronically Signed   By: Marybelle Killings M.D.   On: 06/12/2015 14:58    Labs:  CBC:  Recent Labs  06/17/15 0630 06/18/15 0515 06/19/15 0500 06/20/15 0530  WBC 28.2* 28.9* 22.7* 21.2*  HGB 9.0* 9.2* 9.0* 8.9*  HCT 28.3* 29.0* 28.1* 28.4*  PLT 559* 620* 610* 580*    COAGS:  Recent Labs  02/22/15 1330  03/03/15 1257  INR 1.22 1.19  APTT 33  --     BMP:  Recent Labs  06/16/15 0518 06/17/15 0630 06/18/15 0515 06/20/15 0530  NA 133* 130* 130* 130*  K 3.9 3.8 4.0 3.7  CL 94* 94* 94* 90*  CO2 '29 30 28 29  '$ GLUCOSE 113* 102* 101* 99  BUN '8 7 8 7  '$ CALCIUM 8.0* 8.0* 8.1* 8.1*  CREATININE 0.33* 0.31* <0.30* <0.30*  GFRNONAA >60 >60 NOT CALCULATED NOT CALCULATED  GFRAA >60 >60 NOT CALCULATED NOT CALCULATED    LIVER FUNCTION TESTS:  Recent Labs  05/26/15 0854 06/02/15 0812 06/07/15 2030 06/18/15 0515  BILITOT 0.79 0.89 1.2 0.7  AST 30 25 45* 27  ALT '22 18 28 '$ 14*  ALKPHOS 141 161* 146* 129*  PROT 7.2 7.1 5.8* 5.5*  ALBUMIN 2.4* 2.3* 2.0* 1.6*    TUMOR MARKERS: No results for input(s): AFPTM, CEA, CA199,  CHROMGRNA in the last 8760 hours.  Assessment and Plan:  Bill Valdez now has leukocytosis and a possible early right empyema. Pneumonia continues in the right lung. I had  a long discussion with him and his family regarding placing a chest tube. I did give the option of a thoracentesis today as well. PleurX is not indicated with an active infection. He wishes to consult with Oncology and Palliative care before proceeding.  Thank you for this interesting consult.  I greatly enjoyed meeting Bill Valdez and look forward to participating in their care.  A copy of this report was sent to the requesting provider on this date.  Electronically Signed: Raidon Swanner, ART A 06/20/2015, 11:25 AM   I spent a total of  40 Minutes in face to face in clinical consultation, greater than 50% of which was counseling/coordinating care for chest tube placement.

## 2015-06-20 NOTE — Progress Notes (Addendum)
Patient ID: Bill Valdez, male   DOB: 07/30/1960, 55 y.o.   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 Julien Nordmann who presented to [facility] with reports of subjective fevers, nonproductive cough. He was found to be septic on the admission and was started on broad-spectrum antibiotics for possible pneumonia.  Patient's hospital course was complicated with ongoing hypoxia and need for BiPAP. He was transferred to stepdown 06/12/2015. Currently he is able to keep oxygen saturation above 90% with Ventimask. Appreciate oncology seeing the patient in consultation. Pulmonary is also assisting the management of patient's respiratory status.    Assessment/Plan:    Principal problem:   Acute respiratory failure with hypoxia ([place]) / Sepsis due to pneumonia [facility]) / healthcare associated pneumonia / Leukopenia / Leukocytosis - Sepsis presented on the admission with  Pneumonia, sinus tachycardia, hypoxia, lactic acidosis,  he required transfer to stepdown on 1/7 due to requiring bipap - Patient's chest x-ray most recently 06/12/2015 demonstrated improvement in right pleural effusion after right thoracentesis and improved bilateral lung volumes but heterogeneous opacities throughout the lungs are still persistent and relatively unchanged since prior studies -  He had thoracentesis 06/12/2015 with 1.2 L fluid removed, fluid culture from 1/7grow coag negative staph,  vanc d/ed, cytology no malignant cell seen. - He was initially on vancomycin and aztreonam, then added azithromycin 06/11/2015 since pneumonia was not improving and patient continued to be hypoxic. Azithromycin stopped 06/14/2015.  Aztreonam stopped on 1/11, cefepime started on 1/11. vanc stopped on 1/12, - Appreciate pulmonary team following and their recommendations  - We will continue symptomatic management with morphine for work of breathing - Blood cultures and respiratory cultures so far negative Strep pneumoniae is negative as well. - Continue to monitor in SDU as patient's oxygen requirements are still pretty high, back on NRB.    Active problems: Interstitial edema / Pleural effusion, right - Has gotten lasix 40 mg IV 1/6 and 1/7 - Status post thoracentesis 06/12/2015 with 1.2 L fluid removed.  -repeat cxr on 1/11, persistent large right pleural effusion with near confluent insterstitial opacities, stable interstitial densities throughout left lung, repeat thoracentesis on 1/11, -started daily lasix on 1/13, -1/4 Ct chest, possible chest tube drainage, continue abx, appreciate pulm input   Cancer of upper lobe of right lung (HCC) / Brain metastasis (HCC) / Bone metastasis ([place]) / Edema of upper extremity due to SVC syndrome  - Pt presented with right upper lobe suprahilar mass with questionable SVC syndrome in addition to mediastinal lymphadenopathy with extensive bone and liver metastases in September 2016.  - Has received palliative radiotherapy to the large right upper lobe lung mass followed by treatment with BMS 568 (nivolumab/ipilumumab) at [place], discontinued recently secondary to disease progression. - Pt was on systemic chemotherapy with cisplatin and gemcitabine  - Appreciate Dr. Julien Nordmann seeing the patient in consultation. Due to worsening respiratory status, clinically poor prognosis oncology recommend palliative care, possible hospice  -palliative care consulted, meeting today at noon.  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 Hospice? pending palliative team input  IV access:  Peripheral IV  Procedures and diagnostic studies:    Dg Chest 1 View 06/12/2015   No pneumothorax post thoracentesis.  US Thoracentesis Asp Pleural Space W/img Guide 06/12/2015   Successful ultrasound guided right thoracentesis yielding 1200 cc of pleural fluid. After the procedure was performed, just prior to transfer of the patient back to the floor, the patient became tachypneic. His pulse ox saturation droped to 70 percent. [place] became 654 with of systolic blood pressure of 114. The blood pressure was stable. He complained of dyspnea but remain did lucid throughout this episode. Rapid response was called in preparation for endotracheal intubation. The patient and family expressed wishes of do not resuscitate and refused endotracheal intubation. BiPAP support was instituted, then the patient was transferred to the ICU. Electronically Signed   By: Marybelle Killings M.D.   On: 06/12/2015 14:58   Dg Chest Portable 1 View 06/07/2015  1. Volume loss in the right hemithorax with right perihilar mass, right basilar opacity and pleural effusion. No interval exams since staging PET-CT, and findings may reflect a combination of post treatment related change, atelectasis or collapse, or pneumonia. 2. Patchy opacity at the left lung base is nonspecific, pneumonia is favored, however new metastatic disease not excluded radiographically.   Dg Chest Port 1 View 06/11/2015  Worsening diffuse interstitial prominence  throughout the lungs, right greater than left. This could represent edema or pneumonia. Focal left lower lobe opacity again noted, stable. Enlarging moderate right pleural effusion.   Medical Consultants:  None  Other Consultants:  Nutrition   IAnti-Infectives:   Aztreonam and vancomycin 06/07/2015 --> Azithromycin 06/11/2015 --> 06/14/2015   Kamal Jurgens, MD PhD [facility] Pager [phone]  Time spent in minutes: 25 minutes  If 7PM-7AM, please contact night-coverage [email or web address] Password [email or web address] 06/20/2015, 10:25 AM   LOS: 13 days    HPI/Subjective: Back on NRB, reported cough at night, not able to sleep well, declined sleep aids, he has not made decision about chest tube, palliative team and critical care continue to follow the patient.  Objective: Filed Vitals:   06/20/15 0428 06/20/15 0800 06/20/15 0841 06/20/15 0842  BP: 128/73   107/66  Pulse:      Temp:  97.4 F (36.3 C)    TempSrc:  Oral    Resp:  '18 20 24  '$ Height:      Weight:      SpO2:  100% 99% 97%    Intake/Output Summary (Last 24 hours) at 06/20/15 1025 Last data filed at 06/20/15 0700  Gross per 24 hour  Intake    990 ml  Output   2575 ml  Net  -1585 ml    Exam:   General:  Patient frail, mild tachypneic     Cardiovascular: slight sinus tachcardia, appreciate S1, S2   Respiratory: Diminished on the right, no wheezing   Abdomen: (+) BS, non tender   Extremities: UE swelling bilaterally, no leg edema  Neuro: Nonfocal   Data Reviewed: Basic Metabolic Panel:  Recent Labs Lab 06/14/15 1031 06/15/15 0529 06/16/15  0518 06/17/15 0630 06/18/15 0515 06/20/15 0530  NA 131*  --  133* 130* 130* 130*  K 3.5  --  3.9 3.8 4.0 3.7  CL 93*  --  94* 94* 94* 90*  CO2 29  --  '29 30 28 29  '$ GLUCOSE 100*  --  113* 102* 101* 99  BUN 12  --  '8 7 8 7  '$ CREATININE 0.34* 0.44* 0.33* 0.31* <0.30* <0.30*  CALCIUM 7.9*  --  8.0* 8.0* 8.1* 8.1*  MG  --   --   --   --  1.8  --    Liver Function  Tests:  Recent Labs Lab 06/18/15 0515  AST 27  ALT 14*  ALKPHOS 129*  BILITOT 0.7  PROT 5.5*  ALBUMIN 1.6*   No results for input(s): LIPASE, AMYLASE in the last 168 hours. No results for input(s): AMMONIA in the last 168 hours. CBC:  Recent Labs Lab 06/16/15 0518 06/17/15 0630 06/18/15 0515 06/19/15 0500 06/20/15 0530  WBC 35.7* 28.2* 28.9* 22.7* 21.2*  HGB 8.9* 9.0* 9.2* 9.0* 8.9*  HCT 28.6* 28.3* 29.0* 28.1* 28.4*  MCV 87.5 89.3 89.5 89.2 89.3  PLT 552* 559* 620* 610* 580*   Cardiac Enzymes: No results for input(s): CKTOTAL, CKMB, CKMBINDEX, TROPONINI in the last 168 hours. BNP: Invalid input(s): POCBNP CBG: No results for input(s): GLUCAP in the last 168 hours.  Blood Culture (routine x 2)     Status: None   Collection Time: 06/07/15  9:45 PM  Result Value Ref Range Status   Specimen Description BLOOD PORTA CATH  Final   Special Requests BOTTLES DRAWN AEROBIC AND ANAEROBIC 5 ML  Final   Culture   Final    NO GROWTH 5 DAYS Performed at [facility]    Report Status 06/13/2015 FINAL  Final  Blood Culture (routine x 2)     Status: None   Collection Time: 06/07/15  9:46 PM  Result Value Ref Range Status   Specimen Description BLOOD RIGHT ANTECUBITAL  Final   Special Requests BOTTLES DRAWN AEROBIC AND ANAEROBIC 5 ML  Final   Culture   Final    NO GROWTH 5 DAYS Performed at [facility]    Report Status 06/13/2015 FINAL  Final  Urine culture     Status: None   Collection Time: 06/08/15  2:10 AM  Result Value Ref Range Status   Specimen Description URINE, CLEAN CATCH  Final   Special Requests NONE  Final   Culture   Final    NO GROWTH 1 DAY Performed at [facility]    Report Status 06/09/2015 FINAL  Final  Culture, respiratory (NON-Expectorated)     Status: None   Collection Time: 06/08/15 10:20 AM  Result Value Ref Range Status   Specimen Description SPUTUM  Final   Special Requests NONE  Final   Gram Stain   Final   Culture    Final    NORMAL OROPHARYNGEAL FLORA Performed at [facility]    Report Status 06/10/2015 FINAL  Final  Culture, expectorated sputum-assessment     Status: None   Collection Time: 06/08/15 10:20 AM  Result Value Ref Range Status   Specimen Description SPUTUM  Final   Special Requests NONE  Final   Sputum evaluation   Final    THIS SPECIMEN IS ACCEPTABLE. RESPIRATORY CULTURE REPORT TO FOLLOW.   Report Status 06/08/2015 FINAL  Final  Culture, body fluid-bottle     Status: None (Preliminary  result)   Collection Time: 06/12/15  3:11 PM  Result Value Ref Range Status   Specimen Description FLUID RIGHT PLEURAL  Final   Special Requests IN PEDIATRIC BOTTLE 5CC  Final   Gram Stain   Final    GRAM POSITIVE COCCI IN CLUSTERS GRAM POSITIVE RODS CALLED TO EARLY,S RN 06/13/15 2047 [place]    Culture   Final    TOO YOUNG TO READ Performed at [facility]    Report Status PENDING  Incomplete  Gram stain     Status: None   Collection Time: 06/12/15  3:11 PM  Result Value Ref Range Status   Specimen Description FLUID RIGHT PLEURAL  Final   Special Requests NONE  Final   Gram Stain   Final    CYTOSPIN SMEAR WBC PRESENT,BOTH PMN AND MONONUCLEAR NO ORGANISMS SEEN Performed at [facility]    Report Status 06/13/2015 FINAL  Final  MRSA PCR Screening     Status: None   Collection Time: 06/12/15  3:12 PM  Result Value Ref Range Status   MRSA by PCR NEGATIVE NEGATIVE Final     Scheduled Meds: . antiseptic oral rinse  7 mL Mouth Rinse BID  . benzonatate  100 mg Oral BID  . ceFEPime (MAXIPIME) IV  1 g Intravenous 3 times per day  . chlorpheniramine-HYDROcodone  10 mL Oral Q12H  . feeding supplement (ENSURE ENLIVE)  237 mL Oral BID BM  . furosemide  40 mg Intravenous Daily  . metronidazole  500 mg Intravenous Q8H  . morphine  7.5 mg Oral 4 times per day  . polyethylene glycol  17 g Oral Daily  . senna-docusate  2 tablet Oral BID  . sodium chloride  10-40 mL  Intracatheter Q12H   Continuous Infusions: . sodium chloride 10 mL/hr at 06/14/15 0600

## 2015-06-21 ENCOUNTER — Ambulatory Visit
Admission: RE | Admit: 2015-06-21 | Payer: BLUE CROSS/BLUE SHIELD | Source: Ambulatory Visit | Admitting: Radiation Oncology

## 2015-06-21 ENCOUNTER — Inpatient Hospital Stay
Admission: RE | Admit: 2015-06-21 | Discharge: 2015-06-21 | Disposition: A | Discharge status: Home / Self Care | Payer: BLUE CROSS/BLUE SHIELD | Source: Ambulatory Visit | Attending: Radiation Oncology | Admitting: Radiation Oncology

## 2015-06-21 DIAGNOSIS — R079 Chest pain, unspecified: Secondary | ICD-10-CM

## 2015-06-21 DIAGNOSIS — Z515 Encounter for palliative care: Secondary | ICD-10-CM

## 2015-06-21 DIAGNOSIS — R0609 Other forms of dyspnea: Secondary | ICD-10-CM

## 2015-06-21 LAB — BASIC METABOLIC PANEL
Anion gap: 10 (ref 5–15)
BUN: 8 mg/dL (ref 6–20)
CHLORIDE: 92 mmol/L — AB (ref 101–111)
CO2: 30 mmol/L (ref 22–32)
Calcium: 8.1 mg/dL — ABNORMAL LOW (ref 8.9–10.3)
Creatinine, Ser: <0.3 mg/dL — ABNORMAL LOW (ref 0.61–1.24)
GLUCOSE: 109 mg/dL — AB (ref 65–99)
POTASSIUM: 3.9 mmol/L (ref 3.5–5.1)
Sodium: 132 mmol/L — ABNORMAL LOW (ref 135–145)

## 2015-06-21 MED ORDER — MORPHINE SULFATE 2 MG/ML IV SOLN
INTRAVENOUS | Status: DC
  Administered 2015-06-21: 1.83 mg via INTRAVENOUS
  Administered 2015-06-21: 15.76 mg via INTRAVENOUS
  Administered 2015-06-21: 16:00:00 via INTRAVENOUS
  Administered 2015-06-22: 5.28 mg via INTRAVENOUS
  Administered 2015-06-22: 10:00:00 via INTRAVENOUS
  Administered 2015-06-22: 3.46 mg via INTRAVENOUS
  Administered 2015-06-22: 3.99 mg via INTRAVENOUS
  Filled 2015-06-21 (×2): qty 25

## 2015-06-21 MED ORDER — DIPHENHYDRAMINE HCL 50 MG/ML IJ SOLN: 12.5000 mg | Freq: Four times a day (QID) | INTRAMUSCULAR | Status: DC | PRN

## 2015-06-21 MED ORDER — METHYLNALTREXONE BROMIDE 12 MG/0.6ML ~~LOC~~ SOLN
12.0000 mg | SUBCUTANEOUS | Status: DC | PRN
Start: 2015-06-21 — End: 2015-06-22
  Filled 2015-06-21: qty 0.6

## 2015-06-21 MED ORDER — DIPHENHYDRAMINE HCL 12.5 MG/5ML PO ELIX: 12.5000 mg | ORAL_SOLUTION | Freq: Four times a day (QID) | ORAL | Status: DC | PRN

## 2015-06-21 MED ORDER — HYDROCOD POLST-CPM POLST ER 10-8 MG/5ML PO SUER
10.0000 mL | Freq: Two times a day (BID) | ORAL | Status: DC | PRN
  Administered 2015-06-21: 10 mL via ORAL
  Filled 2015-06-21: qty 10

## 2015-06-21 MED ORDER — ONDANSETRON HCL 4 MG/2ML IJ SOLN: 4.0000 mg | Freq: Four times a day (QID) | INTRAMUSCULAR | Status: DC | PRN

## 2015-06-21 NOTE — Progress Notes (Signed)
PCCM PROGRESS NOTE  Met with pt, his girlfriend and his sister.  Had detailed discussion about goal of care.  They are in agreement to transition to hospice care.  They do not want to pursue any additional intervention for pleural effusion.  They are hopeful that he can go home with hospice.  Will defer further management to [facility] and palliative care team.  Please call if additional help is needed.  Chesley Mires, MD [facility] Pulmonary/Critical Care 06/21/2015, 9:02 AM Pager:  [phone] After 3pm call: [phone]

## 2015-06-21 NOTE — Progress Notes (Signed)
Pharmacy Antibiotic Follow-up Note  Marshall Kampf is a 55 y.o. year-old male admitted on 06/07/2015.  The patient has been on antibiotics since 06/07/15 but is currently on day #6 Cefepime (changed from aztreonam 1/11) and day #3 Metronidazole for empyema/pneumonia.  S/p thoracentesis 1/7 and 1/11 - first fluid collection grew coagulase negative Staph on culture, second collection no growth.  Pleural fluid re-accumulates quickly.   Assessment/Plan: No adjustments to antibiotics.  Temp (24hrs), Avg:97.7 F (36.5 C), Min:97.6 F (36.4 C), Max:98 F (36.7 C)   Recent Labs Lab 06/16/15 0518 06/17/15 0630 06/18/15 0515 06/19/15 0500 06/20/15 0530  WBC 35.7* 28.2* 28.9* 22.7* 21.2*     Recent Labs Lab 06/16/15 0518 06/17/15 0630 06/18/15 0515 06/20/15 0530 06/21/15 0515  CREATININE 0.33* 0.31* <0.30* <0.30* <0.30*   CrCl cannot be calculated (Patient has no serum creatinine result on file.).    Allergies  Allergen Reactions  . Penicillins Other (See Comments)    Childhood Has patient had a PCN reaction causing immediate rash, facial/tongue/throat swelling, SOB or lightheadedness with hypotension: No. Has patient had a PCN reaction causing severe rash involving mucus membranes or skin necrosis: No Has patient had a PCN reaction that required hospitalization No Has patient had a PCN reaction occurring within the last 10 years: No If all of the above answers are "NO", then may proceed with Cephalospor    Antimicrobials this admission: 1/2 >> Vanc >> 1/12 1/3 >> Azactam >> 1/11 1/6 >> Azithromycin >> 1/10 1/11 >> Cefepime >> 1/13 >> Flagyl >>  Levels/dose changes this admission: 1/5 0600: VT 8 on '750mg'$  iv q8hr--prior doses charted correctly.  1/6 1930: VT 11 on 1gm IV q8h (missed 12n dose on 1/5) 1/10 0530: VT 26 on '1250mg'$  IV q8h  Microbiology results: 1/2 bloodx2: NGF 1/2 urine: NGF 1/3 sputum: normal flora-final 1/3 Strep pneumo antigen negative 1/7 R pleural fluid:  CoNS (pan-sensitive), non-fermentative gram neg rods (not identified) 1/11 pleural fluid: NGF MRSA PCR neg  Thank you for allowing pharmacy to be a part of this patient's care.  Hershal Coria, PharmD, BCPS Pager: [phone] 06/21/2015 4:10 PM

## 2015-06-21 NOTE — Progress Notes (Signed)
Date: June 21, 2015 Chart reviewed for concurrent status and case management needs. Will continue to follow patient for changes and needs:  Remains on non rebreather mask with frequent 02 adjustments Velva Harman, RN, BSN, [place]   [phone]

## 2015-06-21 NOTE — Progress Notes (Signed)
Subjective: Bill Valdez is seen and examined today. His sister and get her friend were at the bedside. He is feeling fine this morning except for the shortness breath and intermittent right-sided chest pain. He still has swelling of the upper extremities more on the left side. The patient decided to take his oxygen off. He is doing more towards palliative care. He denied having any fever or chills. He has no nausea or vomiting.  Objective: Vital signs in last 24 hours: Temp:  [97.6 F (36.4 C)-98 F (36.7 C)] 98 F (36.7 C) (01/16 0425) Resp:  [19-30] 24 (01/16 0416) BP: (106-133)/(65-78) 106/65 mmHg (01/16 0416) SpO2:  [90 %-100 %] 97 % (01/16 0416) FiO2 (%):  [55 %] 55 % (01/15 1201)  Intake/Output from previous day: 01/15 0701 - 01/16 0700 In: 980 [P.O.:300; I.V.:230; IV Piggyback:450] Out: 2825 [Urine:2825] Intake/Output this shift:    General appearance: alert, cooperative, fatigued and no distress Resp: diminished breath sounds RLL and wheezes bilaterally Cardio: regular rate and rhythm, S1, S2 normal, no murmur, click, rub or gallop GI: soft, non-tender; bowel sounds normal; no masses,  no organomegaly Extremities: extremities normal, atraumatic, no cyanosis or edema  Lab Results:   Recent Labs  06/19/15 0500 06/20/15 0530  WBC 22.7* 21.2*  HGB 9.0* 8.9*  HCT 28.1* 28.4*  PLT 610* 580*   BMET  Recent Labs  06/20/15 0530 06/21/15 0515  NA 130* 132*  K 3.7 3.9  CL 90* 92*  CO2 29 30  GLUCOSE 99 109*  BUN 7 8  CREATININE <0.30* <0.30*  CALCIUM 8.1* 8.1*    Studies/Results: Ct Chest Wo Contrast  06/19/2015  CLINICAL DATA:  Patient with weakness. Still with cough. Right-sided pleuritic chest pain. Patient has a fever. Diagnosed with pneumonia. Known pleural effusion. History of stage IV squamous cell carcinoma of the lung treated with right upper lobe resection and radiation therapy in September 2016. EXAM: CT CHEST WITHOUT CONTRAST TECHNIQUE: Multidetector  CT imaging of the chest was performed following the standard protocol without IV contrast. COMPARISON:  Previous day's chest radiograph.  Chest CT, 04/01/2015. FINDINGS: Neck base and axilla: Prominent to borderline enlarged neck base and axillary shotty lymph nodes. Largest on the right in the right axilla measuring 1 cm short axis. Visualized thyroid is unremarkable. Mediastinum and hila: Confluent soft tissue is seen along the right peritracheal mediastinum inseparable from the superior vena cava. This measures approximately 4.4 x 4.3 cm transversely. There multiple prominent shotty mediastinal lymph nodes. Abnormal soft tissue surrounds the right hilar structures contiguous with collapse/consolidated lung. No definite left hilar mass or adenopathy. Heart is normal in size. Mild prominence of the ascending aorta measuring 3.5 cm in diameter. Lungs and pleura: There is extensive consolidation on the right. The entire right lower lobe is opacified. There is opacification of much of the superiorly and dependent right upper lobe. Milder opacifications noted in the right middle lobe. There is ground-glass type opacification in a patchy distribution in the left upper lobe. Irregular focal opacities and dependent consolidation is seen in the left lower lobe. Moderate left and small pleural effusions are present. No convincing pulmonary edema. Segmental bronchi to the right lower lobe appear focally occluded. Limited upper abdomen: Well-defined low-density lesion in the lateral segment of the left liver lobe measuring 11 mm, decreased from 2.5 cm previously. Low-density lesion in the posterior segment the right lobe near the dome measures II cm, previously 1.3 cm. Musculoskeletal: Patchy areas of sclerosis are noted, most evident  involving the T7 and T12 vertebrae. Mild depression of the upper endplate of Y30. No osteolytic lesions. IMPRESSION: 1. Extensive areas of lung consolidation, right greater than left. Findings  are consistent with multifocal pneumonia. Some the opacity may be due to postobstructive consolidation. There is a masslike area of opacity along the right peritracheal mediastinum extending to the right hilum consistent with the malignancy noted on the prior chest CT. There multiple shotty lymph nodes in, also noted base and axilla which may be metastatic or reactive. 2. Moderate right small left pleural effusions. Lung consolidation and the pleural effusions are new from the prior CT. 3. Two discrete liver lesions noted in the lesion in the right lobe is larger than on prior CT. The lesion in the left lobe has decreased in size. 4. Areas of sclerosis thoracic spine is consistent with bony metastatic disease. Electronically Signed   By: Lajean Manes M.D.   On: 06/19/2015 11:03    Medications: I have reviewed the patient's current medications.  CODE STATUS: No CODE BLUE  Assessment/Plan: 1) metastatic non-small cell lung cancer, squamous cell carcinoma: Status post first line treatment with immunotherapy with evidence for disease progression and he was currently undergoing systemic chemotherapy with cisplatin, gemcitabine and Portrazza status post 1 cycle. Unfortunately his current condition is very poor and the patient may not be able to continue with any further treatment until improvement of his current infection and general condition. I had a lengthy discussion with the patient and his family and explained to them that the role of chemotherapy is palliative in nature and at best it may increase his survival by a few months. I strongly recommended for the patient to consider palliative care and hospice at this point. The patient and his family were in agreement with this decision. He will have a palliative care meeting later today. I will be happy to continue to be his attending for the hospice care. 2) bilateral pneumonia: Continue current treatment and support by the primary team and critical care  team. 3) CODE STATUS: No CODE BLUE.    LOS: 14 days    Bill Valdez K. 06/21/2015

## 2015-06-21 NOTE — Progress Notes (Signed)
Met with Bill Valdez, his sister, and his girlfriend. This family has experience multiple recent losses. Bill Valdez's mother died 25 month ago and 3 months ago his (nephew) sister's son died- close family, active grief. His goal is to have a comfortable and peaceful death- he is struggling with "things left undone" but expresses gratitude for his life and his family/friends. He desires and hospice facility for EOL care, and has complex symptom mangement needs. He is rapidly approaching EOL. Prognosis <2weeks, possible much less. Starting him on a Morphine PCA for his comfort-Will need to determine bed availability at hospice facilities- he wants to go ASAP, ready to get out of hospital and the ICU.  Will follow.  Lane Hacker, DO Palliative Medicine [phone]

## 2015-06-21 NOTE — Clinical Social Work Note (Signed)
Clinical Social Work Assessment  Patient Details  Name: Bill Valdez MRN: [id number] Date of Birth: 1960/06/11  Date of referral:  06/21/15               Reason for consult:  Discharge Planning, End of Life/Hospice, Facility Placement                Permission sought to share information with:  Chartered certified accountant granted to share information::  Yes, Verbal Permission Granted  Name::        Agency::     Relationship::     Contact Information:     Housing/Transportation Living arrangements for the past 2 months:  Single Family Home Source of Information:  Patient, Other (Comment Required) (sister, girlfriend) Patient Interpreter Needed:  None Criminal Activity/Legal Involvement Pertinent to Current Situation/Hospitalization:  No - Comment as needed Significant Relationships:  Siblings, Significant Other Lives with:  Self Do you feel safe going back to the place where you live?  No (Residential Hospice Home recommended.) Need for family participation in patient care:  Yes (Comment)  Care giving concerns:  Pt's care cannot be managed at home following hospital d/c.   Social Worker assessment / plan: Pt hospitalized on 06/07/15 with [facility]. CSW consulted to assist with d/c planning. Palliative care Team has been following to assist with Bill Valdez. Hospice home placement has been recommended. CSW has met with pt / sister, girlfriend to assist with disposition. Pt / family are in agreement with Residential [facility]ospice Home placement. Pt / family are willing to consider all hospice facilities that have openings. CSW has referred pt to [place] at [facility]. A decision is pending. CSW will contact facility in the am to check on referral status. Pt / family have been updated.  Employment status:  Unemployed Forensic scientist:  Managed Care PT Recommendations:  Not assessed at this time Information / Referral to community resources:  Other (Comment Required) (Hospice  Home)  Patient/Family's Response to care:  Pt / family are in agreement with plan for Residential Hospice Home placement.  Patient/Family's Understanding of and Emotional Response to Diagnosis, Current Treatment, and Prognosis:  Pt / family are aware of pt's medical status. Palliative Care Team has been following to offer support and assistance with GOC.  Emotional Assessment Appearance:  Appears stated age Attitude/Demeanor/Rapport:  Other (cooperative) Affect (typically observed):  Calm Orientation:  Oriented to Self, Oriented to Place, Oriented to  Time, Oriented to Situation Alcohol / Substance use:  Not Applicable Psych involvement (Current and /or in the community):  No (Comment)  Discharge Needs  Concerns to be addressed:  Discharge Planning Concerns Readmission within the last 30 days:  No Current discharge risk:  None Barriers to Discharge:  No Barriers Identified   Bill Valdez, [place] 06/21/2015, 4:04 PM

## 2015-06-21 NOTE — Progress Notes (Signed)
Patient ID: Bill Valdez, male   DOB: 11-15-1960, 55 y.o.   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 Julien Nordmann who presented to [facility] with reports of subjective fevers, nonproductive cough. He was found to be septic on the admission and was started on broad-spectrum antibiotics for possible pneumonia.  Patient's hospital course was complicated with ongoing hypoxia and need for BiPAP. He was transferred to stepdown 06/12/2015. Currently he is able to keep oxygen saturation above 90% with Ventimask. Appreciate oncology seeing the patient in consultation. Pulmonary is also assisting the management of patient's respiratory status.    Assessment/Plan:    Principal problem:   Acute respiratory failure with hypoxia ([place]) / Sepsis due to pneumonia [facility]) / healthcare associated pneumonia / Leukopenia / Leukocytosis - Sepsis presented on the admission with  Pneumonia, sinus tachycardia, hypoxia, lactic acidosis,  he required transfer to stepdown on 1/7 due to requiring bipap - Patient's chest x-ray most recently 06/12/2015 demonstrated improvement in right pleural effusion after right thoracentesis and improved bilateral lung volumes but heterogeneous opacities throughout the lungs are still persistent and relatively unchanged since prior studies -  He had thoracentesis 06/12/2015 with 1.2 L fluid removed, fluid culture from 1/7grow coag negative staph,  vanc d/ed, cytology no malignant cell seen. - He was initially on vancomycin and aztreonam, then added azithromycin 06/11/2015 since pneumonia was not improving and patient continued to be hypoxic. Azithromycin stopped 06/14/2015.  Aztreonam stopped on 1/11, cefepime started on 1/11. vanc stopped on 1/12, - Appreciate pulmonary team following and their recommendations  - We will continue symptomatic management with morphine for work of breathing - Blood cultures and respiratory cultures so far negative Strep pneumoniae is negative as well. - Continue to monitor in SDU as patient's oxygen requirements are still pretty high, back on NRB.    Active problems: Interstitial edema / Pleural effusion, right - Has gotten lasix 40 mg IV 1/6 and 1/7 - Status post thoracentesis 06/12/2015 with 1.2 L fluid removed.  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  - Has received palliative radiotherapy to the large right upper lobe lung mass followed by treatment with BMS 568 (nivolumab/ipilumumab) at [place], discontinued recently secondary to disease progression. - Pt was on systemic chemotherapy with cisplatin and gemcitabine  - Appreciate Dr. Julien Nordmann seeing the patient in consultation. Due to worsening respiratory status, clinically poor prognosis oncology recommend palliative care, possible hospice  -palliative care consulted, meeting today at noon.  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 Hospice? pending palliative team input  IV access:  Peripheral IV  Procedures and diagnostic studies:    Dg Chest 1 View 06/12/2015   No pneumothorax post thoracentesis.  US Thoracentesis Asp Pleural Space W/img Guide 06/12/2015   Successful ultrasound guided right thoracentesis yielding 1200 cc of pleural fluid. After the procedure was performed, just prior to transfer of the patient back to the floor, the patient became tachypneic. His pulse ox saturation droped to 70 percent. [place] became 062 with of systolic blood pressure of 114. The blood pressure was stable. He complained of dyspnea but remain did lucid throughout this episode. Rapid response was called in preparation for endotracheal intubation. The patient and family expressed wishes of do not resuscitate and refused endotracheal intubation. BiPAP support was instituted, then the patient was transferred to the ICU. Electronically Signed   By: Marybelle Killings M.D.   On: 06/12/2015 14:58   Dg Chest Portable 1 View 06/07/2015  1. Volume loss in the right hemithorax with right perihilar mass, right basilar opacity and pleural effusion. No interval exams since staging PET-CT, and findings may reflect a combination of post treatment related change, atelectasis or collapse, or pneumonia. 2. Patchy opacity at the left lung base is nonspecific, pneumonia is favored, however new metastatic disease not excluded radiographically.   Dg Chest Port 1 View 06/11/2015  Worsening diffuse interstitial prominence  throughout the lungs, right greater than left. This could represent edema or pneumonia. Focal left lower lobe opacity again noted, stable. Enlarging moderate right pleural effusion.   Medical Consultants:  None  Other Consultants:  Nutrition   IAnti-Infectives:   Aztreonam and vancomycin 06/07/2015 --> Azithromycin 06/11/2015 --> 06/14/2015   Ninoska Goswick, MD PhD [facility] Pager [phone]  Time spent in minutes: 25 minutes  If 7PM-7AM, please contact night-coverage [email or web address] Password [facility] 06/21/2015, 6:33 PM   LOS: 14 days    HPI/Subjective: Patient and family are ready for hospice, awaiting for hospice placement  Objective: Filed Vitals:   06/21/15 0425 06/21/15 0800 06/21/15 1103 06/21/15 1654  BP:   136/74 106/64  Pulse:      Temp: 98 F (36.7 C)     TempSrc: Axillary     Resp:  32 22   Height:      Weight:      SpO2:  84% 91% 94%    Intake/Output Summary (Last 24 hours) at 06/21/15 1833 Last data filed at 06/21/15 1800  Gross per 24 hour  Intake    690 ml  Output   2550 ml  Net  -1860 ml    Exam:   General:  Patient frail, mild tachypneic     Cardiovascular: slight sinus tachcardia, appreciate S1, S2   Respiratory: Diminished on the right, no wheezing   Abdomen: (+) BS, non tender   Extremities: UE swelling bilaterally, no leg edema  Neuro: Nonfocal   Data Reviewed: Basic Metabolic Panel:  Recent Labs Lab 06/16/15 0518 06/17/15 0630 06/18/15 0515 06/20/15 0530 06/21/15 0515  NA 133* 130* 130* 130* 132*  K 3.9 3.8 4.0 3.7 3.9  CL 94*  94* 94* 90* 92*  CO2 '29 30 28 29 30  '$ GLUCOSE 113* 102* 101* 99 109*  BUN '8 7 8 7 8  '$ CREATININE 0.33* 0.31* <0.30* <0.30* <0.30*  CALCIUM 8.0* 8.0* 8.1* 8.1* 8.1*  MG  --   --  1.8  --   --    Liver Function Tests:  Recent Labs Lab 06/18/15 0515  AST 27  ALT 14*  ALKPHOS 129*  BILITOT 0.7  PROT 5.5*  ALBUMIN 1.6*   No results for input(s): LIPASE, AMYLASE in the last 168 hours. No  results for input(s): AMMONIA in the last 168 hours. CBC:  Recent Labs Lab 06/16/15 0518 06/17/15 0630 06/18/15 0515 06/19/15 0500 06/20/15 0530  WBC 35.7* 28.2* 28.9* 22.7* 21.2*  HGB 8.9* 9.0* 9.2* 9.0* 8.9*  HCT 28.6* 28.3* 29.0* 28.1* 28.4*  MCV 87.5 89.3 89.5 89.2 89.3  PLT 552* 559* 620* 610* 580*   Cardiac Enzymes: No results for input(s): CKTOTAL, CKMB, CKMBINDEX, TROPONINI in the last 168 hours. BNP: Invalid input(s): POCBNP CBG: No results for input(s): GLUCAP in the last 168 hours.  Blood Culture (routine x 2)     Status: None   Collection Time: 06/07/15  9:45 PM  Result Value Ref Range Status   Specimen Description BLOOD PORTA CATH  Final   Special Requests BOTTLES DRAWN AEROBIC AND ANAEROBIC 5 ML  Final   Culture   Final    NO GROWTH 5 DAYS Performed at [facility]    Report Status 06/13/2015 FINAL  Final  Blood Culture (routine x 2)     Status: None   Collection Time: 06/07/15  9:46 PM  Result Value Ref Range Status   Specimen Description BLOOD RIGHT ANTECUBITAL  Final   Special Requests BOTTLES DRAWN AEROBIC AND ANAEROBIC 5 ML  Final   Culture   Final    NO GROWTH 5 DAYS Performed at [facility]    Report Status 06/13/2015 FINAL  Final  Urine culture     Status: None   Collection Time: 06/08/15  2:10 AM  Result Value Ref Range Status   Specimen Description URINE, CLEAN CATCH  Final   Special Requests NONE  Final   Culture   Final    NO GROWTH 1 DAY Performed at [facility]    Report Status 06/09/2015 FINAL  Final  Culture, respiratory (NON-Expectorated)     Status: None   Collection Time: 06/08/15 10:20 AM  Result Value Ref Range Status   Specimen Description SPUTUM  Final   Special Requests NONE  Final   Gram Stain   Final   Culture   Final    NORMAL OROPHARYNGEAL FLORA Performed at [facility]    Report Status 06/10/2015 FINAL  Final  Culture, expectorated sputum-assessment     Status: None   Collection  Time: 06/08/15 10:20 AM  Result Value Ref Range Status   Specimen Description SPUTUM  Final   Special Requests NONE  Final   Sputum evaluation   Final    THIS SPECIMEN IS ACCEPTABLE. RESPIRATORY CULTURE REPORT TO FOLLOW.   Report Status 06/08/2015 FINAL  Final  Culture, body fluid-bottle     Status: None (Preliminary result)   Collection Time: 06/12/15  3:11 PM  Result Value Ref Range Status   Specimen Description FLUID RIGHT PLEURAL  Final   Special Requests IN PEDIATRIC BOTTLE 5CC  Final   Gram Stain   Final    GRAM POSITIVE COCCI IN CLUSTERS  GRAM POSITIVE RODS CALLED TO EARLY,S RN 06/13/15 2047 [place]    Culture   Final    TOO YOUNG TO READ Performed at [facility]    Report Status PENDING  Incomplete  Gram stain     Status: None   Collection Time: 06/12/15  3:11 PM  Result Value Ref Range Status   Specimen Description FLUID RIGHT PLEURAL  Final   Special Requests NONE  Final   Gram Stain   Final    CYTOSPIN SMEAR WBC PRESENT,BOTH PMN AND MONONUCLEAR NO ORGANISMS SEEN Performed at [facility]    Report Status 06/13/2015 FINAL  Final  MRSA PCR Screening     Status: None   Collection Time: 06/12/15  3:12 PM  Result Value Ref Range Status   MRSA by PCR NEGATIVE NEGATIVE Final     Scheduled Meds: . antiseptic oral rinse  7 mL Mouth Rinse q12n4p  . benzonatate  100 mg Oral BID  . ceFEPime (MAXIPIME) IV  1 g Intravenous 3 times per day  . chlorhexidine  15 mL Mouth Rinse BID  . feeding supplement (ENSURE ENLIVE)  237 mL Oral BID BM  . metronidazole  500 mg Intravenous Q8H  . morphine   Intravenous 6 times per day  . sodium chloride  10-40 mL Intracatheter Q12H   Continuous Infusions: . sodium chloride 10 mL/hr at 06/20/15 2008

## 2015-06-22 ENCOUNTER — Other Ambulatory Visit: Payer: Self-pay | Admitting: Medical Oncology

## 2015-06-22 DIAGNOSIS — Z9889 Other specified postprocedural states: Secondary | ICD-10-CM

## 2015-06-22 DIAGNOSIS — R609 Edema, unspecified: Secondary | ICD-10-CM

## 2015-06-22 MED ORDER — ENSURE ENLIVE PO LIQD
237.0000 mL | Freq: Two times a day (BID) | ORAL | Status: AC
Start: 2015-06-22 — End: ?

## 2015-06-22 MED ORDER — BISACODYL 10 MG RE SUPP: 10.0000 mg | Freq: Every day | RECTAL | Status: AC | PRN

## 2015-06-22 MED ORDER — ALBUTEROL SULFATE (2.5 MG/3ML) 0.083% IN NEBU: 2.5000 mg | INHALATION_SOLUTION | RESPIRATORY_TRACT | Status: AC | PRN

## 2015-06-22 MED ORDER — BENZONATATE 100 MG PO CAPS: 100.0000 mg | ORAL_CAPSULE | Freq: Two times a day (BID) | ORAL | Status: AC

## 2015-06-22 MED ORDER — CETYLPYRIDINIUM CHLORIDE 0.05 % MT LIQD: 7.0000 mL | Freq: Two times a day (BID) | OROMUCOSAL | Status: AC

## 2015-06-22 NOTE — Progress Notes (Signed)
Pt will be d/c to [place] at [facility] today. Pt / family are in agreement with this plan. [facility] transport is required and medical necessity form completed. DNR is in packet. [place] liaison from hospice home has access to d/c summary and will send to hospice home for review.   Werner Lean LCSW [phone]

## 2015-06-22 NOTE — Progress Notes (Signed)
Hospice Home at [facility] has an opening for pt today. MD alerted. Pt / family requesting dc to Hospice Home. CSW will follow to assist with d/c planning needs.  Werner Lean LCSW [phone]

## 2015-06-22 NOTE — Progress Notes (Signed)
Report given to Barnett Applebaum RN at [facility]. Will keep condom cath, access to port, and oxygen in place during transport per hospice nurse. Will give bolus PCA dolus before discontinuing PCA from patient prior to transport per Dr. Erlinda Hong and Hilma Favors.

## 2015-06-22 NOTE — Discharge Summary (Signed)
Discharge Summary  Bill Valdez EHM:[id number] DOB: 1961/01/21  PCP: [name], MD  Admit date: 06/07/2015 Discharge date: 06/22/2015  Time spent: >33mns  Recommendations for Outpatient Follow-up:  1. Patient is discharged to hospice facility  Discharge Diagnoses:  Active Hospital Problems   Diagnosis Date Noted  . Acute respiratory failure with hypoxia ([place] 06/12/2015  . Palliative care status   . Aspiration pneumonia of left lower lobe ([place]   . Encounter for palliative care   . Pneumothorax   . Pressure ulcer 06/17/2015  . S/P thoracentesis   . Leukopenia 06/12/2015  . Leukocytosis 06/12/2015  . Severe protein-calorie malnutrition ([place] 06/12/2015  . Anemia of chronic disease 06/12/2015  . Pleural effusion, right 06/12/2015  . Hypokalemia 06/12/2015  . Edema of upper extremity 06/12/2015  . Interstitial edema 06/12/2015  . 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 Sepsis due to pneumonia ([place] 06/11/2015  . HCAP (healthcare-associated pneumonia) 06/07/2015  . Hyponatremia 06/07/2015  . Bone metastasis ([place] 02/26/2015  . Brain metastasis ([place] 02/26/2015  . Cancer of upper lobe of right lung ([place] 02/19/2015    Resolved Hospital Problems   Diagnosis Date Noted Date Resolved  No resolved problems to display.    Discharge Condition: stable  Diet recommendation: diet as tolerated  Filed Weights   06/16/15 0400 06/17/15 0400 06/18/15 0400  Weight: 71.6 kg (157 lb 13.6 oz) 69.4 kg (153 lb) 70 kg (154 lb 5.2 oz)    History of present illness:  JGiuseppe Ducheminis a 55y.o. male w no sig PMH who presented in Sept '16 with wt loss, SOB and chest pain. Dx'd with stage 4 lung cancer and [facility] Has liver mets , SVC syndrome, numerous bony mets. Presenting today w SOB of one day's duration. CXR here shows new RLL consolidation/ effusion and patchy LLL infiltrate. Temp is 102 deg, WBC 3. Asked to see for admission for PNA.    Other issues are chronic cough rx with tussionex, constipation rx with bid Senna and prn miralax, nausea Rx w zofran. Bone pain rx with tid prn ibuprofen. Sister and wife are here with him today.    In Sept '16 pt presented w 2 mo hx of SOB, wt loss and left chest pain. CXR showed RUL suprahilar mass. CT showed 7 cm RUL mass invading mediastinum invading the SVC, prob tumor thrombus in R IJ vein and L BC vein, also in azygous. Also liver mets. Smoked 2 ppd for 30 yrs, quit 2012. 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 Biopsy done and was dx's with Stage 4 (T3, N2, M1 B) non-small cell lung cancer. [facility] so now he is being treated by Dr MJulien Nordmannwith cisplatin/gemcitabine and PDoree Barthel Initial studies didn't show bone mets but subsequent studies showed numerous bony mets. Has had pall XRT to lung mass, also to a hip lesion and has had some cranial XRT.     Hospital Course:  Principal Problem:   Acute respiratory failure with hypoxia ([place] Active Problems:   Cancer of upper lobe of right lung (HCC)   Brain metastasis (HCC)   Bone metastasis ([place]   HCAP (healthcare-associated pneumonia)   Hyponatremia   Sepsis due to pneumonia ([place]   Leukopenia   Leukocytosis   Severe protein-calorie malnutrition (HCC)   Anemia of chronic disease   Pleural effusion, right   Hypokalemia   Edema of upper extremity   Interstitial edema  SVC syndrome   S/P thoracentesis   Pressure ulcer   Pneumothorax   Aspiration pneumonia of left lower lobe (HCC)   Encounter for palliative care   Palliative care status  Acute respiratory failure with hypoxia ([place]) / Sepsis due to pneumonia [facility]) / healthcare associated pneumonia / Leukopenia / Leukocytosis - Sepsis presented on the admission with Pneumonia, sinus tachycardia, hypoxia, lactic acidosis, he required transfer to stepdown on 1/7 due to requiring bipap - Patient's chest x-ray most recently  06/12/2015 demonstrated improvement in right pleural effusion after right thoracentesis and improved bilateral lung volumes but heterogeneous opacities throughout the lungs are still persistent and relatively unchanged since prior studies -s/p  thoracentesis 06/12/2015 with 1.2 L fluid removed, fluid culture from 1/7grow coag negative staph, vanc d/ed, cytology no malignant cell seen. - He was initially on vancomycin and aztreonam, then added azithromycin 06/11/2015 since pneumonia was not improving and patient continued to be hypoxic. Azithromycin stopped 06/14/2015. Aztreonam stopped on 1/11, cefepime started on 1/11. vanc stopped on 1/12, - Appreciate pulmonary team following and their recommendations  - We will continue symptomatic management with morphine for work of breathing - Blood cultures and respiratory cultures so far negative Strep pneumoniae is negative as well. - patient now decided want to be on full comfort measures and discharge to hospice facility. He was started on pca pump and hospice will continue adjust pca pump care for comfort.   Interstitial edema / Pleural effusion, right - Has gotten lasix 40 mg IV 1/6 and 1/7 - Status post thoracentesis 06/12/2015 with 1.2 L fluid removed.  -repeat cxr on 1/11, persistent large right pleural effusion with near confluent insterstitial opacities, stable interstitial densities throughout left lung, repeat thoracentesis on 1/11, -started daily lasix on 1/13, -patient  Declined chest tube drainage, now on full comfort measures and discharge to hospice facility  Cancer of upper lobe of right lung [facility]) / Brain metastasis ([place]) / Bone metastasis ([place]) / Edema of upper extremity due to SVC syndrome  - Pt presented with right upper lobe suprahilar mass with questionable SVC syndrome in addition to mediastinal lymphadenopathy with extensive bone and liver metastases in September 2016.  - Has received palliative radiotherapy to the large  right upper lobe lung mass followed by treatment with BMS 568 (nivolumab/ipilumumab) at [place], discontinued recently secondary to disease progression. - Pt was on systemic chemotherapy with cisplatin and gemcitabine  - Appreciate Dr. Julien Nordmann seeing the patient in consultation. 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 and poor oral intake - Sodium slowly improving from 126 --> 131 on 06/14/2015   Pancytopenia / Anemia of chronic disease  - [place] of chemotherapy and history of malignancy - Hemoglobin is stable at 9.1, platelets are WNL   Severe protein calorie malnutrition - In the context of chronic illness - Continue nutritional supplementation as tolerated    Code Status: DNR/DNI Family Communication: plan of care discussed with the patient   Discharge Exam: BP 101/58 mmHg  Pulse 120  Temp(Src) 97.5 F (36.4 C) (Axillary)  Resp 14  Ht '5\' 11"'$  (1.803 m)  Wt 70 kg (154 lb 5.2 oz)  BMI 21.53 kg/m2  SpO2 91%   General: Patient frail, mild tachypneic   Cardiovascular: slight sinus tachcardia, appreciate S1, S2   Respiratory: Diminished on the right, no wheezing   Abdomen: (+) BS,  non tender   Extremities: UE swelling bilaterally, no leg edema  Neuro: Nonfocal   Discharge Instructions You were cared for by a hospitalist during your hospital stay. If you have any questions about your discharge medications or the care you received while you were in the hospital after you are discharged, you can call the unit and asked to speak with the hospitalist on call if the hospitalist that took care of you is not available. Once you are discharged, your primary care physician will handle any further medical issues. Please note that NO REFILLS for any discharge  medications will be authorized once you are discharged, as it is imperative that you return to your primary care physician (or establish a relationship with a primary care physician if you do not have one) for your aftercare needs so that they can reassess your need for medications and monitor your lab values.  Discharge Instructions    Diet - low sodium heart healthy    Complete by:  As directed      Discharge instructions    Complete by:  As directed   Continue PCA at hospice facility     Increase activity slowly    Complete by:  As directed             Medication List    STOP taking these medications        doxycycline 100 MG tablet  Commonly known as:  VIBRA-TABS     ibuprofen 600 MG tablet  Commonly known as:  ADVIL,MOTRIN      TAKE these medications        albuterol (2.5 MG/3ML) 0.083% nebulizer solution  Commonly known as:  PROVENTIL  Take 3 mLs (2.5 mg total) by nebulization every 4 (four) hours as needed for wheezing or shortness of breath.     antiseptic oral rinse 0.05 % Liqd solution  Commonly known as:  CPC / CETYLPYRIDINIUM CHLORIDE 0.05%  7 mLs by Mouth Rinse route 2 times daily at 12 noon and 4 pm.     benzonatate 100 MG capsule  Commonly known as:  TESSALON  Take 1 capsule (100 mg total) by mouth 2 (two) times daily.     bisacodyl 10 MG suppository  Commonly known as:  DULCOLAX  Place 1 suppository (10 mg total) rectally daily as needed for moderate constipation.     chlorpheniramine-HYDROcodone 10-8 MG/5ML Suer  Commonly known as:  TUSSIONEX  Take 5 milliliters by mouth every 12 hours if needed for cough for     feeding supplement (ENSURE ENLIVE) Liqd  Take 237 mLs by mouth 2 (two) times daily between meals.     lidocaine-prilocaine cream  Commonly known as:  EMLA  Apply 1 application topically as needed.     ondansetron 8 MG tablet  Commonly known as:  ZOFRAN  Take 1 tablet (8 mg total) by mouth every 8 (eight) hours as needed for nausea or  vomiting.     polyethylene glycol packet  Commonly known as:  MIRALAX / GLYCOLAX  Take 17 g by mouth daily as needed for mild constipation.     prochlorperazine 10 MG tablet  Commonly known as:  COMPAZINE  Take 1 tablet (10 mg total) by mouth every 6 (six) hours as needed for nausea or vomiting.     sennosides-docusate sodium 8.6-50 MG tablet  Commonly known as:  SENOKOT-S  Take 1 tablet by mouth 2 (two) times daily.       Allergies  Allergen Reactions  .  Penicillins Other (See Comments)    Childhood Has patient had a PCN reaction causing immediate rash, facial/tongue/throat swelling, SOB or lightheadedness with hypotension: No. Has patient had a PCN reaction causing severe rash involving mucus membranes or skin necrosis: No Has patient had a PCN reaction that required hospitalization No Has patient had a PCN reaction occurring within the last 10 years: No If all of the above answers are "NO", then may proceed with Cephalospor      The results of significant diagnostics from this hospitalization (including imaging, microbiology, ancillary and laboratory) are listed below for reference.    Significant Diagnostic Studies: Dg Chest 1 View  06/12/2015  CLINICAL DATA:  Status post thoracentesis EXAM: CHEST 1 VIEW COMPARISON:  Yesterday FINDINGS: The right pleural effusion has improved after right thoracentesis. Overall bilateral lung volumes are improved. Heterogeneous opacities throughout both lungs persist and are not significantly changed. Left jugular Port-A-Cath is stable. There is no pneumothorax. IMPRESSION: No pneumothorax post thoracentesis. Electronically Signed   By: Marybelle Killings M.D.   On: 06/12/2015 14:55   Ct Chest Wo Contrast  06/19/2015  CLINICAL DATA:  Patient with weakness. Still with cough. Right-sided pleuritic chest pain. Patient has a fever. Diagnosed with pneumonia. Known pleural effusion. History of stage IV squamous cell carcinoma of the lung treated with right  upper lobe resection and radiation therapy in September 2016. EXAM: CT CHEST WITHOUT CONTRAST TECHNIQUE: Multidetector CT imaging of the chest was performed following the standard protocol without IV contrast. COMPARISON:  Previous day's chest radiograph.  Chest CT, 04/01/2015. FINDINGS: Neck base and axilla: Prominent to borderline enlarged neck base and axillary shotty lymph nodes. Largest on the right in the right axilla measuring 1 cm short axis. Visualized thyroid is unremarkable. Mediastinum and hila: Confluent soft tissue is seen along the right peritracheal mediastinum inseparable from the superior vena cava. This measures approximately 4.4 x 4.3 cm transversely. There multiple prominent shotty mediastinal lymph nodes. Abnormal soft tissue surrounds the right hilar structures contiguous with collapse/consolidated lung. No definite left hilar mass or adenopathy. Heart is normal in size. Mild prominence of the ascending aorta measuring 3.5 cm in diameter. Lungs and pleura: There is extensive consolidation on the right. The entire right lower lobe is opacified. There is opacification of much of the superiorly and dependent right upper lobe. Milder opacifications noted in the right middle lobe. There is ground-glass type opacification in a patchy distribution in the left upper lobe. Irregular focal opacities and dependent consolidation is seen in the left lower lobe. Moderate left and small pleural effusions are present. No convincing pulmonary edema. Segmental bronchi to the right lower lobe appear focally occluded. Limited upper abdomen: Well-defined low-density lesion in the lateral segment of the left liver lobe measuring 11 mm, decreased from 2.5 cm previously. Low-density lesion in the posterior segment the right lobe near the dome measures II cm, previously 1.3 cm. Musculoskeletal: Patchy areas of sclerosis are noted, most evident involving the T7 and T12 vertebrae. Mild depression of the upper endplate  of K93. No osteolytic lesions. IMPRESSION: 1. Extensive areas of lung consolidation, right greater than left. Findings are consistent with multifocal pneumonia. Some the opacity may be due to postobstructive consolidation. There is a masslike area of opacity along the right peritracheal mediastinum extending to the right hilum consistent with the malignancy noted on the prior chest CT. There multiple shotty lymph nodes in, also noted base and axilla which may be metastatic or reactive. 2. Moderate right small left  pleural effusions. Lung consolidation and the pleural effusions are new from the prior CT. 3. Two discrete liver lesions noted in the lesion in the right lobe is larger than on prior CT. The lesion in the left lobe has decreased in size. 4. Areas of sclerosis thoracic spine is consistent with bony metastatic disease. Electronically Signed   By: Lajean Manes M.D.   On: 06/19/2015 11:03   Dg Chest Port 1 View  06/18/2015  CLINICAL DATA:  Acute respiratory failure with hypoxemia EXAM: PORTABLE CHEST 1 VIEW COMPARISON:  Two days ago FINDINGS: There is a left-sided porta catheter, tip in stable position. Right pneumothorax is no longer visualized. Right pleural fluid has re- accumulated. Bilateral airspace disease, more extensive on the right. Underlying COPD with interstitial coarsening. The right lower lobe is obstructed and collapse due to hilar mass. Normal heart size. IMPRESSION: 1. Increasing right pleural effusion. Right pneumothorax is no longer seen. 2. Bilateral pneumonia. Postobstructive lung opacification on the right. Electronically Signed   By: Monte Fantasia M.D.   On: 06/18/2015 09:50   Dg Chest Port 1 View  06/16/2015  CLINICAL DATA:  Followup right pneumothorax. Metastatic right lung carcinoma. Healthcare associated pneumonia and sepsis. EXAM: PORTABLE CHEST 1 VIEW COMPARISON:  04/15/2016 FINDINGS: Tiny less than 10% right lateral pneumothorax again demonstrated as well as small right  pleural effusion. Asymmetric airspace disease is again seen involving right lung greater than left, without significant change. Heart size is stable. Left-sided power port remains in appropriate position. IMPRESSION: Stable small right hydropneumothorax. No significant change in asymmetric airspace disease. Electronically Signed   By: Earle Gell M.D.   On: 06/16/2015 16:02   Dg Chest Port 1 View  06/16/2015  CLINICAL DATA:  Status post thoracentesis EXAM: PORTABLE CHEST - 1 VIEW COMPARISON:  06/16/2015 FINDINGS: Cardiac shadow is stable. A left chest wall port is again seen. There is been interval right-sided thoracentesis. Minimal right lateral pneumothorax is noted. Diffuse infiltrates are noted throughout the right lung as well as some diffuse interstitial changes in the left lung. This is stable from the prior study. IMPRESSION: Minimal lateral pneumothorax following thoracentesis. There has been significant reduction in pleural fluid when compared with the prior exam. These results were called by telephone at the time of interpretation on 06/16/2015 at 12:27 pm to [facility] of the patients nurse, who verbally acknowledged these results and will notify the performing physician. Electronically Signed   By: Inez Catalina M.D.   On: 06/16/2015 12:29   Dg Chest Port 1 View  06/16/2015  CLINICAL DATA:  Respiratory failure, right upper lung malignancy with distant metastases, healthcare associated pneumonia, sepsis, right pleural effusion EXAM: PORTABLE CHEST 1 VIEW COMPARISON:  Portable chest x-ray of June 14, 2015 FINDINGS: There remains volume loss on the right with large pleural effusion. Interstitial opacities with areas of confluence persist in the right mid and upper lung. On the left there is stable interstitial opacities. There is no left pleural effusion. The heart is normal in size. The pulmonary vascularity is not engorged. The Port-A-Cath appliance tip projects over the distal third of the SVC.  IMPRESSION: Allowing for differences in positioning there has not been significant interval change in the appearance of the chest since the study of 2 days ago. Persistent large right pleural effusion with near confluent interstitial opacities. Stable interstitial densities throughout much of the left lung. Electronically Signed   By: David  Martinique M.D.   On: 06/16/2015 07:12   Dg Chest  Port 1 View  06/14/2015  CLINICAL DATA:  Acute respiratory failure. EXAM: PORTABLE CHEST 1 VIEW COMPARISON:  06/12/2015 and CT chest 02/18/2015. FINDINGS: Trachea is midline. Left IJ power port tip projects over the low SVC or SVC RA junction, stable. Right hilar soft tissue prominence, stable. Mixed interstitial and airspace opacification, right greater than left, similar to 06/12/2015. Biapical pleural thickening. Moderate right pleural effusion. Healing lower left lateral rib fracture. IMPRESSION: 1. Low right paratracheal adenopathy. 2. Mixed interstitial and airspace opacification, right greater than left, possibly due to edema or pneumonia, stable 3. Moderate right pleural effusion. Electronically Signed   By: Lorin Picket M.D.   On: 06/14/2015 15:10   Dg Chest Port 1 View  06/11/2015  CLINICAL DATA:  Shortness of breath, cough today. Follow-up pneumonia. History of lung cancer. EXAM: PORTABLE CHEST 1 VIEW COMPARISON:  06/07/2015 FINDINGS: Left Port-A-Cath remains in place, unchanged. Right suprahilar mass again noted. Increasing interstitial opacities throughout both lungs, right greater than left. Focal opacity in the left lung base again noted. Moderate right pleural effusion, also enlarging. Heart size appears normal. IMPRESSION: Worsening diffuse interstitial prominence throughout the lungs, right greater than left. This could represent edema or pneumonia. Focal left lower lobe opacity again noted, stable. Enlarging moderate right pleural effusion. Electronically Signed   By: Rolm Baptise M.D.   On: 06/11/2015 10:29    Dg Chest Portable 1 View  06/07/2015  CLINICAL DATA:  Worsening shortness of breath today and fever. Metastatic lung cancer with metastasis to liver, brain and bone. EXAM: PORTABLE CHEST 1 VIEW COMPARISON:  Most recent imaging PET-CT 02/23/2015 FINDINGS: Tip of the left chest port in the SVC. Right suprahilar mass again seen, with surrounding irregular opacity. Volume loss in the right hemithorax with elevation of right hemidiaphragm and right pleural effusion. Diffuse multifocal opacities throughout the lower right hemithorax. There is an ill-defined patchy opacity at the left lung base. Mediastinal contours are grossly unchanged allowing for differences in technique. Callus formation noted about left lateral rib fracture. IMPRESSION: 1. Volume loss in the right hemithorax with right perihilar mass, right basilar opacity and pleural effusion. No interval exams since staging PET-CT, and findings may reflect a combination of post treatment related change, atelectasis or collapse, or pneumonia. 2. Patchy opacity at the left lung base is nonspecific, pneumonia is favored, however new metastatic disease not excluded radiographically. Electronically Signed   By: Jeb Levering M.D.   On: 06/07/2015 21:29   US Thoracentesis Asp Pleural Space W/img Guide  06/12/2015  CLINICAL DATA:  Right pleural effusion EXAM: ULTRASOUND GUIDED RIGHT THORACENTESIS COMPARISON:  None. PROCEDURE: An ultrasound guided thoracentesis was thoroughly discussed with the patient and questions answered. The benefits, risks, alternatives and complications were also discussed. The patient understands and wishes to proceed with the procedure. Written consent was obtained. Ultrasound was performed to localize and mark an adequate pocket of fluid in the right chest. The area was then prepped and draped in the normal sterile fashion. 1% Lidocaine was used for local anesthesia. Under ultrasound guidance a Safe-T-Centesis catheter was introduced.  Thoracentesis was performed. The catheter was removed and a dressing applied. COMPLICATIONS: None. FINDINGS: A total of approximately 1200 cc of clear yellow fluid was removed. A fluid sample was sent for laboratory analysis. IMPRESSION: Successful ultrasound guided right thoracentesis yielding 1200 cc of pleural fluid. After the procedure was performed, just prior to transfer of the patient back to the floor, the patient became tachypneic. His pulse ox saturation droped  to 70 percent. [place] became 563 with of systolic blood pressure of 114. The blood pressure was stable. He complained of dyspnea but remain did lucid throughout this episode. Rapid response was called in preparation for endotracheal intubation. The patient and family expressed wishes of do not resuscitate and refused endotracheal intubation. BiPAP support was instituted, then the patient was transferred to the ICU. Electronically Signed   By: Marybelle Killings M.D.   On: 06/12/2015 14:58    Microbiology: Recent Results (from the past 240 hour(s))  Culture, body fluid-bottle     Status: None   Collection Time: 06/12/15  3:11 PM  Result Value Ref Range Status   Specimen Description FLUID RIGHT PLEURAL  Final   Special Requests IN PEDIATRIC BOTTLE 5CC  Final   Gram Stain   Final    GRAM POSITIVE COCCI IN CLUSTERS GRAM POSITIVE RODS CALLED TO EARLY,S RN 06/13/15 2047 [place]    Culture   Final    STAPHYLOCOCCUS SPECIES (COAGULASE NEGATIVE) NON-FERMENTATIVE GRAM NEGATIVE RODS Performed at [facility]    Report Status 06/18/2015 FINAL  Final   Organism ID, Bacteria STAPHYLOCOCCUS SPECIES (COAGULASE NEGATIVE)  Final      Susceptibility   Staphylococcus species (coagulase negative) - MIC*    CIPROFLOXACIN <=0.5 SENSITIVE Sensitive     ERYTHROMYCIN <=0.25 SENSITIVE Sensitive     GENTAMICIN <=0.5 SENSITIVE Sensitive     OXACILLIN <=0.25 SENSITIVE Sensitive     TETRACYCLINE <=1 SENSITIVE Sensitive     VANCOMYCIN 2 SENSITIVE  Sensitive     TRIMETH/SULFA <=10 SENSITIVE Sensitive     CLINDAMYCIN <=0.25 SENSITIVE Sensitive     RIFAMPIN <=0.5 SENSITIVE Sensitive     Inducible Clindamycin NEGATIVE Sensitive     * STAPHYLOCOCCUS SPECIES (COAGULASE NEGATIVE)  Gram stain     Status: None   Collection Time: 06/12/15  3:11 PM  Result Value Ref Range Status   Specimen Description FLUID RIGHT PLEURAL  Final   Special Requests NONE  Final   Gram Stain   Final    CYTOSPIN SMEAR WBC PRESENT,BOTH PMN AND MONONUCLEAR NO ORGANISMS SEEN Performed at [facility]    Report Status 06/13/2015 FINAL  Final  MRSA PCR Screening     Status: None   Collection Time: 06/12/15  3:12 PM  Result Value Ref Range Status   MRSA by PCR NEGATIVE NEGATIVE Final    Comment:        The GeneXpert MRSA Assay (FDA approved for NASAL specimens only), is one component of a comprehensive MRSA colonization surveillance program. It is not intended to diagnose MRSA infection nor to guide or monitor treatment for MRSA infections.   Body fluid culture     Status: None   Collection Time: 06/16/15 10:00 AM  Result Value Ref Range Status   Specimen Description PLEURAL  Final   Special Requests NONE  Final   Gram Stain   Final    MODERATE WBC PRESENT,BOTH PMN AND MONONUCLEAR NO ORGANISMS SEEN    Culture   Final    NO GROWTH 3 DAYS Performed at [facility]    Report Status 06/19/2015 FINAL  Final     Labs: Basic Metabolic Panel:  Recent Labs Lab 06/16/15 0518 06/17/15 0630 06/18/15 0515 06/20/15 0530 06/21/15 0515  NA 133* 130* 130* 130* 132*  K 3.9 3.8 4.0 3.7 3.9  CL 94* 94* 94* 90* 92*  CO2 '29 30 28 29 30  '$ GLUCOSE 113* 102* 101* 99 109*  BUN  $'8 7 8 7 8  'J$ CREATININE 0.33* 0.31* <0.30* <0.30* <0.30*  CALCIUM 8.0* 8.0* 8.1* 8.1* 8.1*  MG  --   --  1.8  --   --    Liver Function Tests:  Recent Labs Lab 06/18/15 0515  AST 27  ALT 14*  ALKPHOS 129*  BILITOT 0.7  PROT 5.5*  ALBUMIN 1.6*   No results for  input(s): LIPASE, AMYLASE in the last 168 hours. No results for input(s): AMMONIA in the last 168 hours. CBC:  Recent Labs Lab 06/16/15 0518 06/17/15 0630 06/18/15 0515 06/19/15 0500 06/20/15 0530  WBC 35.7* 28.2* 28.9* 22.7* 21.2*  HGB 8.9* 9.0* 9.2* 9.0* 8.9*  HCT 28.6* 28.3* 29.0* 28.1* 28.4*  MCV 87.5 89.3 89.5 89.2 89.3  PLT 552* 559* 620* 610* 580*   Cardiac Enzymes: No results for input(s): CKTOTAL, CKMB, CKMBINDEX, TROPONINI in the last 168 hours. BNP: BNP (last 3 results) No results for input(s): BNP in the last 8760 hours.  ProBNP (last 3 results) No results for input(s): PROBNP in the last 8760 hours.  CBG: No results for input(s): GLUCAP in the last 168 hours.     SignedFlorencia Reasons MD, PhD  [facility] 06/22/2015, 1:27 PM

## 2015-06-22 NOTE — Progress Notes (Signed)
CSW assisting with d/c planning. [facility] will send a nurse this am to assess pt for placement.   Werner Lean LCSW [phone]

## 2015-06-23 ENCOUNTER — Other Ambulatory Visit: Payer: BLUE CROSS/BLUE SHIELD

## 2015-06-23 ENCOUNTER — Ambulatory Visit: Payer: BLUE CROSS/BLUE SHIELD | Admitting: Internal Medicine

## 2015-06-23 ENCOUNTER — Ambulatory Visit: Payer: BLUE CROSS/BLUE SHIELD

## 2015-06-23 ENCOUNTER — Encounter: Payer: BLUE CROSS/BLUE SHIELD | Admitting: Nutrition

## 2015-08-04 DEATH — deceased

## 2015-11-20 ENCOUNTER — Other Ambulatory Visit: Payer: Self-pay | Admitting: Nurse Practitioner

## 2015-12-06 ENCOUNTER — Other Ambulatory Visit: Payer: Self-pay | Admitting: Nurse Practitioner

## 2016-02-24 ENCOUNTER — Encounter: Payer: Self-pay | Admitting: Radiation Therapy

## 2016-02-24 NOTE — Progress Notes (Signed)
  Bill Valdez, age 55, passed away at the [facility] [place] on Sunday2017/03/07.

## 2016-04-21 IMAGING — US US THORACENTESIS ASP PLEURAL SPACE W/IMG GUIDE
1 series · 4 of 4 positions shown · non-contrast
Comparison: None.

CLINICAL DATA: Right pleural effusion

EXAM:
ULTRASOUND GUIDED RIGHT THORACENTESIS

[Series 1: us thoracentesis asp pleural space w/img guide · 0.30mm/px · 4 of 4 slices shown]
[im 1/4]
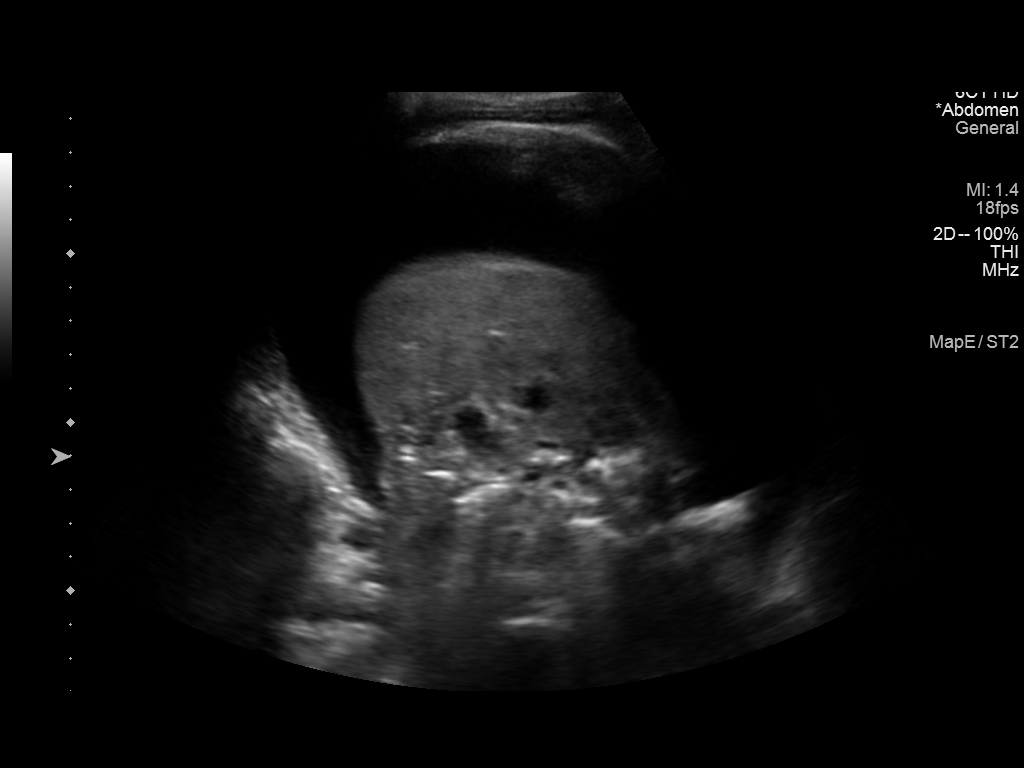
[im 2/4]
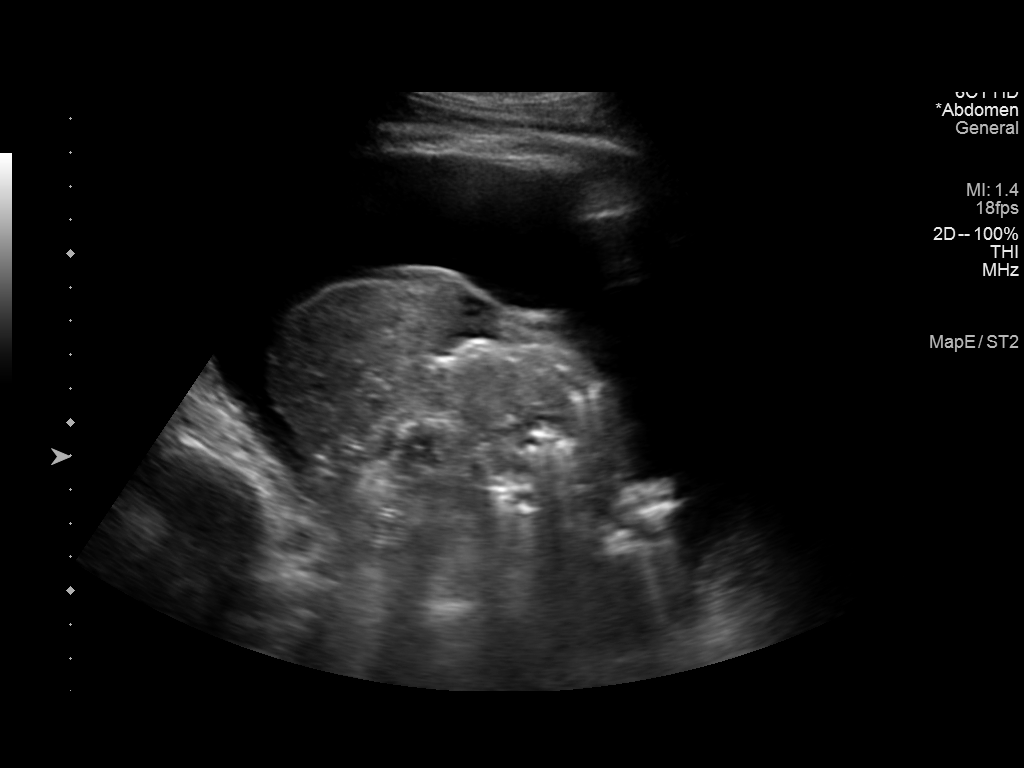
[im 3/4]
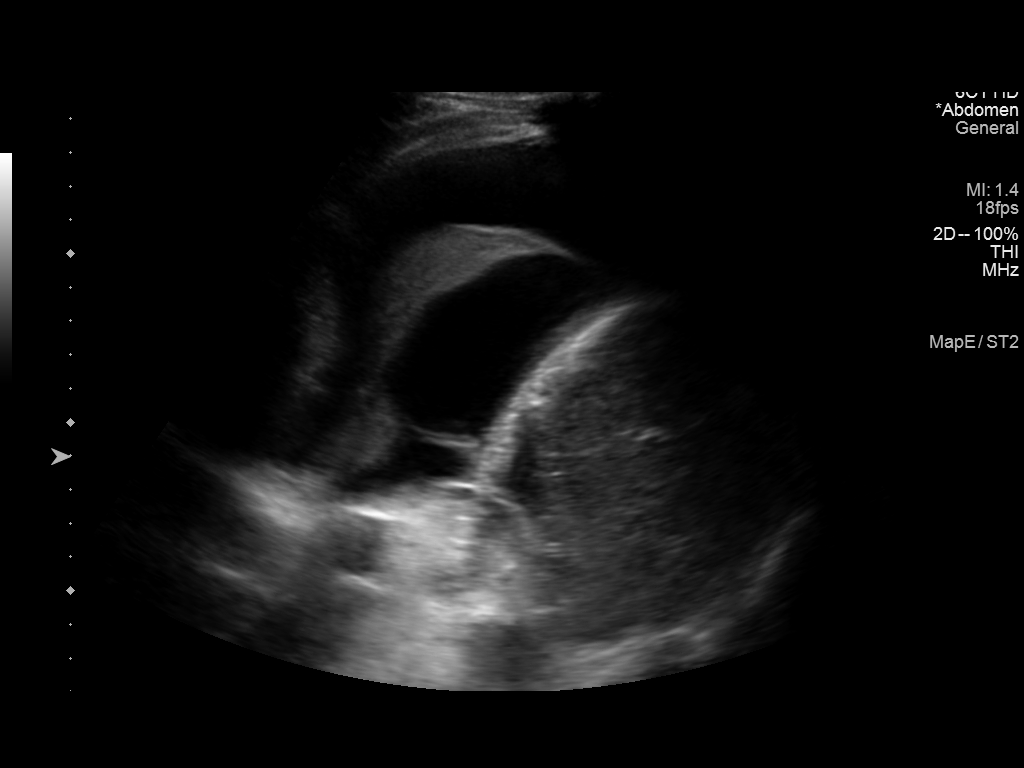
[im 4/4]
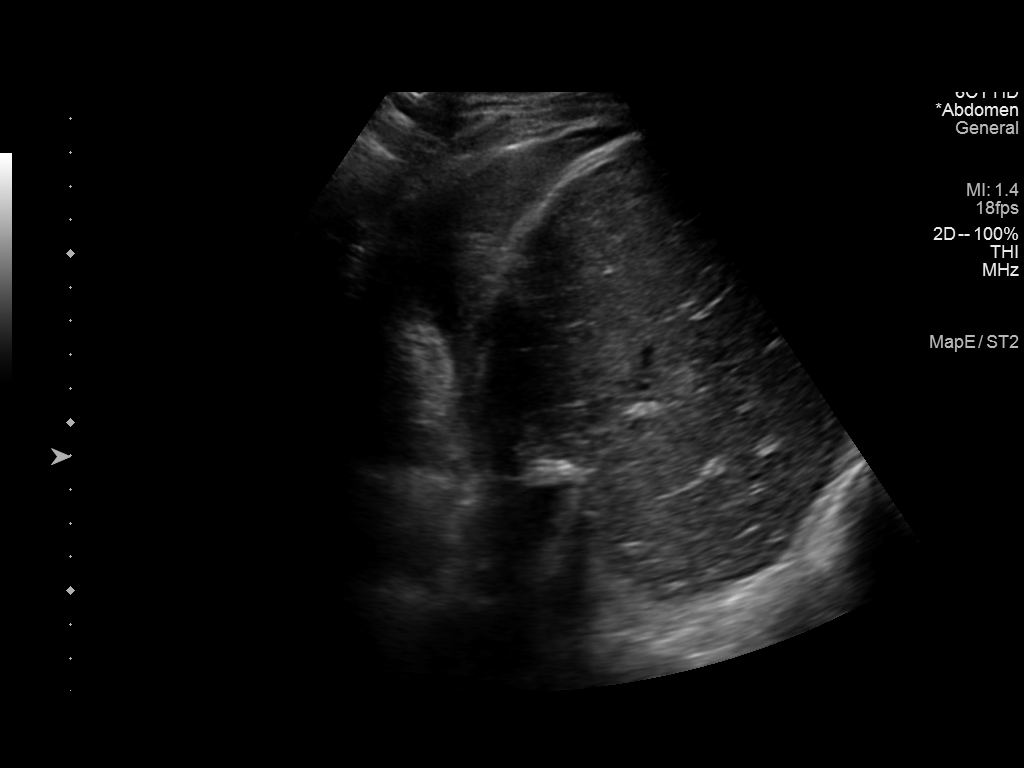

[4 of 4 positions shown; findings below may reference images not displayed]

PROCEDURE:
An ultrasound guided thoracentesis was thoroughly discussed with the
patient and questions answered. The benefits, risks, alternatives
and complications were also discussed. The patient understands and
wishes to proceed with the procedure. Written consent was obtained.

Ultrasound was performed to localize and mark an adequate pocket of
fluid in the right chest. The area was then prepped and draped in
the normal sterile fashion. 1% Lidocaine was used for local
anesthesia. Under ultrasound guidance a Safe-T-Centesis catheter was
introduced. Thoracentesis was performed. The catheter was removed
and a dressing applied.

COMPLICATIONS:
None.
FINDINGS: A total of approximately 1599 cc of clear yellow fluid was removed.
A fluid sample was sent for laboratory analysis.
IMPRESSION: Successful ultrasound guided right thoracentesis yielding 1599 cc of
pleural fluid.

After the procedure was performed, just prior to transfer of the
patient back to the floor, the patient became tachypneic. His pulse
ox saturation droped to 70 percent. Poles became 140 with of
systolic blood pressure of 114. The blood pressure was stable. He
complained of dyspnea but remain did lucid throughout this episode.
Rapid response was called in preparation for endotracheal
intubation. The patient and family expressed wishes of do not
resuscitate and refused endotracheal intubation. BiPAP support was
instituted, then the patient was transferred to the ICU.

## 2016-04-21 IMAGING — DX DG CHEST 1V
1 series · 1 of 1 positions shown · non-contrast
Comparison: Yesterday

CLINICAL DATA: Status post thoracentesis

EXAM:
CHEST 1 VIEW

[chest ap]
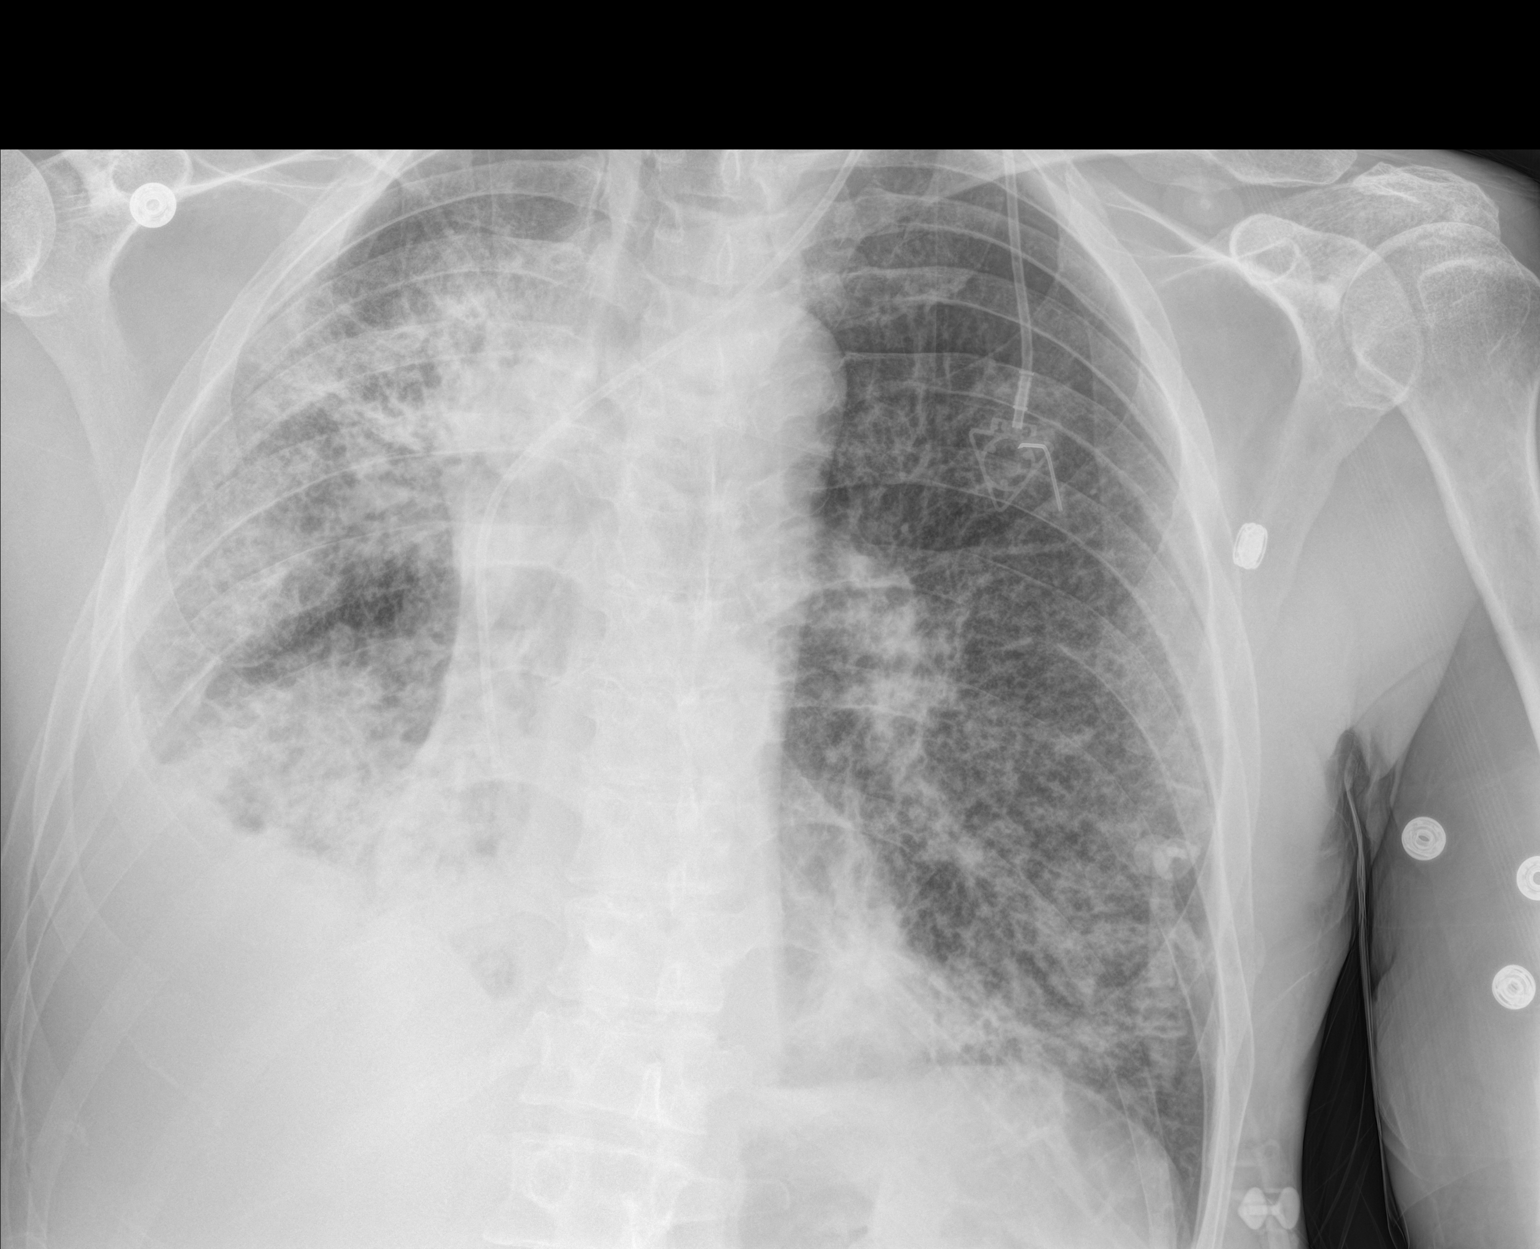

[1 of 1 positions shown; findings below may reference images not displayed]

FINDINGS: The right pleural effusion has improved after right thoracentesis.
Overall bilateral lung volumes are improved. Heterogeneous opacities
throughout both lungs persist and are not significantly changed.
Left jugular Port-A-Cath is stable. There is no pneumothorax.
IMPRESSION: No pneumothorax post thoracentesis.

## 2016-04-25 IMAGING — DX DG CHEST 1V PORT
1 series · 1 of 1 positions shown · non-contrast
Comparison: 04/15/2016

CLINICAL DATA: Followup right pneumothorax. Metastatic right lung
carcinoma. Healthcare associated pneumonia and sepsis.

EXAM:
PORTABLE CHEST 1 VIEW

[chest ap]
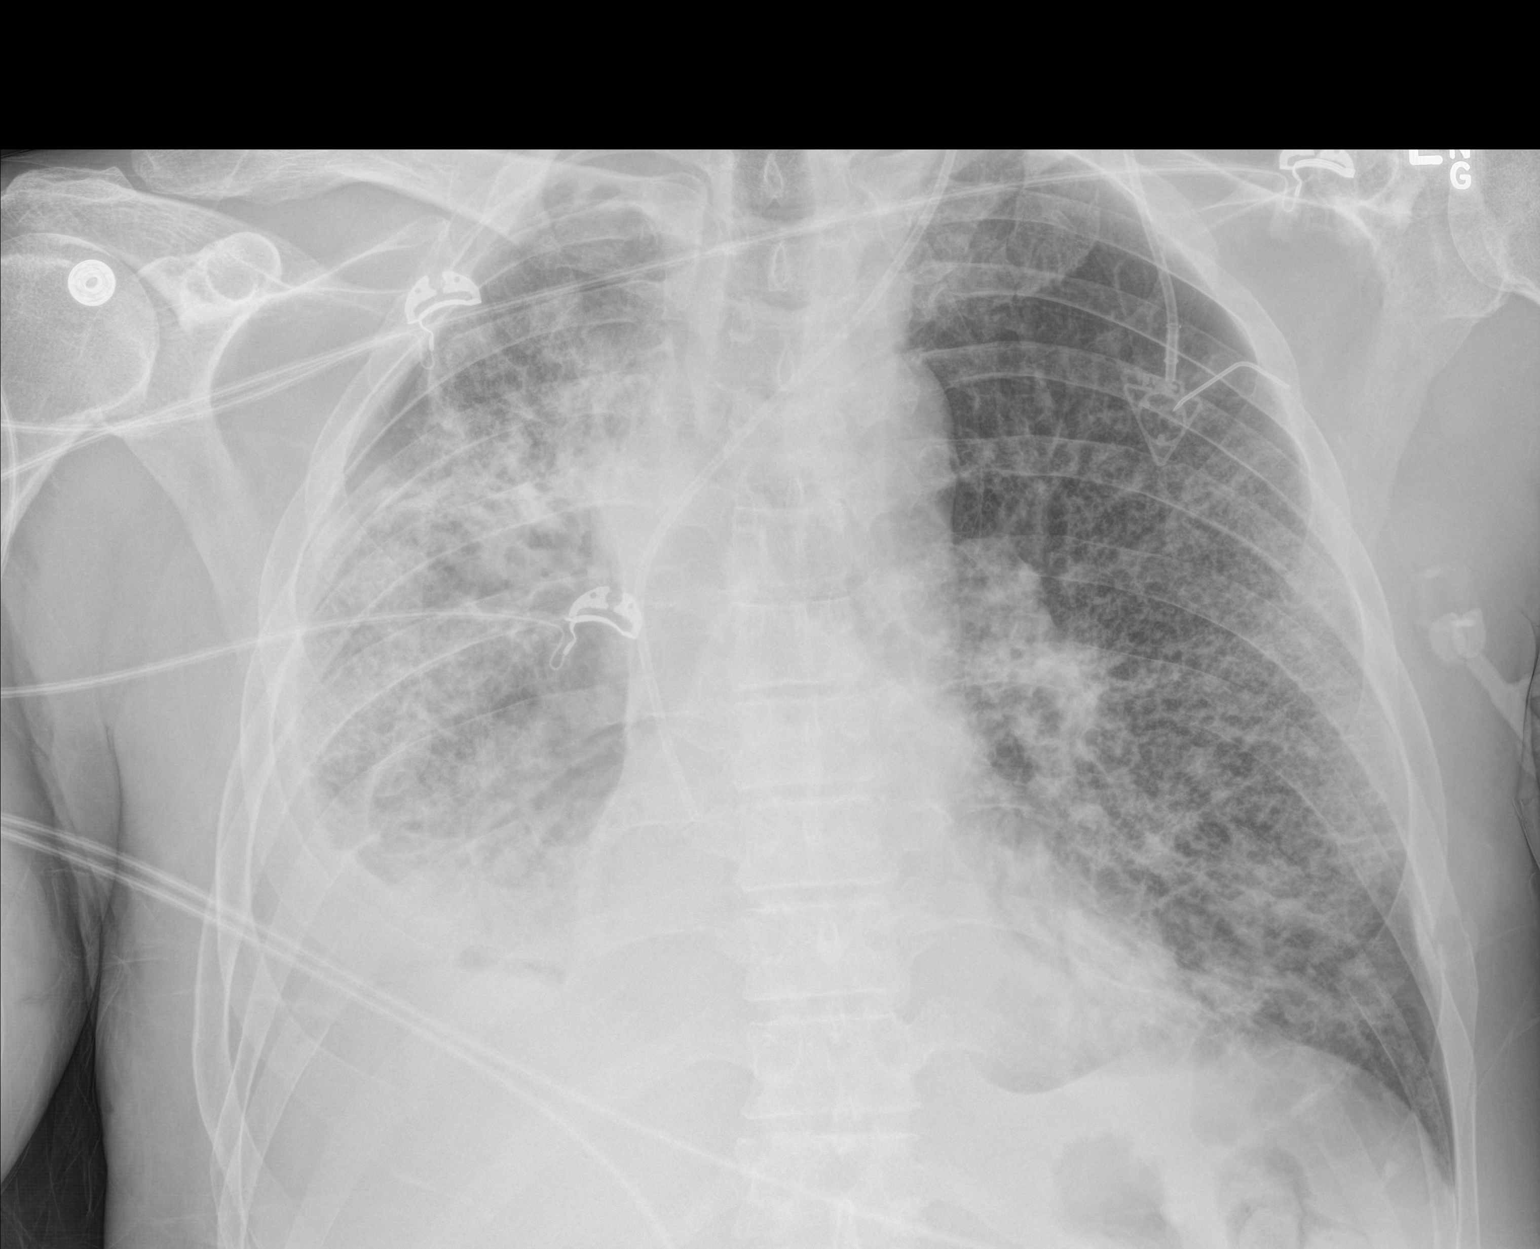

[1 of 1 positions shown; findings below may reference images not displayed]

FINDINGS: Tiny less than 10% right lateral pneumothorax again demonstrated as
well as small right pleural effusion. Asymmetric airspace disease is
again seen involving right lung greater than left, without
significant change. Heart size is stable. Left-sided power port
remains in appropriate position.
IMPRESSION: Stable small right hydropneumothorax. No significant change in
asymmetric airspace disease.

## 2016-04-25 IMAGING — CR DG CHEST 1V PORT
1 series · 1 of 1 positions shown · non-contrast
Comparison: 06/16/2015

CLINICAL DATA: Status post thoracentesis

EXAM:
PORTABLE CHEST - 1 VIEW

[AP]
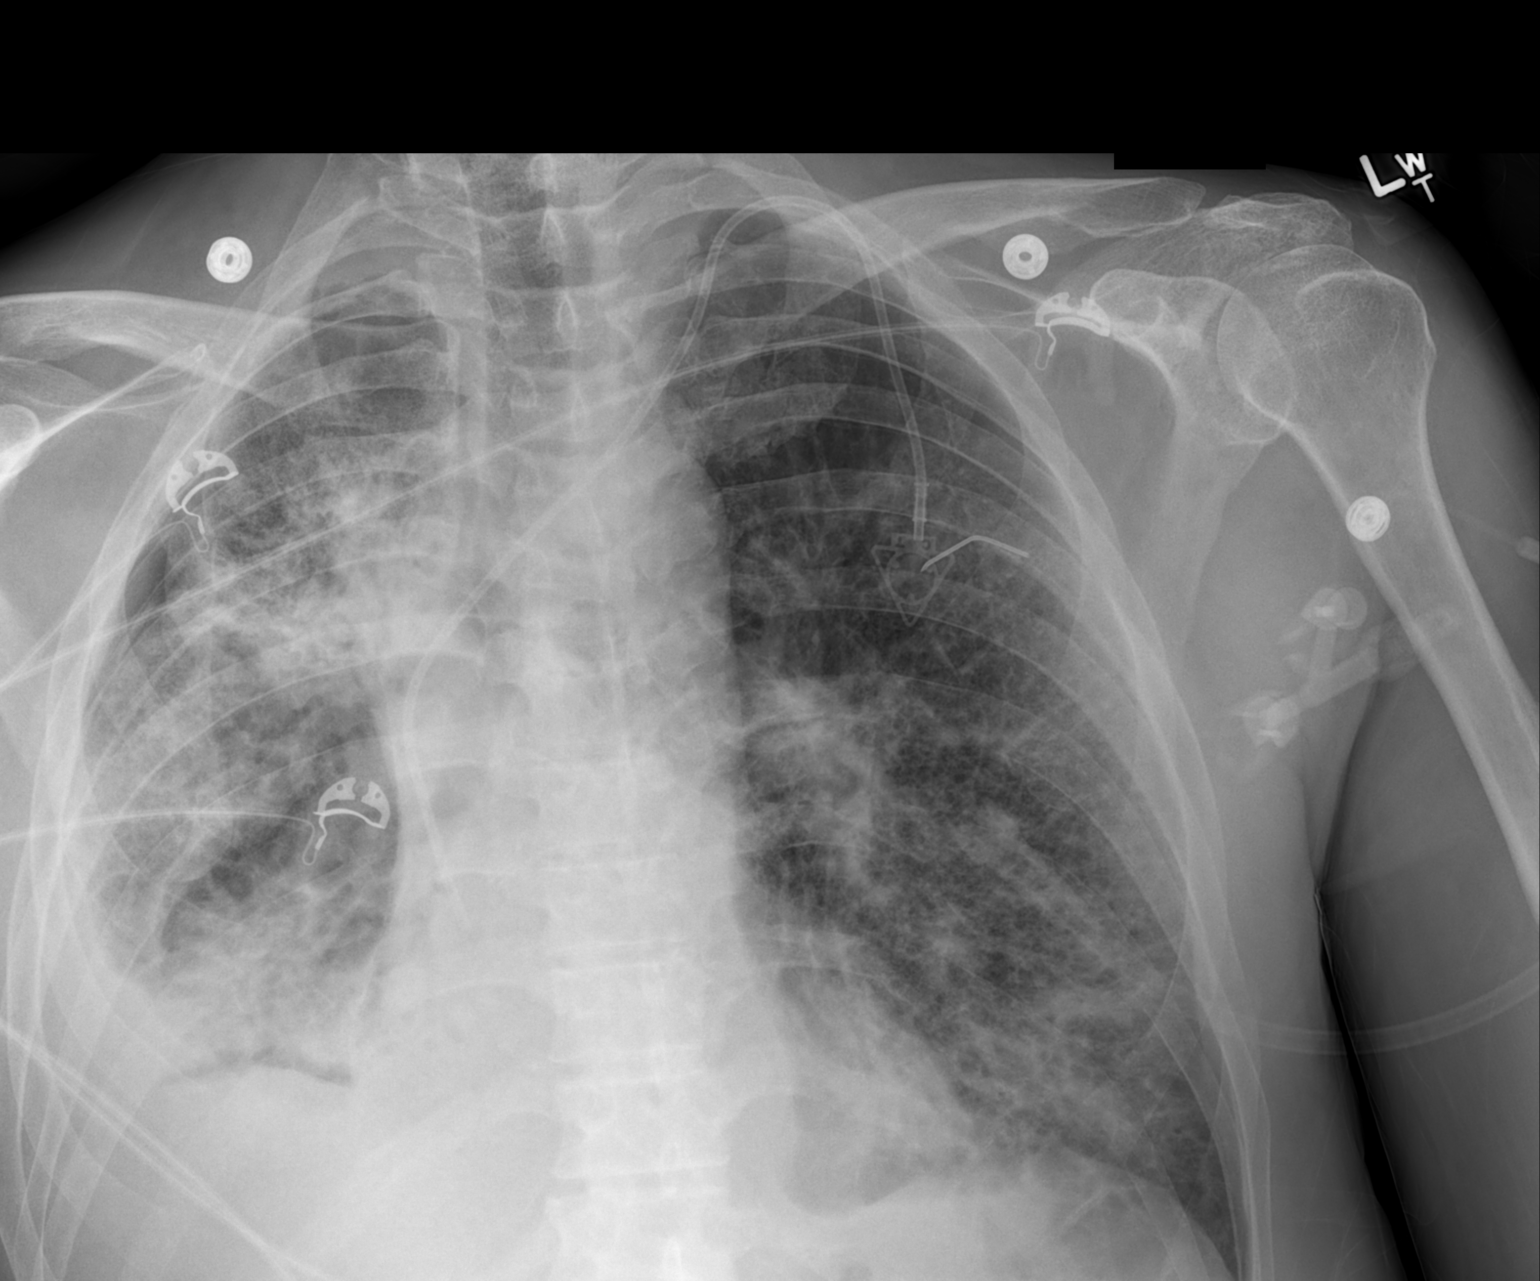

[1 of 1 positions shown; findings below may reference images not displayed]

FINDINGS: Cardiac shadow is stable. A left chest wall port is again seen.
There is been interval right-sided thoracentesis. Minimal right
lateral pneumothorax is noted. Diffuse infiltrates are noted
throughout the right lung as well as some diffuse interstitial
changes in the left lung. This is stable from the prior study.
IMPRESSION: Minimal lateral pneumothorax following thoracentesis. There has been
significant reduction in pleural fluid when compared with the prior
exam.

These results were called by telephone at the time of interpretation
on 06/16/2015 at [DATE] to Mae of the patients nurse, who verbally
acknowledged these results and will notify the performing physician.

## 2016-04-27 IMAGING — DX DG CHEST 1V PORT
1 series · 1 of 1 positions shown · non-contrast
Comparison: Two days ago

CLINICAL DATA: Acute respiratory failure with hypoxemia

EXAM:
PORTABLE CHEST 1 VIEW

[chest ap]
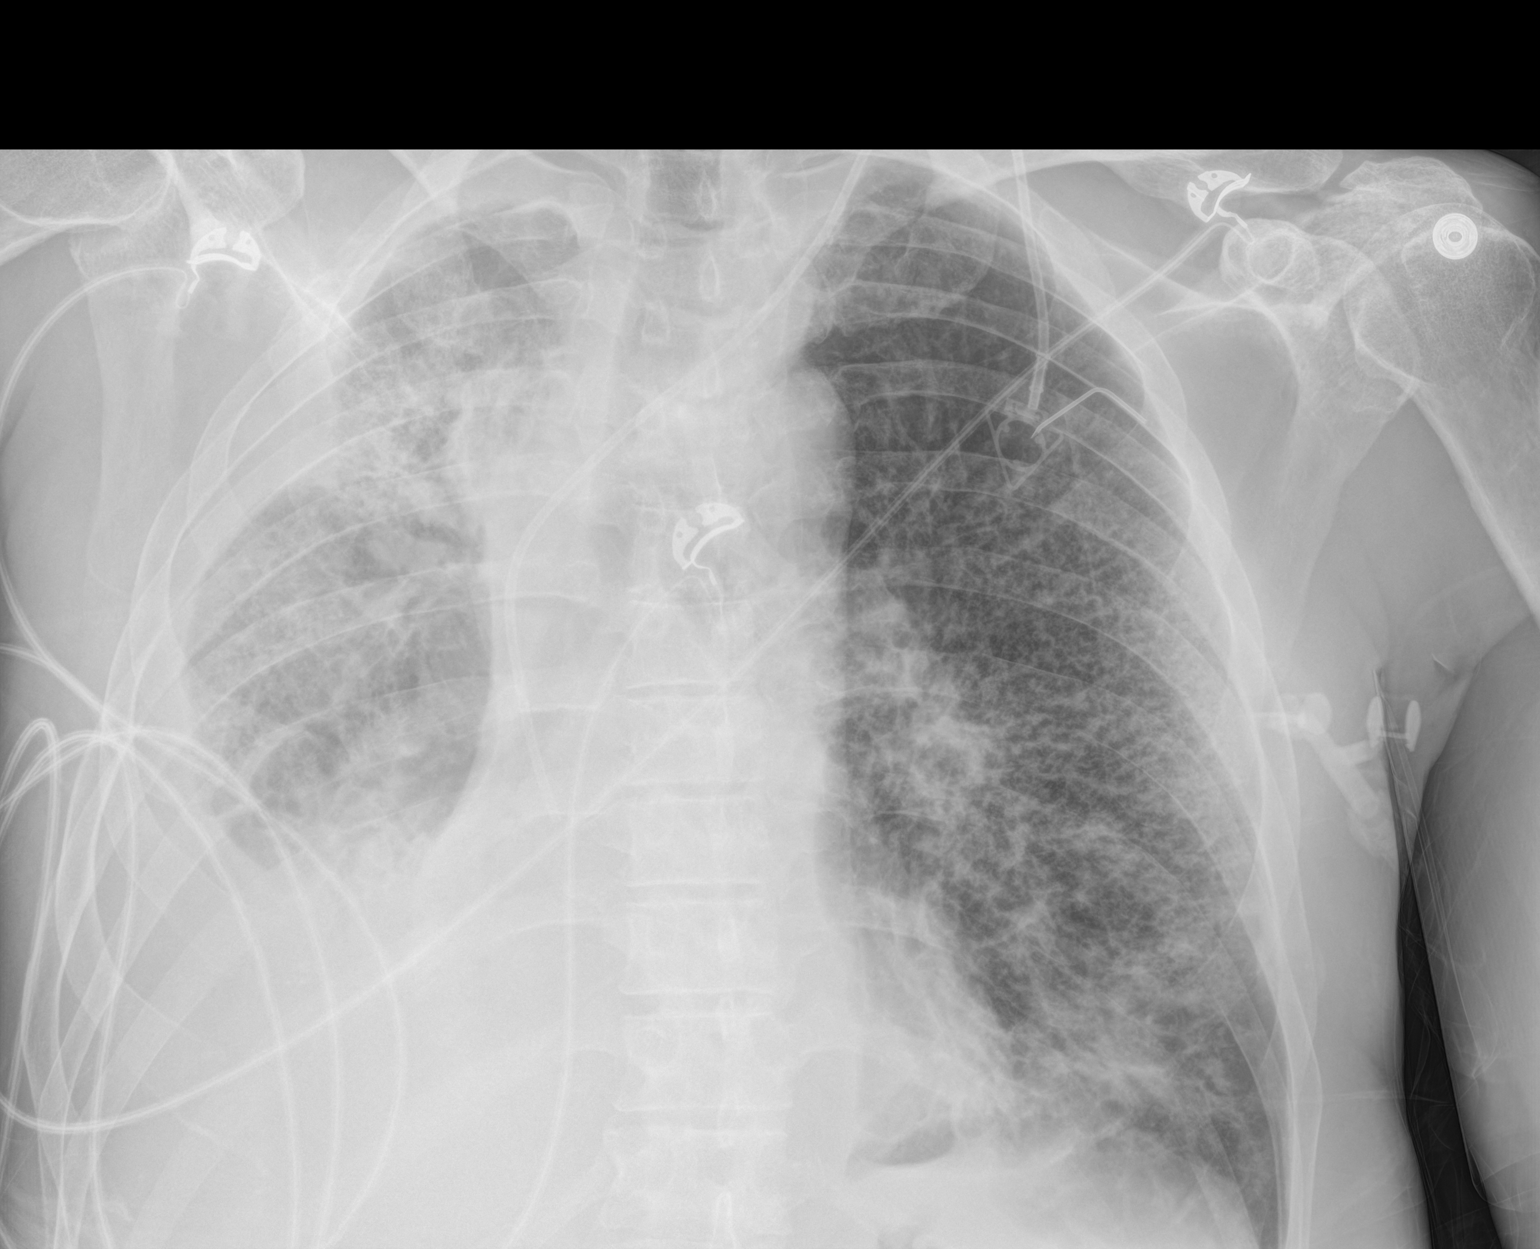

[1 of 1 positions shown; findings below may reference images not displayed]

FINDINGS: There is a left-sided porta catheter, tip in stable position.

Right pneumothorax is no longer visualized. Right pleural fluid has
re- accumulated. Bilateral airspace disease, more extensive on the
right. Underlying COPD with interstitial coarsening. The right lower
lobe is obstructed and collapse due to hilar mass. Normal heart
size.
IMPRESSION: 1. Increasing right pleural effusion. Right pneumothorax is no
longer seen.
2. Bilateral pneumonia. Postobstructive lung opacification on the
right.
